# Patient Record
Sex: Female | Born: 1995
Health system: Southern US, Community
[De-identification: ages and names within clinical notes are randomized; demographics above are authoritative.]

## PROBLEM LIST (undated history)

## (undated) DIAGNOSIS — D509 Iron deficiency anemia, unspecified: Secondary | ICD-10-CM

## (undated) DIAGNOSIS — I10 Essential (primary) hypertension: Secondary | ICD-10-CM

## (undated) HISTORY — DX: Iron deficiency anemia, unspecified: D50.9

## (undated) HISTORY — DX: Essential (primary) hypertension: I10

## (undated) HISTORY — PX: NO PAST SURGERIES: SHX2092

---
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
## 2016-04-08 DIAGNOSIS — Z113 Encounter for screening for infections with a predominantly sexual mode of transmission: Secondary | ICD-10-CM | POA: Diagnosis not present

## 2016-04-08 DIAGNOSIS — Z118 Encounter for screening for other infectious and parasitic diseases: Secondary | ICD-10-CM | POA: Diagnosis not present

## 2016-04-08 DIAGNOSIS — Z01419 Encounter for gynecological examination (general) (routine) without abnormal findings: Secondary | ICD-10-CM | POA: Diagnosis not present

## 2016-11-16 DIAGNOSIS — Z6841 Body Mass Index (BMI) 40.0 and over, adult: Secondary | ICD-10-CM | POA: Diagnosis not present

## 2016-11-16 DIAGNOSIS — R635 Abnormal weight gain: Secondary | ICD-10-CM | POA: Diagnosis not present

## 2016-11-16 DIAGNOSIS — I10 Essential (primary) hypertension: Secondary | ICD-10-CM | POA: Diagnosis not present

## 2016-11-17 MED FILL — QSYMIA 3.75 MG-23 MG CAP: 3.75-23 | 14 days supply | Qty: 14 | Fill #0

## 2016-12-07 MED FILL — PHENTERMINE 15 MG CAPSULE: 15 | 30 days supply | Qty: 30 | Fill #0

## 2016-12-27 DIAGNOSIS — I10 Essential (primary) hypertension: Secondary | ICD-10-CM | POA: Diagnosis not present

## 2016-12-27 DIAGNOSIS — Z6841 Body Mass Index (BMI) 40.0 and over, adult: Secondary | ICD-10-CM | POA: Diagnosis not present

## 2016-12-30 MED FILL — PHENTERMINE 37.5 MG TABLET: 37.5 | 30 days supply | Qty: 30 | Fill #0

## 2017-01-03 DIAGNOSIS — Z124 Encounter for screening for malignant neoplasm of cervix: Secondary | ICD-10-CM | POA: Diagnosis not present

## 2017-01-03 DIAGNOSIS — Z01419 Encounter for gynecological examination (general) (routine) without abnormal findings: Secondary | ICD-10-CM | POA: Diagnosis not present

## 2017-01-03 DIAGNOSIS — Z6841 Body Mass Index (BMI) 40.0 and over, adult: Secondary | ICD-10-CM | POA: Diagnosis not present

## 2017-01-03 DIAGNOSIS — Z309 Encounter for contraceptive management, unspecified: Secondary | ICD-10-CM | POA: Diagnosis not present

## 2017-01-03 MED FILL — NORETHIN-ESTRAD-FERR 1-0.02: 1-20 | 84 days supply | Qty: 84 | Fill #0

## 2017-01-06 MED FILL — metroNIDAZOLE 500 MG TABS: 500 | 7 days supply | Qty: 14 | Fill #0

## 2017-01-13 DIAGNOSIS — H52223 Regular astigmatism, bilateral: Secondary | ICD-10-CM | POA: Diagnosis not present

## 2017-02-08 MED FILL — PHENTERMINE 37.5 MG TABLET: 37.5 | 30 days supply | Qty: 30 | Fill #1

## 2017-03-01 DIAGNOSIS — Z6841 Body Mass Index (BMI) 40.0 and over, adult: Secondary | ICD-10-CM | POA: Diagnosis not present

## 2017-03-01 DIAGNOSIS — I10 Essential (primary) hypertension: Secondary | ICD-10-CM | POA: Diagnosis not present

## 2017-04-04 MED FILL — QSYMIA 7.5 MG-46 MG CAPSULE: 7.5-46 | 30 days supply | Qty: 30 | Fill #0

## 2017-04-05 MED FILL — NORETHIN-ESTRAD-FERR 1-0.02: 1-20 | 84 days supply | Qty: 84 | Fill #0

## 2017-05-16 DIAGNOSIS — I10 Essential (primary) hypertension: Secondary | ICD-10-CM | POA: Diagnosis not present

## 2017-05-16 DIAGNOSIS — Z6841 Body Mass Index (BMI) 40.0 and over, adult: Secondary | ICD-10-CM | POA: Diagnosis not present

## 2017-05-16 DIAGNOSIS — R51 Headache: Secondary | ICD-10-CM | POA: Diagnosis not present

## 2017-05-16 MED FILL — HYDROCHLOROTHIAZIDE 12.5 MG: 12.5 | 30 days supply | Qty: 30 | Fill #0

## 2017-05-16 MED FILL — QSYMIA 7.5 MG-46 MG CAPSULE: 7.5-46 | 30 days supply | Qty: 30 | Fill #0

## 2017-06-19 DIAGNOSIS — R87612 Low grade squamous intraepithelial lesion on cytologic smear of cervix (LGSIL): Secondary | ICD-10-CM | POA: Diagnosis not present

## 2017-06-19 DIAGNOSIS — Z3040 Encounter for surveillance of contraceptives, unspecified: Secondary | ICD-10-CM | POA: Diagnosis not present

## 2017-06-19 DIAGNOSIS — R87613 High grade squamous intraepithelial lesion on cytologic smear of cervix (HGSIL): Secondary | ICD-10-CM | POA: Diagnosis not present

## 2017-06-19 DIAGNOSIS — N898 Other specified noninflammatory disorders of vagina: Secondary | ICD-10-CM | POA: Diagnosis not present

## 2017-06-19 DIAGNOSIS — Z8249 Family history of ischemic heart disease and other diseases of the circulatory system: Secondary | ICD-10-CM | POA: Diagnosis not present

## 2017-06-19 DIAGNOSIS — I10 Essential (primary) hypertension: Secondary | ICD-10-CM | POA: Diagnosis not present

## 2017-06-19 MED FILL — DASETTA 1-35-28 TABLET: 1-35 | 28 days supply | Qty: 28 | Fill #0

## 2017-06-19 MED FILL — NORETHIN-ESTRAD-FERR 1-0.02: 1-20 | 84 days supply | Qty: 112 | Fill #0

## 2017-06-22 MED FILL — metroNIDAZOLE 0.75 % GEL: 0.75 | 5 days supply | Qty: 70 | Fill #0

## 2017-07-06 DIAGNOSIS — D509 Iron deficiency anemia, unspecified: Secondary | ICD-10-CM | POA: Diagnosis not present

## 2017-07-06 DIAGNOSIS — Z6841 Body Mass Index (BMI) 40.0 and over, adult: Secondary | ICD-10-CM | POA: Diagnosis not present

## 2017-07-06 DIAGNOSIS — I10 Essential (primary) hypertension: Secondary | ICD-10-CM | POA: Diagnosis not present

## 2017-08-29 DIAGNOSIS — Z712 Person consulting for explanation of examination or test findings: Secondary | ICD-10-CM | POA: Diagnosis not present

## 2017-08-29 DIAGNOSIS — N920 Excessive and frequent menstruation with regular cycle: Secondary | ICD-10-CM | POA: Diagnosis not present

## 2017-08-29 DIAGNOSIS — Z3041 Encounter for surveillance of contraceptive pills: Secondary | ICD-10-CM | POA: Diagnosis not present

## 2017-08-29 DIAGNOSIS — I10 Essential (primary) hypertension: Secondary | ICD-10-CM | POA: Diagnosis not present

## 2017-08-29 MED FILL — HYDROCHLOROTHIAZIDE 12.5 MG: 12.5 | 90 days supply | Qty: 90 | Fill #0

## 2017-11-08 MED FILL — PHENTERMINE 15 MG CAPSULE: 15 | 30 days supply | Qty: 30 | Fill #0

## 2017-12-13 MED FILL — PHENTERMINE 37.5 MG TABLET: 37.5 | 30 days supply | Qty: 30 | Fill #0

## 2018-01-09 MED FILL — LARIN FE 1-20 TABLET: 1-20 | 84 days supply | Qty: 84 | Fill #0

## 2018-01-10 MED FILL — PHENTERMINE 37.5 MG TABLET: 37.5 | 30 days supply | Qty: 30 | Fill #0

## 2018-02-12 MED FILL — AMOXICILLIN 500 MG CAPSULE: 500 | 7 days supply | Qty: 21 | Fill #0

## 2018-02-12 MED FILL — FLUCONAZOLE 150 MG TABLET: 150 | 1 days supply | Qty: 1 | Fill #0

## 2018-03-01 MED FILL — PHENTERMINE 37.5 MG TABLET: 37.5 | 30 days supply | Qty: 30 | Fill #1

## 2018-03-25 ENCOUNTER — Emergency Department (HOSPITAL_COMMUNITY): Admission: EM | Admit: 2018-03-25 | Discharge: 2018-03-25 | Discharge status: Against Medical Advice | Payer: Self-pay

## 2018-03-25 NOTE — ED Notes (Signed)
Pt decided she does not want to wait to be triaged. Pt. Wants to leave. I informed pt to return if she feels like her mouth injury gets worse

## 2018-04-09 MED FILL — PHENTERMINE 37.5 MG TABLET: 37.5 | 30 days supply | Qty: 30 | Fill #1

## 2018-04-26 MED FILL — IBUPROFEN 600 MG TABLET: 600 | 5 days supply | Qty: 20 | Fill #0

## 2018-05-09 LAB — HEPATIC FUNCTION PANEL
ALK PHOS: 76 (ref 25–125)
ALT: 6 — AB (ref 7–35)
AST: 11 — AB (ref 13–35)
BILIRUBIN, TOTAL: 0.4

## 2018-05-09 LAB — CBC AND DIFFERENTIAL
HEMATOCRIT: 35 — AB (ref 36–46)
HEMOGLOBIN: 10.8 — AB (ref 12.0–16.0)
NEUTROS ABS: 5
PLATELETS: 531 — AB (ref 150–399)
WBC: 7.5

## 2018-05-09 LAB — BASIC METABOLIC PANEL
BUN: 7 (ref 4–21)
CREATININE: 0.8 (ref 0.5–1.1)
Glucose: 82
Potassium: 4.1 (ref 3.4–5.3)
Sodium: 139 (ref 137–147)

## 2018-05-09 LAB — IRON,TIBC AND FERRITIN PANEL
Ferritin: 13
Iron: 52
TIBC: 328

## 2018-05-15 MED FILL — QSYMIA 7.5 MG-46 MG CAPSULE: 7.5-46 | 30 days supply | Qty: 30 | Fill #0

## 2018-09-07 MED FILL — QSYMIA 7.5 MG-46 MG CAPSULE: 7.5-46 | 30 days supply | Qty: 30 | Fill #1

## 2018-09-08 ENCOUNTER — Encounter: Payer: Self-pay | Admitting: Internal Medicine

## 2018-09-08 DIAGNOSIS — Z0000 Encounter for general adult medical examination without abnormal findings: Secondary | ICD-10-CM

## 2018-09-10 ENCOUNTER — Ambulatory Visit: Payer: Self-pay | Admitting: Internal Medicine

## 2018-09-13 ENCOUNTER — Ambulatory Visit (INDEPENDENT_AMBULATORY_CARE_PROVIDER_SITE_OTHER): Payer: No Typology Code available for payment source | Admitting: Internal Medicine

## 2018-09-13 ENCOUNTER — Encounter: Payer: Self-pay | Admitting: Internal Medicine

## 2018-09-13 VITALS — BP 122/78 | HR 79 | Temp 98.2°F | Ht 60.0 in | Wt 230.4 lb

## 2018-09-13 DIAGNOSIS — D500 Iron deficiency anemia secondary to blood loss (chronic): Secondary | ICD-10-CM

## 2018-09-13 DIAGNOSIS — R7309 Other abnormal glucose: Secondary | ICD-10-CM | POA: Diagnosis not present

## 2018-09-13 DIAGNOSIS — I10 Essential (primary) hypertension: Secondary | ICD-10-CM | POA: Diagnosis not present

## 2018-09-13 DIAGNOSIS — Z6841 Body Mass Index (BMI) 40.0 and over, adult: Secondary | ICD-10-CM

## 2018-09-13 NOTE — Patient Instructions (Signed)
Exercising to Lose Weight Exercising can help you to lose weight. In order to lose weight through exercise, you need to do vigorous-intensity exercise. You can tell that you are exercising with vigorous intensity if you are breathing very hard and fast and cannot hold a conversation while exercising. Moderate-intensity exercise helps to maintain your current weight. You can tell that you are exercising at a moderate level if you have a higher heart rate and faster breathing, but you are still able to hold a conversation. How often should I exercise? Choose an activity that you enjoy and set realistic goals. Your health care provider can help you to make an activity plan that works for you. Exercise regularly as directed by your health care provider. This may include:  Doing resistance training twice each week, such as: ? Push-ups. ? Sit-ups. ? Lifting weights. ? Using resistance bands.  Doing a given intensity of exercise for a given amount of time. Choose from these options: ? 150 minutes of moderate-intensity exercise every week. ? 75 minutes of vigorous-intensity exercise every week. ? A mix of moderate-intensity and vigorous-intensity exercise every week.  Children, pregnant women, people who are out of shape, people who are overweight, and older adults may need to consult a health care provider for individual recommendations. If you have any sort of medical condition, be sure to consult your health care provider before starting a new exercise program. What are some activities that can help me to lose weight?  Walking at a rate of at least 4.5 miles an hour.  Jogging or running at a rate of 5 miles per hour.  Biking at a rate of at least 10 miles per hour.  Lap swimming.  Roller-skating or in-line skating.  Cross-country skiing.  Vigorous competitive sports, such as football, basketball, and soccer.  Jumping rope.  Aerobic dancing. How can I be more active in my day-to-day  activities?  Use the stairs instead of the elevator.  Take a walk during your lunch break.  If you drive, park your car farther away from work or school.  If you take public transportation, get off one stop early and walk the rest of the way.  Make all of your phone calls while standing up and walking around.  Get up, stretch, and walk around every 30 minutes throughout the day. What guidelines should I follow while exercising?  Do not exercise so much that you hurt yourself, feel dizzy, or get very short of breath.  Consult your health care provider prior to starting a new exercise program.  Wear comfortable clothes and shoes with good support.  Drink plenty of water while you exercise to prevent dehydration or heat stroke. Body water is lost during exercise and must be replaced.  Work out until you breathe faster and your heart beats faster. This information is not intended to replace advice given to you by your health care provider. Make sure you discuss any questions you have with your health care provider. Document Released: 11/26/2010 Document Revised: 03/31/2016 Document Reviewed: 03/27/2014 Elsevier Interactive Patient Education  2018 Elsevier Inc.  

## 2018-09-13 NOTE — Progress Notes (Signed)
  Subjective:     Patient ID: Angel Byrd , female    DOB: [birth year] , 22 y.o.   MRN: [id number]   Chief Complaint  Patient presents with  . Hypertension    HPI  Hypertension  This is a chronic problem. The current episode started more than 1 month ago. The problem is unchanged. The problem is controlled. Pertinent negatives include no blurred vision, chest pain, headaches, neck pain, orthopnea, palpitations or shortness of breath.     History reviewed. No pertinent past medical history.   Family History  Problem Relation Age of Onset  . Sickle cell anemia Mother   . Heart disease Father   . Hypertension Sister      Current Outpatient Medications:  .  Fe Fum-Fe Poly-Vit C-Lactobac (FUSION PO), Take by mouth., Disp: , Rfl:  .  nebivolol (BYSTOLIC) 5 MG tablet, Take 5 mg by mouth daily., Disp: , Rfl:  .  Phentermine-Topiramate (QSYMIA) 7.5-46 MG CP24, Take by mouth., Disp: , Rfl:    No Known Allergies   Review of Systems  Constitutional: Negative.   HENT: Negative.   Eyes: Negative for blurred vision.  Respiratory: Negative.  Negative for shortness of breath.   Cardiovascular: Negative.  Negative for chest pain, palpitations and orthopnea.  Gastrointestinal: Negative.   Musculoskeletal: Negative for neck pain.  Neurological: Negative.  Negative for headaches.  Psychiatric/Behavioral: Negative.      Today's Vitals   09/13/18 1118  BP: 122/78  Pulse: 79  Temp: 98.2 F (36.8 C)  TempSrc: Oral  Weight: 230 lb 6.4 oz (104.5 kg)  Height: 5' (1.524 m)   Body mass index is 45 kg/m.   Objective:  Physical Exam  Constitutional: She is oriented to person, place, and time. She appears well-developed and well-nourished.  HENT:  Head: Normocephalic and atraumatic.  Cardiovascular: Normal rate, regular rhythm and normal heart sounds.  Pulmonary/Chest: Effort normal and breath sounds normal.  Neurological: She is alert and oriented to person, place, and time.   Skin: Skin is warm and dry.  Psychiatric: She has a normal mood and affect.  Nursing note and vitals reviewed.       Assessment And Plan:     1. Essential hypertension, benign  Well controlled. She will continue with current meds. She will RTO in six months for a full physical examination.   - CMP14+EGFR - Hemoglobin A1c  2. Iron deficiency anemia due to chronic blood loss  I will check her iron studies today. Unfortunately, I do not think she has been compliant with oral iron supplementation.   - CBC no Diff - Iron and IBC ([id number])  3. Other abnormal glucose  HER A1C HAS BEEN ELEVATED IN THE PAST. I WILL CHECK AN A1C, BMET TODAY. SHE WAS ENCOURAGED TO AVOID SUGARY BEVERAGES AND PROCESSED FOODS INCLUDNG BREADS, RICE AND PASTA.   4. Class 3 severe obesity due to excess calories with serious comorbidity and body mass index (BMI) of 40.0 to 44.9 in adult [facility])  She is interested in bariatric surgery. She is encouraged to contact [facility] to sign up for their orientation class.   She is encouraged to strive for BMI less than 35 to decrease cardiac risk    [name], MD

## 2018-09-14 LAB — CMP14+EGFR
ALK PHOS: 80 IU/L (ref 39–117)
ALT: 7 IU/L (ref 0–32)
AST: 11 IU/L (ref 0–40)
Albumin/Globulin Ratio: 1.3 (ref 1.2–2.2)
Albumin: 4.4 g/dL (ref 3.5–5.5)
BILIRUBIN TOTAL: 0.2 mg/dL (ref 0.0–1.2)
BUN/Creatinine Ratio: 11 (ref 9–23)
BUN: 9 mg/dL (ref 6–20)
CHLORIDE: 107 mmol/L — AB (ref 96–106)
CO2: 17 mmol/L — ABNORMAL LOW (ref 20–29)
Calcium: 9.1 mg/dL (ref 8.7–10.2)
Creatinine, Ser: 0.84 mg/dL (ref 0.57–1.00)
GFR calc Af Amer: 113 mL/min/{1.73_m2} (ref >59)
GFR calc non Af Amer: 98 mL/min/{1.73_m2} (ref >59)
GLUCOSE: 81 mg/dL (ref 65–99)
Globulin, Total: 3.3 g/dL (ref 1.5–4.5)
Potassium: 4.1 mmol/L (ref 3.5–5.2)
Sodium: 141 mmol/L (ref 134–144)
Total Protein: 7.7 g/dL (ref 6.0–8.5)

## 2018-09-14 LAB — IRON AND TIBC
IRON SATURATION: 7 % — AB (ref 15–55)
IRON: 22 ug/dL — AB (ref 27–159)
Total Iron Binding Capacity: 320 ug/dL (ref 250–450)
UIBC: 298 ug/dL (ref 131–425)

## 2018-09-14 LAB — CBC
Hematocrit: 31.8 % — ABNORMAL LOW (ref 34.0–46.6)
Hemoglobin: 9.8 g/dL — ABNORMAL LOW (ref 11.1–15.9)
MCH: 22.7 pg — AB (ref 26.6–33.0)
MCHC: 30.8 g/dL — ABNORMAL LOW (ref 31.5–35.7)
MCV: 74 fL — ABNORMAL LOW (ref 79–97)
PLATELETS: 559 10*3/uL — AB (ref 150–450)
RBC: 4.32 x10E6/uL (ref 3.77–5.28)
RDW: 15.3 % (ref 12.3–15.4)
WBC: 10.8 10*3/uL (ref 3.4–10.8)

## 2018-09-14 LAB — HEMOGLOBIN A1C
Est. average glucose Bld gHb Est-mCnc: 114 mg/dL
HEMOGLOBIN A1C: 5.6 % (ref 4.8–5.6)

## 2018-09-15 ENCOUNTER — Telehealth: Payer: Self-pay

## 2018-09-15 NOTE — Telephone Encounter (Signed)
Called the pt with her lab results and she hung up and said she will call back.

## 2018-09-15 NOTE — Telephone Encounter (Signed)
-----   Message from Glendale Chard, MD sent at 09/14/2018  8:46 PM EST ----- Your chloride levels are elevated - pls increase water intake. Your hba1c is 5.6, you are not prediabetic. Your blood count is lower than last visit. Your platelets are extremely elevated. Your iron levels are quite low. I would like to refer you to a blood specialist, hematologist. If your platelet levels get too high, it can increase your risk of clotting. KW-if she agrees, let me know and I will place referral.thx

## 2018-09-16 ENCOUNTER — Encounter: Payer: Self-pay | Admitting: Internal Medicine

## 2018-09-19 ENCOUNTER — Other Ambulatory Visit: Payer: Self-pay | Admitting: Internal Medicine

## 2018-09-19 DIAGNOSIS — D473 Essential (hemorrhagic) thrombocythemia: Secondary | ICD-10-CM

## 2018-09-19 DIAGNOSIS — D500 Iron deficiency anemia secondary to blood loss (chronic): Secondary | ICD-10-CM

## 2018-09-19 DIAGNOSIS — D75839 Thrombocytosis, unspecified: Secondary | ICD-10-CM

## 2018-09-24 ENCOUNTER — Telehealth: Payer: Self-pay | Admitting: Family

## 2018-09-24 NOTE — Telephone Encounter (Signed)
lmom for pt to return call to office re New Patient appt. appt letter mailed 11/18

## 2018-10-16 ENCOUNTER — Other Ambulatory Visit: Payer: Self-pay

## 2018-10-16 ENCOUNTER — Ambulatory Visit: Payer: Self-pay

## 2018-10-16 ENCOUNTER — Ambulatory Visit: Payer: Self-pay | Admitting: Family

## 2018-10-19 ENCOUNTER — Other Ambulatory Visit: Payer: Self-pay | Admitting: Family

## 2018-10-19 ENCOUNTER — Telehealth: Payer: Self-pay | Admitting: Family

## 2018-10-19 DIAGNOSIS — D649 Anemia, unspecified: Secondary | ICD-10-CM

## 2018-10-19 NOTE — Telephone Encounter (Signed)
Returned call to patient requesting to cancel 12/16 NP appt.  I LMVM for her advising that appts were cancelled and she needed to CB to r/s

## 2018-10-22 ENCOUNTER — Inpatient Hospital Stay: Payer: Self-pay

## 2018-10-22 ENCOUNTER — Inpatient Hospital Stay: Payer: Self-pay | Admitting: Family

## 2018-10-22 ENCOUNTER — Ambulatory Visit: Payer: Self-pay

## 2018-11-13 ENCOUNTER — Ambulatory Visit (INDEPENDENT_AMBULATORY_CARE_PROVIDER_SITE_OTHER): Payer: No Typology Code available for payment source | Admitting: Internal Medicine

## 2018-11-13 ENCOUNTER — Encounter: Payer: Self-pay | Admitting: Internal Medicine

## 2018-11-13 VITALS — BP 122/84 | HR 70 | Temp 98.3°F | Ht 60.0 in | Wt 235.4 lb

## 2018-11-13 DIAGNOSIS — Z79899 Other long term (current) drug therapy: Secondary | ICD-10-CM

## 2018-11-13 DIAGNOSIS — I10 Essential (primary) hypertension: Secondary | ICD-10-CM

## 2018-11-13 DIAGNOSIS — Z6841 Body Mass Index (BMI) 40.0 and over, adult: Secondary | ICD-10-CM

## 2018-11-13 DIAGNOSIS — Z9114 Patient's other noncompliance with medication regimen: Secondary | ICD-10-CM

## 2018-11-13 DIAGNOSIS — D509 Iron deficiency anemia, unspecified: Secondary | ICD-10-CM | POA: Diagnosis not present

## 2018-11-13 LAB — POCT URINE PREGNANCY: Preg Test, Ur: NEGATIVE

## 2018-11-13 MED ORDER — PHENTERMINE-TOPIRAMATE 11.25-69 MG PO CP24
11.2500 mg | ORAL_CAPSULE | Freq: Every day | ORAL | 1 refills | Status: DC
Start: 2018-11-13 — End: 2018-11-30

## 2018-11-13 NOTE — Patient Instructions (Signed)
Iron Deficiency Anemia, Adult  Iron-deficiency anemia is when you have a low amount of red blood cells or hemoglobin. This happens because you have too little iron in your body. Hemoglobin carries oxygen to parts of the body. Anemia can cause your body to not get enough oxygen. It may or may not cause symptoms.  Follow these instructions at home:  Medicines  · Take over-the-counter and prescription medicines only as told by your doctor. This includes iron pills (supplements) and vitamins.  · If you cannot handle taking iron pills by mouth, ask your doctor about getting iron through:  ? A vein (intravenously).  ? A shot (injection) into a muscle.  · Take iron pills when your stomach is empty. If you cannot handle this, take them with food.  · Do not drink milk or take antacids at the same time as your iron pills.  · To prevent trouble pooping (constipation), eat fiber or take medicine (stool softener) as told by your doctor.  Eating and drinking    · Talk with your doctor before changing the foods you eat. He or she may tell you to eat foods that have a lot of iron, such as:  ? Liver.  ? Lowfat (lean) beef.  ? Breads and cereals that have iron added to them (fortified breads and cereals).  ? Eggs.  ? Dried fruit.  ? Dark green, leafy vegetables.  · Drink enough fluid to keep your pee (urine) clear or pale yellow.  · Eat fresh fruits and vegetables that are high in vitamin C. They help your body to use iron. Foods with a lot of vitamin C include:  ? Oranges.  ? Peppers.  ? Tomatoes.  ? Mangoes.  General instructions  · Return to your normal activities as told by your doctor. Ask your doctor what activities are safe for you.  · Keep yourself clean, and keep things clean around you (your surroundings). Anemia can make you get sick more easily.  · Keep all follow-up visits as told by your doctor. This is important.  Contact a doctor if:  · You feel sick to your stomach (nauseous).  · You throw up (vomit).  · You feel  weak.  · You are sweating for no clear reason.  · You have trouble pooping, such as:  ? Pooping (having a bowel movement) less than 3 times a week.  ? Straining to poop.  ? Having poop that is hard, dry, or larger than normal.  ? Feeling full or bloated.  ? Pain in the lower belly.  ? Not feeling better after pooping.  Get help right away if:  · You pass out (faint). If this happens, do not drive yourself to the hospital. Call your local emergency services (911 in the [place]  · You have chest pain.  · You have shortness of breath that:  ? Is very bad.  ? Gets worse with physical activity.  · You have a fast heartbeat.  · You get light-headed when getting up from sitting or lying down.  This information is not intended to replace advice given to you by your health care provider. Make sure you discuss any questions you have with your health care provider.  Document Released: 11/26/2010 Document Revised: 07/13/2016 Document Reviewed: 07/13/2016  Elsevier Interactive Patient Education © 2019 Elsevier Inc.

## 2018-11-14 LAB — CBC
HEMOGLOBIN: 9.7 g/dL — AB (ref 11.1–15.9)
Hematocrit: 30.6 % — ABNORMAL LOW (ref 34.0–46.6)
MCH: 22.2 pg — ABNORMAL LOW (ref 26.6–33.0)
MCHC: 31.7 g/dL (ref 31.5–35.7)
MCV: 70 fL — AB (ref 79–97)
PLATELETS: 590 10*3/uL — AB (ref 150–450)
RBC: 4.37 x10E6/uL (ref 3.77–5.28)
RDW: 16.8 % — ABNORMAL HIGH (ref 11.7–15.4)
WBC: 8.3 10*3/uL (ref 3.4–10.8)

## 2018-11-14 LAB — IRON AND TIBC
IRON: 34 ug/dL (ref 27–159)
Iron Saturation: 11 % — ABNORMAL LOW (ref 15–55)
TIBC: 306 ug/dL (ref 250–450)
UIBC: 272 ug/dL (ref 131–425)

## 2018-11-14 LAB — CMP14+EGFR
A/G RATIO: 1.3 (ref 1.2–2.2)
ALK PHOS: 80 IU/L (ref 39–117)
ALT: 5 IU/L (ref 0–32)
AST: 8 IU/L (ref 0–40)
Albumin: 4.2 g/dL (ref 3.5–5.5)
BUN/Creatinine Ratio: 7 — ABNORMAL LOW (ref 9–23)
BUN: 5 mg/dL — ABNORMAL LOW (ref 6–20)
Bilirubin Total: 0.4 mg/dL (ref 0.0–1.2)
CO2: 21 mmol/L (ref 20–29)
Calcium: 9 mg/dL (ref 8.7–10.2)
Chloride: 102 mmol/L (ref 96–106)
Creatinine, Ser: 0.75 mg/dL (ref 0.57–1.00)
GFR calc Af Amer: 129 mL/min/{1.73_m2} (ref >59)
GFR calc non Af Amer: 112 mL/min/{1.73_m2} (ref >59)
GLOBULIN, TOTAL: 3.3 g/dL (ref 1.5–4.5)
Glucose: 74 mg/dL (ref 65–99)
POTASSIUM: 4.1 mmol/L (ref 3.5–5.2)
SODIUM: 139 mmol/L (ref 134–144)
Total Protein: 7.5 g/dL (ref 6.0–8.5)

## 2018-11-14 LAB — FERRITIN: FERRITIN: 10 ng/mL — AB (ref 15–150)

## 2018-11-14 LAB — HIV ANTIBODY (ROUTINE TESTING W REFLEX): HIV Screen 4th Generation wRfx: NONREACTIVE

## 2018-11-14 LAB — HEMOGLOBIN A1C
Est. average glucose Bld gHb Est-mCnc: 111 mg/dL
HEMOGLOBIN A1C: 5.5 % (ref 4.8–5.6)

## 2018-11-25 ENCOUNTER — Encounter: Payer: Self-pay | Admitting: Internal Medicine

## 2018-11-25 NOTE — Progress Notes (Signed)
  Subjective:     Patient ID: Angel Byrd , female    DOB: Apr 20, 1996 , 23 y.o.   MRN: [id number]   Chief Complaint  Patient presents with  . Hypertension    HPI  Hypertension  This is a chronic problem. The current episode started more than 1 year ago. The problem has been gradually improving since onset. The problem is controlled. Pertinent negatives include no blurred vision or chest pain.   she reports compliance with meds.   Past Medical History:  Diagnosis Date  . HTN (hypertension)   . Iron deficiency anemia      Family History  Problem Relation Age of Onset  . Sickle cell anemia Mother   . Heart disease Father   . Hypertension Sister      Current Outpatient Medications:  .  Fe Fum-Fe Poly-Vit C-Lactobac (FUSION PO), Take by mouth., Disp: , Rfl:  .  nebivolol (BYSTOLIC) 5 MG tablet, Take 5 mg by mouth daily., Disp: , Rfl:  .  Phentermine-Topiramate (QSYMIA) 11.25-69 MG CP24, Take 11.25 mg by mouth daily., Disp: 30 capsule, Rfl: 1   No Known Allergies   Review of Systems  Constitutional: Negative.   Eyes: Negative for blurred vision.  Respiratory: Negative.   Cardiovascular: Negative.  Negative for chest pain.  Gastrointestinal: Negative.   Neurological: Negative.   Psychiatric/Behavioral: Negative.      Today's Vitals   11/13/18 1123  BP: 122/84  Pulse: 70  Temp: 98.3 F (36.8 C)  TempSrc: Oral  Weight: 235 lb 6.4 oz (106.8 kg)  Height: 5' (1.524 m)   Body mass index is 45.97 kg/m.   Objective:  Physical Exam Vitals signs and nursing note reviewed.  Constitutional:      Appearance: Normal appearance. She is obese.  HENT:     Head: Normocephalic and atraumatic.  Cardiovascular:     Rate and Rhythm: Normal rate and regular rhythm.     Heart sounds: Normal heart sounds.  Pulmonary:     Effort: Pulmonary effort is normal.     Breath sounds: Normal breath sounds.  Skin:    General: Skin is warm.  Neurological:     Mental Status: She is  alert.  Psychiatric:        Mood and Affect: Mood normal.         Assessment And Plan:     1. Essential hypertension, benign  Well controlled. She will continue with current meds. She is encouraged to avoid adding salt to her foods.   - CMP14+EGFR - Hemoglobin A1c - HIV antibody (with reflex)  2. Iron deficiency anemia, unspecified iron deficiency anemia type  I will check labs as listed below. Unfortunately, she has not been compliant with oral iron supplementation. She may benefit from iron infusion. I will refer her to hematology for further evaluation.   - CBC no Diff - Iron and IBC ([id number]) - Ferritin - Ambulatory referral to Hematology  3. Class 3 severe obesity due to excess calories with serious comorbidity and body mass index (BMI) of 45.0 to 49.9 in adult [facility])  She has not lost any weight since her last visit. She is encouraged to incorporate more exercise into her daily routine. She is advised to exercise 30 minutes five days weekly. I will also increase the dose of her Qsymia.   4. Long term use of drug  - POCT urine pregnancy        [name], MD

## 2018-11-27 ENCOUNTER — Telehealth: Payer: Self-pay | Admitting: Family

## 2018-11-27 NOTE — Telephone Encounter (Signed)
Spoke with patient to confirm new patient appt 12/10/2018 at 1030 am

## 2018-11-30 ENCOUNTER — Other Ambulatory Visit: Payer: Self-pay

## 2018-11-30 ENCOUNTER — Other Ambulatory Visit: Payer: Self-pay | Admitting: Internal Medicine

## 2018-11-30 MED ORDER — PHENTERMINE-TOPIRAMATE 11.25-69 MG PO CP24
11.2500 mg | ORAL_CAPSULE | Freq: Every day | ORAL | 1 refills | Status: DC
Start: 2018-11-30 — End: 2018-12-31

## 2018-11-30 NOTE — Telephone Encounter (Signed)
The pt needs her prescription for qsymia sent to the  [facility] because she can't use her [employer] card a [facility].

## 2018-12-10 ENCOUNTER — Inpatient Hospital Stay: Payer: Self-pay

## 2018-12-10 ENCOUNTER — Inpatient Hospital Stay: Payer: Self-pay | Admitting: Family

## 2018-12-11 ENCOUNTER — Telehealth: Payer: Self-pay

## 2018-12-11 NOTE — Telephone Encounter (Signed)
Approved for qsymia

## 2018-12-31 ENCOUNTER — Inpatient Hospital Stay (HOSPITAL_BASED_OUTPATIENT_CLINIC_OR_DEPARTMENT_OTHER): Payer: No Typology Code available for payment source | Admitting: Family

## 2018-12-31 ENCOUNTER — Encounter: Payer: Self-pay | Admitting: Family

## 2018-12-31 ENCOUNTER — Inpatient Hospital Stay: Payer: No Typology Code available for payment source | Attending: Hematology & Oncology

## 2018-12-31 ENCOUNTER — Other Ambulatory Visit: Payer: Self-pay

## 2018-12-31 VITALS — BP 125/78 | HR 94 | Temp 98.5°F | Resp 18 | Ht 60.0 in | Wt 227.0 lb

## 2018-12-31 DIAGNOSIS — Z8249 Family history of ischemic heart disease and other diseases of the circulatory system: Secondary | ICD-10-CM | POA: Diagnosis not present

## 2018-12-31 DIAGNOSIS — R1310 Dysphagia, unspecified: Secondary | ICD-10-CM | POA: Insufficient documentation

## 2018-12-31 DIAGNOSIS — D573 Sickle-cell trait: Secondary | ICD-10-CM | POA: Insufficient documentation

## 2018-12-31 DIAGNOSIS — Z79899 Other long term (current) drug therapy: Secondary | ICD-10-CM

## 2018-12-31 DIAGNOSIS — D509 Iron deficiency anemia, unspecified: Secondary | ICD-10-CM | POA: Insufficient documentation

## 2018-12-31 DIAGNOSIS — D500 Iron deficiency anemia secondary to blood loss (chronic): Secondary | ICD-10-CM

## 2018-12-31 DIAGNOSIS — D649 Anemia, unspecified: Secondary | ICD-10-CM

## 2018-12-31 LAB — CBC WITH DIFFERENTIAL (CANCER CENTER ONLY)
Abs Immature Granulocytes: 0.02 10*3/uL (ref 0.00–0.07)
BASOS ABS: 0 10*3/uL (ref 0.0–0.1)
Basophils Relative: 1 %
EOS ABS: 0.1 10*3/uL (ref 0.0–0.5)
Eosinophils Relative: 1 %
HCT: 33.2 % — ABNORMAL LOW (ref 36.0–46.0)
Hemoglobin: 10.2 g/dL — ABNORMAL LOW (ref 12.0–15.0)
IMMATURE GRANULOCYTES: 0 %
Lymphocytes Relative: 31 %
Lymphs Abs: 2.4 10*3/uL (ref 0.7–4.0)
MCH: 22.3 pg — ABNORMAL LOW (ref 26.0–34.0)
MCHC: 30.7 g/dL (ref 30.0–36.0)
MCV: 72.5 fL — ABNORMAL LOW (ref 80.0–100.0)
MONOS PCT: 5 %
Monocytes Absolute: 0.4 10*3/uL (ref 0.1–1.0)
NEUTROS ABS: 4.8 10*3/uL (ref 1.7–7.7)
Neutrophils Relative %: 62 %
Platelet Count: 561 10*3/uL — ABNORMAL HIGH (ref 150–400)
RBC: 4.58 MIL/uL (ref 3.87–5.11)
RDW: 16.9 % — AB (ref 11.5–15.5)
WBC: 7.7 10*3/uL (ref 4.0–10.5)
nRBC: 0 % (ref 0.0–0.2)

## 2018-12-31 LAB — CMP (CANCER CENTER ONLY)
ALBUMIN: 4.6 g/dL (ref 3.5–5.0)
ALK PHOS: 71 U/L (ref 38–126)
ALT: 6 U/L (ref 0–44)
AST: 9 U/L — AB (ref 15–41)
Anion gap: 9 (ref 5–15)
BILIRUBIN TOTAL: 0.3 mg/dL (ref 0.3–1.2)
BUN: 8 mg/dL (ref 6–20)
CALCIUM: 9.3 mg/dL (ref 8.9–10.3)
CO2: 21 mmol/L — AB (ref 22–32)
CREATININE: 0.98 mg/dL (ref 0.44–1.00)
Chloride: 108 mmol/L (ref 98–111)
GFR, Est AFR Am: >60 mL/min (ref >60)
GFR, Estimated: >60 mL/min (ref >60)
Glucose, Bld: 94 mg/dL (ref 70–99)
Potassium: 3.4 mmol/L — ABNORMAL LOW (ref 3.5–5.1)
SODIUM: 138 mmol/L (ref 135–145)
TOTAL PROTEIN: 7.5 g/dL (ref 6.5–8.1)

## 2018-12-31 LAB — RETICULOCYTES
Immature Retic Fract: 9.2 % (ref 2.3–15.9)
RBC.: 4.58 MIL/uL (ref 3.87–5.11)
RETIC CT PCT: 0.7 % (ref 0.4–3.1)
Retic Count, Absolute: 32.5 10*3/uL (ref 19.0–186.0)

## 2018-12-31 LAB — SAVE SMEAR (SSMR)

## 2018-12-31 LAB — LACTATE DEHYDROGENASE: LDH: 125 U/L (ref 98–192)

## 2018-12-31 MED ORDER — FOLIC ACID 1 MG PO TABS: 1.0000 mg | ORAL_TABLET | Freq: Every day | ORAL | 11 refills | Status: DC

## 2018-12-31 MED FILL — FOLIC ACID 1 MG TABS: 1 | 30 days supply | Qty: 30 | Fill #0

## 2018-12-31 NOTE — Progress Notes (Signed)
Hematology/Oncology Consultation   Name: Angel Byrd      MRN: [id number]    Location: Room/bed info not found  Date: 12/31/2018 Time:1:22 PM   REFERRING PHYSICIAN: Glendale Chard, MD  REASON FOR CONSULT: Iron deficiency anemia    DIAGNOSIS: Iron deficiency anemia   HISTORY OF PRESENT ILLNESS: Ms. Phung is a very pleasant 23 yo African American female with iron deficiency anemia as well as the sickle cell trait.  Her iron saturation in January was 11% and ferritin 10.  She is currently on Fusion Plus iron supplement daily but has trouble swallowing this. She is not on folic acid.  She is symptomatic with fatigue and chills.  Her cycles is regular but heavy 4 out of 5 days. No other bleeding, no bruising or petechiae.  She has never been pregnant, no miscarriages.  Her mother had sickle cell disease.  Her sister also has IDA and the sickle cell trait.  No personal history of cancer. Family history includes: maternal grandmother lung (smoker), maternal uncle - colon and maternal uncle - bone.  She states that she has never had surgery.  No fever, chills, n/v, cough, rash, dizziness, chest pain, palpitations, abdominal pain or changes in bowel or bladder habits.  No swelling, tenderness, numbness or tingling in her extremities.  No falls or syncopal episodes.  No lymphadenopathy.  She has maintained a good appetite and is staying well hydrated. Her weight is stable.  She does not smoke or drink alcoholic beverages.  She walks daily with her dog for exercise.  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   ROS: All other 10 point review of systems is negative.   PAST MEDICAL HISTORY:   Past Medical History:  Diagnosis Date  . HTN (hypertension)   . Iron deficiency anemia     ALLERGIES: Allergies  Allergen Reactions  . Other       MEDICATIONS:  Current Outpatient Medications on File Prior to Visit  Medication Sig Dispense Refill  . Fe Fum-Fe Poly-Vit C-Lactobac  (FUSION PO) Take by mouth.    . nebivolol (BYSTOLIC) 5 MG tablet Take 5 mg by mouth daily.     No current facility-administered medications on file prior to visit.      PAST SURGICAL HISTORY History reviewed. No pertinent surgical history.  FAMILY HISTORY: Family History  Problem Relation Age of Onset  . Sickle cell anemia Mother   . Heart disease Father   . Hypertension Sister     SOCIAL HISTORY:  reports that she has never smoked. She has never used smokeless tobacco. She reports that she does not drink alcohol or use drugs.  PERFORMANCE STATUS: The patient's performance status is 1 - Symptomatic but completely ambulatory  PHYSICAL EXAM: Most Recent Vital Signs: Blood pressure 125/78, pulse 94, temperature 98.5 F (36.9 C), temperature source Oral, resp. rate 18, height 5' (1.524 m), weight 227 lb (103 kg), SpO2 100 %. BP 125/78 (Patient Position: Sitting)   Pulse 94   Temp 98.5 F (36.9 C) (Oral)   Resp 18   Ht 5' (1.524 m)   Wt 227 lb (103 kg)   SpO2 100%   BMI 44.33 kg/m   General Appearance:    Alert, cooperative, no distress, appears stated age  Head:    Normocephalic, without obvious abnormality, atraumatic  Eyes:    PERRL, conjunctiva/corneas clear, EOM's intact, fundi    benign, both eyes        Throat:   Lips, mucosa,  and tongue normal; teeth and gums normal  Neck:   Supple, symmetrical, trachea midline, no adenopathy;    thyroid:  no enlargement/tenderness/nodules; no carotid   bruit or JVD  Back:     Symmetric, no curvature, ROM normal, no CVA tenderness  Lungs:     Clear to auscultation bilaterally, respirations unlabored  Chest Wall:    No tenderness or deformity   Heart:    Regular rate and rhythm, S1 and S2 normal, no murmur, rub   or gallop     Abdomen:     Soft, non-tender, bowel sounds active all four quadrants,    no masses, no organomegaly        Extremities:   Extremities normal, atraumatic, no cyanosis or edema  Pulses:   2+ and  symmetric all extremities  Skin:   Skin color, texture, turgor normal, no rashes or lesions  Lymph nodes:   Cervical, supraclavicular, and axillary nodes normal  Neurologic:   CNII-XII intact, normal strength, sensation and reflexes    throughout    LABORATORY DATA:  Results for orders placed or performed in visit on 12/31/18 (from the past 48 hour(s))  CBC with Differential (Cancer Center Only)     Status: Abnormal   Collection Time: 12/31/18 12:58 PM  Result Value Ref Range   WBC Count 7.7 4.0 - 10.5 K/uL   RBC 4.58 3.87 - 5.11 MIL/uL   Hemoglobin 10.2 (L) 12.0 - 15.0 g/dL    Comment: Reticulocyte Hemoglobin testing may be clinically indicated, consider ordering this additional test [id number]    HCT 33.2 (L) 36.0 - 46.0 %   MCV 72.5 (L) 80.0 - 100.0 fL   MCH 22.3 (L) 26.0 - 34.0 pg   MCHC 30.7 30.0 - 36.0 g/dL   RDW 16.9 (H) 11.5 - 15.5 %   Platelet Count 561 (H) 150 - 400 K/uL   nRBC 0.0 0.0 - 0.2 %   Neutrophils Relative % 62 %   Neutro Abs 4.8 1.7 - 7.7 K/uL   Lymphocytes Relative 31 %   Lymphs Abs 2.4 0.7 - 4.0 K/uL   Monocytes Relative 5 %   Monocytes Absolute 0.4 0.1 - 1.0 K/uL   Eosinophils Relative 1 %   Eosinophils Absolute 0.1 0.0 - 0.5 K/uL   Basophils Relative 1 %   Basophils Absolute 0.0 0.0 - 0.1 K/uL   Immature Granulocytes 0 %   Abs Immature Granulocytes 0.02 0.00 - 0.07 K/uL    Comment: Performed at [facility] at [facility], [address] [place], [place] [address]  Save Smear [facility])     Status: None   Collection Time: 12/31/18 12:58 PM  Result Value Ref Range   Smear Review SMEAR STAINED AND AVAILABLE FOR REVIEW     Comment: Performed at [facility] at [facility], [address] [place], [place] [address]  Reticulocytes     Status: None   Collection Time: 12/31/18 12:58 PM  Result Value Ref Range   Retic Ct Pct 0.7 0.4 - 3.1 %   RBC. 4.58 3.87 - 5.11 MIL/uL   Retic Count, Absolute 32.5  19.0 - 186.0 K/uL   Immature Retic Fract 9.2 2.3 - 15.9 %    Comment: Performed at [facility] at [facility], [address], [place], [place] [address]      RADIOGRAPHY: No results found.     PATHOLOGY: None  ASSESSMENT/PLAN:  Ms. Sponsel is a very pleasant 23 yo African American female with iron deficiency anemia as well as the sickle cell trait. We will see what her iron studies show and determine if she would benefit more from IV iron rather than the daily oral supplement.  We will get her started on folic acid 1 mg PO daily.  Once we have her results we will schedule her follow-up.   All questions were answered and she is in agreement with the plan. She will contact our office with any questions or concerns. We can certainly see her sooner if need be.   She was discussed with and also seen by Dr. Marin Olp and he is in agreement with the aforementioned.   [name]     Addendum: I saw and examined Ms. Tolle with Judson Roch.  I agree with the above assessment.  Her blood smear under the microscope did not look all that remarkable.  She did have microcytic red blood cells.  She had some anisocytosis and poikilocytosis.  I saw no nucleated red blood cells.  There may been a couple target cells.  White blood cells appeared adequate.  Platelets looked okay.  I am surprised that she has not had her gallbladder out.  Even with sickle cell trait, there often is probable gallstones.  We will await the iron studies.  It would not surprise me if her iron studies were on the low side.  We spent about 40 minutes with Ms. Powley.  We answered all of her questions.  We went over the lab work.  We reassured her that everything will be okay and that she does not have anything serious.  We do not need to do a bone marrow test on her.  We will plan to have her come back to see [place] in about 6 weeks.  Again we will have to see what her iron studies show.  Lattie Haw,  MD

## 2019-01-01 LAB — HEMOGLOBINOPATHY EVALUATION
HGB F QUANT: 0 % (ref 0.0–2.0)
Hgb A2 Quant: 3.1 % (ref 1.8–3.2)
Hgb A: 63.1 % — ABNORMAL LOW (ref 96.4–98.8)
Hgb C: 0 %
Hgb S Quant: 33.8 % — ABNORMAL HIGH
Hgb Variant: 0 %

## 2019-01-01 LAB — ERYTHROPOIETIN: Erythropoietin: 43.1 m[IU]/mL — ABNORMAL HIGH (ref 2.6–18.5)

## 2019-01-07 ENCOUNTER — Ambulatory Visit (INDEPENDENT_AMBULATORY_CARE_PROVIDER_SITE_OTHER): Payer: No Typology Code available for payment source | Admitting: Internal Medicine

## 2019-01-07 ENCOUNTER — Encounter: Payer: Self-pay | Admitting: Internal Medicine

## 2019-01-07 VITALS — BP 126/88 | HR 74 | Temp 98.1°F | Ht 60.0 in | Wt 226.0 lb

## 2019-01-07 DIAGNOSIS — Z6841 Body Mass Index (BMI) 40.0 and over, adult: Secondary | ICD-10-CM

## 2019-01-07 DIAGNOSIS — L309 Dermatitis, unspecified: Secondary | ICD-10-CM

## 2019-01-07 DIAGNOSIS — Z79899 Other long term (current) drug therapy: Secondary | ICD-10-CM | POA: Diagnosis not present

## 2019-01-07 DIAGNOSIS — I10 Essential (primary) hypertension: Secondary | ICD-10-CM

## 2019-01-07 LAB — POCT URINE PREGNANCY: Preg Test, Ur: NEGATIVE

## 2019-01-07 MED ORDER — PHENTERMINE-TOPIRAMATE ER 11.25-69 MG PO CP24: 11.2500 mg | ORAL_CAPSULE | Freq: Every day | ORAL | 1 refills | Status: DC

## 2019-01-07 MED ORDER — TRIAMCINOLONE ACETONIDE 0.1 % EX CREA: 1.0000 "application " | TOPICAL_CREAM | Freq: Two times a day (BID) | CUTANEOUS | 0 refills | Status: DC

## 2019-01-07 MED FILL — TRIAMCINOLONE 0.1% CREAM: 0.1 | 10 days supply | Qty: 60 | Fill #0

## 2019-01-07 NOTE — Patient Instructions (Signed)

## 2019-01-07 NOTE — Progress Notes (Signed)
Subjective:     Patient ID: Angel Byrd , female    DOB: 1996/05/31 , 23 y.o.   MRN: [id number]   Chief Complaint  Patient presents with  . Hypertension    HPI  Hypertension  This is a chronic problem. The current episode started more than 1 year ago. The problem has been gradually improving since onset. The problem is controlled. Pertinent negatives include no blurred vision, chest pain, palpitations or shortness of breath. Risk factors for coronary artery disease include obesity and sedentary lifestyle. Past treatments include beta blockers.     Past Medical History:  Diagnosis Date  . HTN (hypertension)   . Iron deficiency anemia      Family History  Problem Relation Age of Onset  . Sickle cell anemia Mother   . Heart disease Father   . Hypertension Sister      Current Outpatient Medications:  .  Fe Fum-Fe Poly-Vit C-Lactobac (FUSION PO), Take by mouth., Disp: , Rfl:  .  nebivolol (BYSTOLIC) 5 MG tablet, Take 5 mg by mouth daily., Disp: , Rfl:  .  folic acid (FOLVITE) 1 MG tablet, Take 1 tablet (1 mg total) by mouth daily. (Patient not taking: Reported on 01/07/2019), Disp: 30 tablet, Rfl: 11 .  Phentermine-Topiramate (QSYMIA) 11.25-69 MG CP24, Take 11.25 mg by mouth daily., Disp: 30 capsule, Rfl: 1 .  triamcinolone cream (KENALOG) 0.1 %, Apply 1 application topically 2 (two) times daily. Use as needed, Disp: 60 g, Rfl: 0   Allergies  Allergen Reactions  . Other      Review of Systems  Constitutional: Negative.   Eyes: Negative for blurred vision.  Respiratory: Negative.  Negative for shortness of breath.   Cardiovascular: Negative.  Negative for chest pain and palpitations.  Gastrointestinal: Negative.   Skin: Positive for rash (c/o rash on neck, itches on occasion. no blister like lesions).  Neurological: Negative.   Psychiatric/Behavioral: Negative.      Today's Vitals   01/07/19 1057  BP: 126/88  Pulse: 74  Temp: 98.1 F (36.7 C)  TempSrc: Oral   Weight: 226 lb (102.5 kg)  Height: 5' (1.524 m)   Body mass index is 44.14 kg/m.   Objective:  Physical Exam Vitals signs and nursing note reviewed.  Constitutional:      Appearance: Normal appearance.  HENT:     Head: Normocephalic and atraumatic.  Cardiovascular:     Rate and Rhythm: Normal rate and regular rhythm.     Heart sounds: Normal heart sounds.  Pulmonary:     Effort: Pulmonary effort is normal.     Breath sounds: Normal breath sounds.  Skin:    General: Skin is warm.     Coloration: Skin is not pale ().     Findings: Rash present.     Comments: Hyperpigmented, scaly rash on left lateral neck, no vesicular lesions noted  Neurological:     General: No focal deficit present.     Mental Status: She is alert.  Psychiatric:        Mood and Affect: Mood normal.        Behavior: Behavior normal.         Assessment And Plan:     1. Essential hypertension, benign  Fair control. She will continue with current meds. She is encouraged to avoid adding salt to her foods.   2. Dermatitis  She was given rx triamcinolone cream to affected area twice daily as needed.   3. Class 3 severe obesity  due to excess calories with serious comorbidity and body mass index (BMI) of 45.0 to 49.9 in adult [facility])  She was given refill of Qsymia. She will rto in 8 weeks for re-evaluation.  Importance of achieving optimal weight to decrease risk of cardiovascular disease and cancers was discussed with the patient in full detail. She is encouraged to start slowly - start with 10 minutes twice daily at least three to four days per week and to gradually build to 30 minutes five days weekly. She was given tips to incorporate more activity into her daily routine - take stairs when possible, park farther away from her job, grocery stores, etc. .     4. Encounter for long-term (current) use of high-risk medication  - POCT urine pregnancy        [name], MD

## 2019-01-09 LAB — ALPHA-THALASSEMIA GENOTYPR

## 2019-01-23 ENCOUNTER — Telehealth: Payer: Self-pay | Admitting: *Deleted

## 2019-01-23 ENCOUNTER — Telehealth: Payer: Self-pay | Admitting: Hematology & Oncology

## 2019-01-23 NOTE — Telephone Encounter (Signed)
Notified pt of lab results gave instructions to continue taking folic acid and iron supplement.  Discussed upcoming appt. Pt requested to r/s appt for a 930-10 am appt. Message to scheduling. No further concerns.

## 2019-01-23 NOTE — Telephone Encounter (Signed)
Appointments scheduled letter/calendar mailed per 3/17 sch msg

## 2019-01-23 NOTE — Telephone Encounter (Signed)
-----   Message from Eliezer Bottom, NP sent at 01/22/2019  3:08 PM EDT ----- Labs confirmed sickle cell trait continue to take folic acid and iron supplement. Scheduling message sent to [facility] for follow-up in 6 weeks. Thank you!  Sarah  ----- Message ----- From: Buel Ream, [facility] [place] Sent: 12/31/2018   1:13 PM EDT To: Eliezer Bottom, NP

## 2019-01-30 ENCOUNTER — Telehealth: Payer: Self-pay | Admitting: Internal Medicine

## 2019-01-30 NOTE — Telephone Encounter (Signed)
PA STARTED FOR QYSMIA 11.25/69MG  KEY#[id number]

## 2019-02-09 MED FILL — METRONIDAZOLE 500 MG TABS: 500 | 7 days supply | Qty: 14 | Fill #0

## 2019-03-05 ENCOUNTER — Inpatient Hospital Stay: Payer: No Typology Code available for payment source

## 2019-03-05 ENCOUNTER — Telehealth: Payer: Self-pay | Admitting: Family

## 2019-03-05 ENCOUNTER — Telehealth: Payer: Self-pay | Admitting: *Deleted

## 2019-03-05 ENCOUNTER — Inpatient Hospital Stay: Payer: No Typology Code available for payment source | Admitting: Family

## 2019-03-05 NOTE — Telephone Encounter (Signed)
Called pt to confirm today's appt. Pt advised she cant make it today. Message to scheduling to r/s pt appt.

## 2019-03-05 NOTE — Telephone Encounter (Signed)
Called and spoke with patient about r/s her appts for today.  We have r/s per her request/ per 4/28 staff message

## 2019-03-11 ENCOUNTER — Other Ambulatory Visit: Payer: Self-pay | Admitting: Internal Medicine

## 2019-03-11 ENCOUNTER — Encounter: Payer: Self-pay | Admitting: Internal Medicine

## 2019-03-11 ENCOUNTER — Ambulatory Visit (INDEPENDENT_AMBULATORY_CARE_PROVIDER_SITE_OTHER): Payer: No Typology Code available for payment source | Admitting: Internal Medicine

## 2019-03-11 ENCOUNTER — Other Ambulatory Visit: Payer: Self-pay

## 2019-03-11 VITALS — BP 128/96 | HR 80 | Temp 98.1°F | Ht 60.0 in | Wt 234.2 lb

## 2019-03-11 DIAGNOSIS — I10 Essential (primary) hypertension: Secondary | ICD-10-CM | POA: Diagnosis not present

## 2019-03-11 DIAGNOSIS — D509 Iron deficiency anemia, unspecified: Secondary | ICD-10-CM

## 2019-03-11 DIAGNOSIS — Z6841 Body Mass Index (BMI) 40.0 and over, adult: Secondary | ICD-10-CM | POA: Diagnosis not present

## 2019-03-11 MED ORDER — PHENTERMINE HCL 37.5 MG PO CAPS: 37.5000 mg | ORAL_CAPSULE | ORAL | 0 refills | Status: DC

## 2019-03-11 NOTE — Patient Instructions (Signed)

## 2019-03-11 NOTE — Progress Notes (Signed)
Subjective:     Patient ID: Angel Byrd , female    DOB: 26-Feb-1996 , 23 y.o.   MRN: [id number]   Chief Complaint  Patient presents with  . Hypertension    HPI  She is here today for a bp check. She admits that she is not exercising as much as she should.   Hypertension  This is a chronic problem. The current episode started more than 1 year ago. The problem is uncontrolled. Pertinent negatives include no blurred vision, chest pain, orthopnea or shortness of breath. Risk factors for coronary artery disease include obesity and sedentary lifestyle. Past treatments include beta blockers. The current treatment provides moderate improvement. Compliance problems include exercise.      Past Medical History:  Diagnosis Date  . HTN (hypertension)   . Iron deficiency anemia      Family History  Problem Relation Age of Onset  . Sickle cell anemia Mother   . Heart disease Father   . Hypertension Sister      Current Outpatient Medications:  .  Fe Fum-Fe Poly-Vit C-Lactobac (FUSION PO), Take by mouth., Disp: , Rfl:  .  nebivolol (BYSTOLIC) 5 MG tablet, Take 5 mg by mouth daily., Disp: , Rfl:  .  triamcinolone cream (KENALOG) 0.1 %, Apply 1 application topically 2 (two) times daily. Use as needed, Disp: 60 g, Rfl: 0 .  folic acid (FOLVITE) 1 MG tablet, Take 1 tablet (1 mg total) by mouth daily. (Patient not taking: Reported on 01/07/2019), Disp: 30 tablet, Rfl: 11 .  phentermine 37.5 MG capsule, Take 1 capsule (37.5 mg total) by mouth every morning., Disp: 30 capsule, Rfl: 0 .  Phentermine-Topiramate (QSYMIA) 11.25-69 MG CP24, Take 11.25 mg by mouth daily. (Patient not taking: Reported on 03/11/2019), Disp: 30 capsule, Rfl: 1   Allergies  Allergen Reactions  . Other      Review of Systems  Constitutional: Negative.   Eyes: Negative for blurred vision.  Respiratory: Negative.  Negative for shortness of breath.   Cardiovascular: Negative.  Negative for chest pain and orthopnea.   Gastrointestinal: Negative.   Neurological: Negative.   Psychiatric/Behavioral: Negative.      Today's Vitals   03/11/19 0953  BP: (!) 128/96  Pulse: 80  Temp: 98.1 F (36.7 C)  TempSrc: Oral  Weight: 234 lb 3.2 oz (106.2 kg)  Height: 5' (1.524 m)   Body mass index is 45.74 kg/m.   Objective:  Physical Exam Vitals signs and nursing note reviewed.  Constitutional:      Appearance: Normal appearance.  HENT:     Head: Normocephalic and atraumatic.  Cardiovascular:     Rate and Rhythm: Normal rate and regular rhythm.     Heart sounds: Normal heart sounds.  Pulmonary:     Effort: Pulmonary effort is normal.     Breath sounds: Normal breath sounds.  Skin:    General: Skin is warm.  Neurological:     General: No focal deficit present.     Mental Status: She is alert.  Psychiatric:        Mood and Affect: Mood normal.        Behavior: Behavior normal.         Assessment And Plan:     1. Essential hypertension, benign  Fair control. She will continue with current meds. Importance of medication, dietary and exercise compliance was discussed with the patient.   - BMP8+EGFR  2. Iron deficiency anemia, unspecified iron deficiency anemia type  She is  also now followed by Hematology. I will check her iron levels today. She is scheduled for appt w/ hematology later this week, but thinks she will have to reschefule due to work. Patient advised that she should notify them that she had iron studies performed today.   - CBC with Diff - Iron and IBC ([id number]) - Ferritin  3. Class 3 severe obesity due to excess calories without serious comorbidity with body mass index (BMI) of 45.0 to 49.9 in adult [facility])  Pt advised of 8 pound weight gain. I will send in rx phentermine 37.21m - 1/2 tab daily x 6 days, then one tab daily. She will rto in 8 weeks for re-evaluation. At that time, I plan to switch her back to Qsymia. She is in agreement with her treatment plan.   RMaximino Greenland MD    THE PATIENT IS ENCOURAGED TO PRACTICE SOCIAL DISTANCING DUE TO THE COVID-19 PANDEMIC.

## 2019-03-12 ENCOUNTER — Telehealth: Payer: Self-pay

## 2019-03-12 ENCOUNTER — Inpatient Hospital Stay: Payer: No Typology Code available for payment source | Attending: Hematology & Oncology

## 2019-03-12 ENCOUNTER — Other Ambulatory Visit: Payer: No Typology Code available for payment source

## 2019-03-12 ENCOUNTER — Inpatient Hospital Stay: Payer: No Typology Code available for payment source | Admitting: Family

## 2019-03-12 ENCOUNTER — Ambulatory Visit: Payer: No Typology Code available for payment source | Admitting: Family

## 2019-03-12 ENCOUNTER — Other Ambulatory Visit: Payer: Self-pay

## 2019-03-12 LAB — BMP8+EGFR
BUN/Creatinine Ratio: 15 (ref 9–23)
BUN: 11 mg/dL (ref 6–20)
CO2: 20 mmol/L (ref 20–29)
Calcium: 9.4 mg/dL (ref 8.7–10.2)
Chloride: 104 mmol/L (ref 96–106)
Creatinine, Ser: 0.75 mg/dL (ref 0.57–1.00)
GFR calc Af Amer: 128 mL/min/{1.73_m2} (ref >59)
GFR calc non Af Amer: 111 mL/min/{1.73_m2} (ref >59)
Glucose: 78 mg/dL (ref 65–99)
Potassium: 4.3 mmol/L (ref 3.5–5.2)
Sodium: 138 mmol/L (ref 134–144)

## 2019-03-12 LAB — CBC WITH DIFFERENTIAL/PLATELET
Basophils Absolute: 0 10*3/uL (ref 0.0–0.2)
Basos: 0 %
EOS (ABSOLUTE): 0.1 10*3/uL (ref 0.0–0.4)
Eos: 2 %
Hematocrit: 28.9 % — ABNORMAL LOW (ref 34.0–46.6)
Hemoglobin: 8.9 g/dL — ABNORMAL LOW (ref 11.1–15.9)
Immature Grans (Abs): 0 10*3/uL (ref 0.0–0.1)
Immature Granulocytes: 0 %
Lymphocytes Absolute: 2 10*3/uL (ref 0.7–3.1)
Lymphs: 28 %
MCH: 22 pg — ABNORMAL LOW (ref 26.6–33.0)
MCHC: 30.8 g/dL — ABNORMAL LOW (ref 31.5–35.7)
MCV: 72 fL — ABNORMAL LOW (ref 79–97)
Monocytes Absolute: 0.4 10*3/uL (ref 0.1–0.9)
Monocytes: 5 %
Neutrophils Absolute: 4.5 10*3/uL (ref 1.4–7.0)
Neutrophils: 65 %
Platelets: 545 10*3/uL — ABNORMAL HIGH (ref 150–450)
RBC: 4.04 x10E6/uL (ref 3.77–5.28)
RDW: 16.6 % — ABNORMAL HIGH (ref 11.7–15.4)
WBC: 7.1 10*3/uL (ref 3.4–10.8)

## 2019-03-12 LAB — IRON AND TIBC
Iron Saturation: 8 % — CL (ref 15–55)
Iron: 26 ug/dL — ABNORMAL LOW (ref 27–159)
Total Iron Binding Capacity: 312 ug/dL (ref 250–450)
UIBC: 286 ug/dL (ref 131–425)

## 2019-03-12 LAB — FERRITIN: Ferritin: 7 ng/mL — ABNORMAL LOW (ref 15–150)

## 2019-03-12 MED ORDER — PHENTERMINE HCL 37.5 MG PO CAPS: 37.5000 mg | ORAL_CAPSULE | ORAL | 0 refills | Status: DC

## 2019-03-14 ENCOUNTER — Other Ambulatory Visit: Payer: Self-pay | Admitting: Internal Medicine

## 2019-03-14 ENCOUNTER — Other Ambulatory Visit: Payer: Self-pay

## 2019-03-14 MED ORDER — PHENTERMINE HCL 37.5 MG PO CAPS: 37.5000 mg | ORAL_CAPSULE | ORAL | 0 refills | Status: DC

## 2019-03-14 MED FILL — PHENTERMINE 37.5 MG TABLET: 37.5 | 30 days supply | Qty: 30 | Fill #0

## 2019-03-15 ENCOUNTER — Telehealth: Payer: Self-pay

## 2019-03-15 NOTE — Telephone Encounter (Signed)
Left the patient a message to call back for lab results. 

## 2019-03-15 NOTE — Telephone Encounter (Signed)
-----   Message from Glendale Chard, MD sent at 03/14/2019  6:42 PM EDT ----- Is she going to sign up for the portal? Your kidney fxn is nl. Your blood count is low. Are you taking the iron pills? Are you fatigued? Did you have iron infusion with hematology? We cannot continue to avoid this? Phentermine has been sent to [facility]/

## 2019-05-07 ENCOUNTER — Ambulatory Visit (INDEPENDENT_AMBULATORY_CARE_PROVIDER_SITE_OTHER): Payer: No Typology Code available for payment source | Admitting: Internal Medicine

## 2019-05-07 ENCOUNTER — Other Ambulatory Visit: Payer: Self-pay

## 2019-05-07 ENCOUNTER — Encounter: Payer: Self-pay | Admitting: Internal Medicine

## 2019-05-07 VITALS — BP 146/98 | HR 80 | Temp 98.5°F | Ht 60.0 in | Wt 229.6 lb

## 2019-05-07 DIAGNOSIS — Z3201 Encounter for pregnancy test, result positive: Secondary | ICD-10-CM

## 2019-05-07 DIAGNOSIS — I10 Essential (primary) hypertension: Secondary | ICD-10-CM | POA: Diagnosis not present

## 2019-05-07 MED ORDER — TRIAMCINOLONE ACETONIDE 0.1 % EX CREA: 1.0000 "application " | TOPICAL_CREAM | Freq: Two times a day (BID) | CUTANEOUS | 0 refills | Status: DC

## 2019-05-07 MED ORDER — NIFEDIPINE ER OSMOTIC RELEASE 60 MG PO TB24: 60.0000 mg | ORAL_TABLET | Freq: Every day | ORAL | 1 refills | Status: DC

## 2019-05-07 MED FILL — TRIAMCINOLONE 0.1% CREAM: 0.1 | 30 days supply | Qty: 60 | Fill #0

## 2019-05-07 MED FILL — NIFEDIPINE ER OSMOTIC RELEA: 60 | 30 days supply | Qty: 30 | Fill #0

## 2019-05-09 LAB — OBSTETRIC PANEL, INCLUDING HIV
Antibody Screen: NEGATIVE
Basophils Absolute: 0 10*3/uL (ref 0.0–0.2)
Basos: 0 %
EOS (ABSOLUTE): 0.1 10*3/uL (ref 0.0–0.4)
Eos: 1 %
HIV Screen 4th Generation wRfx: NONREACTIVE
Hematocrit: 30.4 % — ABNORMAL LOW (ref 34.0–46.6)
Hemoglobin: 8.9 g/dL — ABNORMAL LOW (ref 11.1–15.9)
Hepatitis B Surface Ag: NEGATIVE
Immature Grans (Abs): 0 10*3/uL (ref 0.0–0.1)
Immature Granulocytes: 0 %
Lymphocytes Absolute: 2.2 10*3/uL (ref 0.7–3.1)
Lymphs: 24 %
MCH: 20.8 pg — ABNORMAL LOW (ref 26.6–33.0)
MCHC: 29.3 g/dL — ABNORMAL LOW (ref 31.5–35.7)
MCV: 71 fL — ABNORMAL LOW (ref 79–97)
Monocytes Absolute: 0.6 10*3/uL (ref 0.1–0.9)
Monocytes: 7 %
Neutrophils Absolute: 6.1 10*3/uL (ref 1.4–7.0)
Neutrophils: 68 %
Platelets: 476 10*3/uL — ABNORMAL HIGH (ref 150–450)
RBC: 4.28 x10E6/uL (ref 3.77–5.28)
RDW: 17 % — ABNORMAL HIGH (ref 11.7–15.4)
RPR Ser Ql: NONREACTIVE
Rh Factor: POSITIVE
Rubella Antibodies, IGG: 2.23 index (ref >0.99)
WBC: 9 10*3/uL (ref 3.4–10.8)

## 2019-05-09 LAB — BETA HCG QUANT (REF LAB): hCG Quant: 60 m[IU]/mL

## 2019-05-13 ENCOUNTER — Ambulatory Visit: Payer: No Typology Code available for payment source | Admitting: Internal Medicine

## 2019-05-14 ENCOUNTER — Other Ambulatory Visit: Payer: Self-pay

## 2019-05-14 ENCOUNTER — Inpatient Hospital Stay (HOSPITAL_COMMUNITY): Payer: No Typology Code available for payment source

## 2019-05-14 ENCOUNTER — Encounter (HOSPITAL_COMMUNITY): Payer: Self-pay | Admitting: *Deleted

## 2019-05-14 ENCOUNTER — Inpatient Hospital Stay (HOSPITAL_COMMUNITY)
Admission: AD | Admit: 2019-05-14 | Discharge: 2019-05-14 | Disposition: A | Discharge status: Home / Self Care | Payer: No Typology Code available for payment source | Attending: Obstetrics and Gynecology | Admitting: Obstetrics and Gynecology

## 2019-05-14 DIAGNOSIS — I10 Essential (primary) hypertension: Secondary | ICD-10-CM

## 2019-05-14 DIAGNOSIS — O99611 Diseases of the digestive system complicating pregnancy, first trimester: Secondary | ICD-10-CM | POA: Diagnosis not present

## 2019-05-14 DIAGNOSIS — B373 Candidiasis of vulva and vagina: Secondary | ICD-10-CM | POA: Diagnosis not present

## 2019-05-14 DIAGNOSIS — O98811 Other maternal infectious and parasitic diseases complicating pregnancy, first trimester: Secondary | ICD-10-CM | POA: Insufficient documentation

## 2019-05-14 DIAGNOSIS — O26891 Other specified pregnancy related conditions, first trimester: Secondary | ICD-10-CM | POA: Insufficient documentation

## 2019-05-14 DIAGNOSIS — R109 Unspecified abdominal pain: Secondary | ICD-10-CM | POA: Diagnosis not present

## 2019-05-14 DIAGNOSIS — Z3A01 Less than 8 weeks gestation of pregnancy: Secondary | ICD-10-CM

## 2019-05-14 DIAGNOSIS — O3680X0 Pregnancy with inconclusive fetal viability, not applicable or unspecified: Secondary | ICD-10-CM | POA: Diagnosis not present

## 2019-05-14 DIAGNOSIS — B3731 Acute candidiasis of vulva and vagina: Secondary | ICD-10-CM

## 2019-05-14 DIAGNOSIS — K219 Gastro-esophageal reflux disease without esophagitis: Secondary | ICD-10-CM | POA: Insufficient documentation

## 2019-05-14 LAB — HCG, QUANTITATIVE, PREGNANCY: hCG, Beta Chain, Quant, S: 1214 m[IU]/mL — ABNORMAL HIGH (ref <5)

## 2019-05-14 LAB — COMPREHENSIVE METABOLIC PANEL
ALT: 15 U/L (ref 0–44)
AST: 18 U/L (ref 15–41)
Albumin: 4 g/dL (ref 3.5–5.0)
Alkaline Phosphatase: 64 U/L (ref 38–126)
Anion gap: 9 (ref 5–15)
BUN: <5 mg/dL — ABNORMAL LOW (ref 6–20)
CO2: 22 mmol/L (ref 22–32)
Calcium: 9.2 mg/dL (ref 8.9–10.3)
Chloride: 106 mmol/L (ref 98–111)
Creatinine, Ser: 0.69 mg/dL (ref 0.44–1.00)
GFR calc Af Amer: >60 mL/min (ref >60)
GFR calc non Af Amer: >60 mL/min (ref >60)
Glucose, Bld: 86 mg/dL (ref 70–99)
Potassium: 3 mmol/L — ABNORMAL LOW (ref 3.5–5.1)
Sodium: 137 mmol/L (ref 135–145)
Total Bilirubin: 0.8 mg/dL (ref 0.3–1.2)
Total Protein: 7.9 g/dL (ref 6.5–8.1)

## 2019-05-14 LAB — WET PREP, GENITAL
Clue Cells Wet Prep HPF POC: NONE SEEN
Sperm: NONE SEEN
Trich, Wet Prep: NONE SEEN

## 2019-05-14 LAB — URINALYSIS, ROUTINE W REFLEX MICROSCOPIC
Bilirubin Urine: NEGATIVE
Glucose, UA: NEGATIVE mg/dL
Hgb urine dipstick: NEGATIVE
Ketones, ur: NEGATIVE mg/dL
Leukocytes,Ua: NEGATIVE
Nitrite: NEGATIVE
Protein, ur: 30 mg/dL — AB
Specific Gravity, Urine: 1.02 (ref 1.005–1.030)
pH: 6 (ref 5.0–8.0)

## 2019-05-14 LAB — CBC
HCT: 30.7 % — ABNORMAL LOW (ref 36.0–46.0)
Hemoglobin: 9.2 g/dL — ABNORMAL LOW (ref 12.0–15.0)
MCH: 20.9 pg — ABNORMAL LOW (ref 26.0–34.0)
MCHC: 30 g/dL (ref 30.0–36.0)
MCV: 69.8 fL — ABNORMAL LOW (ref 80.0–100.0)
Platelets: 626 10*3/uL — ABNORMAL HIGH (ref 150–400)
RBC: 4.4 MIL/uL (ref 3.87–5.11)
RDW: 17.8 % — ABNORMAL HIGH (ref 11.5–15.5)
WBC: 8.9 10*3/uL (ref 4.0–10.5)
nRBC: 0 % (ref 0.0–0.2)

## 2019-05-14 LAB — LIPASE, BLOOD: Lipase: 33 U/L (ref 11–51)

## 2019-05-14 LAB — AMYLASE: Amylase: 67 U/L (ref 28–100)

## 2019-05-14 MED ORDER — TERCONAZOLE 0.4 % VA CREA: 1.0000 | TOPICAL_CREAM | Freq: Every day | VAGINAL | 1 refills | Status: DC

## 2019-05-14 MED ORDER — PANTOPRAZOLE SODIUM 20 MG PO TBEC: 40.0000 mg | DELAYED_RELEASE_TABLET | Freq: Every day | ORAL | 2 refills | Status: DC

## 2019-05-14 NOTE — MAU Provider Note (Signed)
Chief Complaint: Abdominal Pain and Nausea   First Provider Initiated Contact with Patient 05/14/19 1601     SUBJECTIVE HPI: Angel Byrd is a 23 y.o. G1P0 at [redacted]w[redacted]d who presents to Maternity Admissions reporting: low and upper and abd pain since yesterday  Vaginal Bleeding: Denies Passage of tissue or clots: Denies Dizziness: Denies  A positive  Pain Location: Suprapubic and epigastric Quality: Cramping and sharp Severity: 5/10 on pain scale now.  8/10 at worst. Duration: 24 hours Course: Unchanged Context: Early pregnancy.  IUP not confirmed. Timing: Intermittent Modifying factors: No improvement with going to the bathroom. Associated signs and symptoms: Negative for fever, chills, urinary complaints, vaginal bleeding, vaginal discharge.  Positive for decreased appetite, occasional nausea and vomiting.  Past Medical History:  Diagnosis Date  . HTN (hypertension)   . Iron deficiency anemia    OB History  Gravida Para Term Preterm AB Living  1            SAB TAB Ectopic Multiple Live Births               # Outcome Date GA Lbr Len/2nd Weight Sex Delivery Anes PTL Lv  1 Current            Past Surgical History:  Procedure Laterality Date  . NO PAST SURGERIES     Social History   Socioeconomic History  . Marital status: Single    Spouse name: Not on file  . Number of children: Not on file  . Years of education: Not on file  . Highest education level: Not on file  Occupational History  . Not on file  Social Needs  . Financial resource strain: Not on file  . Food insecurity    Worry: Not on file    Inability: Not on file  . Transportation needs    Medical: Not on file    Non-medical: Not on file  Tobacco Use  . Smoking status: Never Smoker  . Smokeless tobacco: Never Used  Substance and Sexual Activity  . Alcohol use: No  . Drug use: No  . Sexual activity: Yes    Birth control/protection: None  Lifestyle  . Physical activity    Days per week: Not on  file    Minutes per session: Not on file  . Stress: Not on file  Relationships  . Social Herbalist on phone: Not on file    Gets together: Not on file    Attends religious service: Not on file    Active member of club or organization: Not on file    Attends meetings of clubs or organizations: Not on file    Relationship status: Not on file  . Intimate partner violence    Fear of current or ex partner: Not on file    Emotionally abused: Not on file    Physically abused: Not on file    Forced sexual activity: Not on file  Other Topics Concern  . Not on file  Social History Narrative  . Not on file   No current facility-administered medications on file prior to encounter.    Current Outpatient Medications on File Prior to Encounter  Medication Sig Dispense Refill  . Fe Fum-Fe Poly-Vit C-Lactobac (FUSION PO) Take by mouth.    . folic acid (FOLVITE) 1 MG tablet Take 1 tablet (1 mg total) by mouth daily. 30 tablet 11  . NIFEdipine (PROCARDIA XL) 60 MG 24 hr tablet Take 1 tablet (60 mg total)  by mouth daily. 30 tablet 1  . triamcinolone cream (KENALOG) 0.1 % Apply 1 application topically 2 (two) times daily. Use as needed 60 g 0  . nebivolol (BYSTOLIC) 5 MG tablet Take 5 mg by mouth daily.    . phentermine 37.5 MG capsule Take 1 capsule (37.5 mg total) by mouth every morning. (Patient not taking: Reported on 05/07/2019) 30 capsule 0  . Phentermine-Topiramate (QSYMIA) 11.25-69 MG CP24 Take 11.25 mg by mouth daily. (Patient not taking: Reported on 03/11/2019) 30 capsule 1   Allergies  Allergen Reactions  . Other     I have reviewed the past Medical Hx, Surgical Hx, Social Hx, Allergies and Medications.   Review of Systems  Constitutional: Positive for appetite change. Negative for chills and fever.  Gastrointestinal: Positive for abdominal pain, nausea and vomiting. Negative for abdominal distention, blood in stool, constipation and diarrhea.  Genitourinary: Negative for  difficulty urinating, dysuria, flank pain, frequency, hematuria, pelvic pain, vaginal bleeding and vaginal discharge.  Musculoskeletal: Negative for back pain.    OBJECTIVE Patient Vitals for the past 24 hrs:  BP Temp Temp src Pulse Resp SpO2 Height Weight  05/14/19 1540 133/78 - - (!) 108 - - - -  05/14/19 1455 (!) 158/94 99.2 F (37.3 C) Oral (!) 119 18 100 % 5\' 1"  (1.549 m) 104.6 kg   Constitutional: Well-developed, well-nourished female in no acute distress.  Cardiovascular: normal rate Respiratory: normal rate and effort.  GI: Abd soft, mild suprapubic tenderness.  No upper abdominal tenderness. Pos BS x 4 MS: Extremities nontender, no edema, normal ROM Neurologic: Alert and oriented x 4.  GU: Neg CVAT.  PELVIC EXAM: NEFG, small amount of curd-like, odorless discharge, no blood noted, cervix clean. cervix closed; uterus not obviously enlarged but exam limited by body habitus, no adnexal tenderness or masses.  No CMT.  LAB RESULTS Results for orders placed or performed during the hospital encounter of 05/14/19 (from the past 24 hour(s))  Urinalysis, Routine w reflex microscopic     Status: Abnormal   Collection Time: 05/14/19  4:15 PM  Result Value Ref Range   Color, Urine AMBER (A) YELLOW   APPearance HAZY (A) CLEAR   Specific Gravity, Urine 1.020 1.005 - 1.030   pH 6.0 5.0 - 8.0   Glucose, UA NEGATIVE NEGATIVE mg/dL   Hgb urine dipstick NEGATIVE NEGATIVE   Bilirubin Urine NEGATIVE NEGATIVE   Ketones, ur NEGATIVE NEGATIVE mg/dL   Protein, ur 30 (A) NEGATIVE mg/dL   Nitrite NEGATIVE NEGATIVE   Leukocytes,Ua NEGATIVE NEGATIVE   RBC / HPF 0-5 0 - 5 RBC/hpf   WBC, UA 0-5 0 - 5 WBC/hpf   Bacteria, UA RARE (A) NONE SEEN   Squamous Epithelial / LPF 0-5 0 - 5   Mucus PRESENT   Wet prep, genital     Status: Abnormal   Collection Time: 05/14/19  4:33 PM  Result Value Ref Range   Yeast Wet Prep HPF POC PRESENT (A) NONE SEEN   Trich, Wet Prep NONE SEEN NONE SEEN   Clue Cells  Wet Prep HPF POC NONE SEEN NONE SEEN   WBC, Wet Prep HPF POC MANY (A) NONE SEEN   Sperm NONE SEEN   hCG, quantitative, pregnancy     Status: Abnormal   Collection Time: 05/14/19  4:40 PM  Result Value Ref Range   hCG, Beta Chain, Quant, S 1,214 (H) <5 mIU/mL  CBC     Status: Abnormal   Collection Time: 05/14/19  4:40  PM  Result Value Ref Range   WBC 8.9 4.0 - 10.5 K/uL   RBC 4.40 3.87 - 5.11 MIL/uL   Hemoglobin 9.2 (L) 12.0 - 15.0 g/dL   HCT 30.7 (L) 36.0 - 46.0 %   MCV 69.8 (L) 80.0 - 100.0 fL   MCH 20.9 (L) 26.0 - 34.0 pg   MCHC 30.0 30.0 - 36.0 g/dL   RDW 17.8 (H) 11.5 - 15.5 %   Platelets 626 (H) 150 - 400 K/uL   nRBC 0.0 0.0 - 0.2 %  Amylase     Status: None   Collection Time: 05/14/19  4:40 PM  Result Value Ref Range   Amylase 67 28 - 100 U/L  Comprehensive metabolic panel     Status: Abnormal   Collection Time: 05/14/19  4:40 PM  Result Value Ref Range   Sodium 137 135 - 145 mmol/L   Potassium 3.0 (L) 3.5 - 5.1 mmol/L   Chloride 106 98 - 111 mmol/L   CO2 22 22 - 32 mmol/L   Glucose, Bld 86 70 - 99 mg/dL   BUN <5 (L) 6 - 20 mg/dL   Creatinine, Ser 0.69 0.44 - 1.00 mg/dL   Calcium 9.2 8.9 - 10.3 mg/dL   Total Protein 7.9 6.5 - 8.1 g/dL   Albumin 4.0 3.5 - 5.0 g/dL   AST 18 15 - 41 U/L   ALT 15 0 - 44 U/L   Alkaline Phosphatase 64 38 - 126 U/L   Total Bilirubin 0.8 0.3 - 1.2 mg/dL   GFR calc non Af Amer >60 >60 mL/min   GFR calc Af Amer >60 >60 mL/min   Anion gap 9 5 - 15  Lipase, blood     Status: None   Collection Time: 05/14/19  4:40 PM  Result Value Ref Range   Lipase 33 11 - 51 U/L    IMAGING US Ob Comp Less 14 Wks  Result Date: 05/14/2019 CLINICAL DATA:  Pregnant patient with abdominal cramping EXAM: OBSTETRIC <14 WK [place] AND TRANSVAGINAL OB US TECHNIQUE: Both transabdominal and transvaginal ultrasound examinations were performed for complete evaluation of the gestation as well as the maternal uterus, adnexal regions, and pelvic cul-de-sac. Transvaginal  technique was performed to assess early pregnancy. COMPARISON:  None. FINDINGS: Intrauterine gestational sac: Single Yolk sac:  Not Visualized. Embryo:  Not Visualized. Cardiac Activity: Not Visualized. MSD: 2.9 mm   4 w   6 d Subchorionic hemorrhage:  None visualized. Maternal uterus/adnexae: Normal right and left ovaries. Left ovarian corpus luteum. No free fluid in the pelvis. IMPRESSION: Probable early intrauterine gestational sac, but no yolk sac, fetal pole, or cardiac activity yet visualized. Recommend follow-up quantitative B-HCG levels and follow-up US in 14 days to assess viability. This recommendation follows SRU consensus guidelines: Diagnostic Criteria for Nonviable Pregnancy Early in the First Trimester. [place] Med 2013; 161:0960-45. Electronically Signed   By: Lovey Newcomer M.D.   On: 05/14/2019 17:21   US Ob Transvaginal  Result Date: 05/14/2019 CLINICAL DATA:  Pregnant patient with abdominal cramping EXAM: OBSTETRIC <14 WK [place] AND TRANSVAGINAL OB US TECHNIQUE: Both transabdominal and transvaginal ultrasound examinations were performed for complete evaluation of the gestation as well as the maternal uterus, adnexal regions, and pelvic cul-de-sac. Transvaginal technique was performed to assess early pregnancy. COMPARISON:  None. FINDINGS: Intrauterine gestational sac: Single Yolk sac:  Not Visualized. Embryo:  Not Visualized. Cardiac Activity: Not Visualized. MSD: 2.9 mm   4 w   6 d Subchorionic  hemorrhage:  None visualized. Maternal uterus/adnexae: Normal right and left ovaries. Left ovarian corpus luteum. No free fluid in the pelvis. IMPRESSION: Probable early intrauterine gestational sac, but no yolk sac, fetal pole, or cardiac activity yet visualized. Recommend follow-up quantitative B-HCG levels and follow-up US in 14 days to assess viability. This recommendation follows SRU consensus guidelines: Diagnostic Criteria for Nonviable Pregnancy Early in the First Trimester. [employer] Med 2013;  166:0630-16. Electronically Signed   By: Lovey Newcomer M.D.   On: 05/14/2019 17:21    MAU COURSE CBC, Quant, ABO/Rh, ultrasound, wet prep and GC/chlamydia culture, UA  MDM - Pain in early pregnancy with pregnancy of unknown anatomic location, but hemodynamically stable.  -CBC will treat with Terazol  -Epigastric pain possibly due to reflux.  Will treat with Protonix.  ASSESSMENT 1. Abdominal pain during pregnancy in first trimester   2. Vaginal yeast infection   3. Gastroesophageal reflux during pregnancy in first trimester, antepartum   4. Pregnancy of unknown anatomic location     PLAN Discharge home in stable condition. Ectopic precautions Follow-up Information    [facility] Follow up on 05/16/2019.   Specialty: Obstetrics and Gynecology Why: for repeat labwork. Call the office to verify lab appointment.  Contact information: [address]. [address] [place] [phone]       [facility] Follow up.   Specialty: Obstetrics and Gynecology Why: as needed in pregnancy emergencies Contact information: [address] [phone] [place] [phone]         Allergies as of 05/14/2019      Reactions   Other       Medication List    STOP taking these medications   nebivolol 5 MG tablet Commonly known as: BYSTOLIC   phentermine 76.1 MG capsule   Phentermine-Topiramate 11.25-69 MG Cp24 Commonly known as: Qsymia     TAKE these medications   folic acid 1 MG tablet Commonly known as: FOLVITE Take 1 tablet (1 mg total) by mouth daily.   FUSION PO Take by mouth.   NIFEdipine 60 MG 24 hr tablet Commonly known as: Procardia XL Take 1 tablet (60 mg total) by mouth daily.   pantoprazole 20 MG tablet Commonly known as: PROTONIX Take 2 tablets (40 mg total) by mouth daily.   terconazole 0.4 % vaginal cream Commonly known as: Terazol 7 Place  1 applicator vaginally at bedtime.   triamcinolone cream 0.1 % Commonly known as: KENALOG Apply 1 application topically 2 (two) times daily. Use as needed        Tamala Julian [place], [place] 05/14/2019  6:25 PM  4

## 2019-05-14 NOTE — MAU Note (Signed)
Is like [redacted] wks pregnant.  Has had some cramping, but it woke her this morning (cramping in upper abd), feels different.  Stomach is uneased.  Didn't feel like this until after she ate yesterday.  So she is just coming for assurance that everything is ok.  She also threw up this morning.

## 2019-05-14 NOTE — Discharge Instructions (Signed)

## 2019-05-15 LAB — GC/CHLAMYDIA PROBE AMP (~~LOC~~) NOT AT ARMC
Chlamydia: NEGATIVE
Neisseria Gonorrhea: NEGATIVE

## 2019-05-15 LAB — HIV ANTIBODY (ROUTINE TESTING W REFLEX): HIV Screen 4th Generation wRfx: NONREACTIVE

## 2019-05-15 MED FILL — PANTOPRAZOLE SOD DR 20 MG T: 20 | 30 days supply | Qty: 60 | Fill #0

## 2019-05-15 MED FILL — TERCONAZOLE 0.4% VAG CREAM: 0.4 | 7 days supply | Qty: 45 | Fill #0

## 2019-05-19 NOTE — Progress Notes (Signed)
Subjective:     Patient ID: Angel Byrd , female    DOB: [birth year] , 23 y.o.   MRN: [id number]   Chief Complaint  Patient presents with  . Hypertension    HPI  She is here today b/c she recently had positive pregnancy test.  She is not sure if she can stay on current blood pressure medication.   Hypertension This is a chronic problem. The current episode started more than 1 year ago. The problem has been gradually improving since onset. Pertinent negatives include no blurred vision, chest pain, palpitations or shortness of breath.     Past Medical History:  Diagnosis Date  . HTN (hypertension)   . Iron deficiency anemia      Family History  Problem Relation Age of Onset  . Sickle cell anemia Mother   . Heart disease Father   . Hypertension Sister      Current Outpatient Medications:  .  Fe Fum-Fe Poly-Vit C-Lactobac (FUSION PO), Take by mouth., Disp: , Rfl:  .  folic acid (FOLVITE) 1 MG tablet, Take 1 tablet (1 mg total) by mouth daily., Disp: 30 tablet, Rfl: 11 .  triamcinolone cream (KENALOG) 0.1 %, Apply 1 application topically 2 (two) times daily. Use as needed, Disp: 60 g, Rfl: 0 .  NIFEdipine (PROCARDIA XL) 60 MG 24 hr tablet, Take 1 tablet (60 mg total) by mouth daily., Disp: 30 tablet, Rfl: 1 .  pantoprazole (PROTONIX) 20 MG tablet, Take 2 tablets (40 mg total) by mouth daily., Disp: 30 tablet, Rfl: 2 .  terconazole (TERAZOL 7) 0.4 % vaginal cream, Place 1 applicator vaginally at bedtime., Disp: 45 g, Rfl: 1   Allergies  Allergen Reactions  . Other      Review of Systems  Constitutional: Negative.   Eyes: Negative for blurred vision.  Respiratory: Negative.  Negative for shortness of breath.   Cardiovascular: Negative.  Negative for chest pain and palpitations.  Gastrointestinal: Negative.   Neurological: Negative.   Psychiatric/Behavioral: Negative.      Today's Vitals   05/07/19 1154  BP: (!) 146/98  Pulse: 80  Temp: 98.5 F (36.9 C)   TempSrc: Oral  Weight: 229 lb 9.6 oz (104.1 kg)  Height: 5' (1.524 m)   Body mass index is 44.84 kg/m.   Objective:  Physical Exam Vitals signs and nursing note reviewed.  Constitutional:      Appearance: Normal appearance.  HENT:     Head: Normocephalic and atraumatic.  Cardiovascular:     Rate and Rhythm: Normal rate and regular rhythm.     Heart sounds: Normal heart sounds.  Pulmonary:     Effort: Pulmonary effort is normal.     Breath sounds: Normal breath sounds.  Skin:    General: Skin is warm.  Neurological:     General: No focal deficit present.     Mental Status: She is alert.  Psychiatric:        Mood and Affect: Mood normal.        Behavior: Behavior normal.         Assessment And Plan:     1. Essential hypertension, benign  Fair control. Pt advised to stop Bystolic. I will start her on Nifedipine XL 60mg  daily. She is encouraged to restrict her salt intake. She will rto in four weeks for re-evaluation.   2. Positive urine pregnancy test  I will check serum Bhcg today. I will also check OB panel. I will be sure to forward this  to her OB/GYN practice for their review. She was also given samples of prenatal vitamins to take once daily. All questions were answered to her satisfaction.   - Obstetric Panel, Including HIV - Beta HCG, Quant ([facility])        [name], MD    THE PATIENT IS ENCOURAGED TO PRACTICE SOCIAL DISTANCING DUE TO THE COVID-19 PANDEMIC.

## 2019-05-28 ENCOUNTER — Inpatient Hospital Stay (HOSPITAL_COMMUNITY): Payer: No Typology Code available for payment source

## 2019-05-28 ENCOUNTER — Other Ambulatory Visit: Payer: Self-pay

## 2019-05-28 ENCOUNTER — Ambulatory Visit: Payer: No Typology Code available for payment source | Admitting: Internal Medicine

## 2019-05-28 ENCOUNTER — Encounter (HOSPITAL_COMMUNITY): Payer: Self-pay | Admitting: *Deleted

## 2019-05-28 ENCOUNTER — Inpatient Hospital Stay (HOSPITAL_COMMUNITY)
Admission: AD | Admit: 2019-05-28 | Discharge: 2019-05-28 | Disposition: A | Discharge status: Home / Self Care | Payer: No Typology Code available for payment source | Attending: Obstetrics & Gynecology | Admitting: Obstetrics & Gynecology

## 2019-05-28 DIAGNOSIS — O209 Hemorrhage in early pregnancy, unspecified: Secondary | ICD-10-CM | POA: Diagnosis not present

## 2019-05-28 DIAGNOSIS — O9989 Other specified diseases and conditions complicating pregnancy, childbirth and the puerperium: Secondary | ICD-10-CM | POA: Insufficient documentation

## 2019-05-28 DIAGNOSIS — R1011 Right upper quadrant pain: Secondary | ICD-10-CM | POA: Insufficient documentation

## 2019-05-28 DIAGNOSIS — D509 Iron deficiency anemia, unspecified: Secondary | ICD-10-CM | POA: Insufficient documentation

## 2019-05-28 DIAGNOSIS — O99011 Anemia complicating pregnancy, first trimester: Secondary | ICD-10-CM | POA: Diagnosis not present

## 2019-05-28 DIAGNOSIS — Z3A01 Less than 8 weeks gestation of pregnancy: Secondary | ICD-10-CM | POA: Insufficient documentation

## 2019-05-28 DIAGNOSIS — Z3491 Encounter for supervision of normal pregnancy, unspecified, first trimester: Secondary | ICD-10-CM

## 2019-05-28 DIAGNOSIS — Z79899 Other long term (current) drug therapy: Secondary | ICD-10-CM | POA: Diagnosis not present

## 2019-05-28 DIAGNOSIS — O10911 Unspecified pre-existing hypertension complicating pregnancy, first trimester: Secondary | ICD-10-CM | POA: Diagnosis not present

## 2019-05-28 LAB — CBC
HCT: 30.3 % — ABNORMAL LOW (ref 36.0–46.0)
Hemoglobin: 9.3 g/dL — ABNORMAL LOW (ref 12.0–15.0)
MCH: 21.6 pg — ABNORMAL LOW (ref 26.0–34.0)
MCHC: 30.7 g/dL (ref 30.0–36.0)
MCV: 70.5 fL — ABNORMAL LOW (ref 80.0–100.0)
Platelets: 494 10*3/uL — ABNORMAL HIGH (ref 150–400)
RBC: 4.3 MIL/uL (ref 3.87–5.11)
RDW: 19.4 % — ABNORMAL HIGH (ref 11.5–15.5)
WBC: 7.8 10*3/uL (ref 4.0–10.5)
nRBC: 0 % (ref 0.0–0.2)

## 2019-05-28 LAB — HCG, QUANTITATIVE, PREGNANCY: hCG, Beta Chain, Quant, S: 29803 m[IU]/mL — ABNORMAL HIGH (ref <5)

## 2019-05-28 NOTE — MAU Note (Signed)
Started spotting yesterday, is red.  Passing some small clots this morning. Not soaking pad. No real pain.

## 2019-05-28 NOTE — MAU Provider Note (Signed)
History    First Provider Initiated Contact with Patient 05/28/19 1125      Chief Complaint:  Vaginal Bleeding   Angel Byrd is  23 y.o. G1P0 Patient's last menstrual period was 04/09/2019.Marland Kitchen Patient is here for follow up of quantitative HCG and ongoing surveillance of pregnancy status.   She is [redacted]w[redacted]d weeks gestation  by LMP.    Since her last visit, the patient is with new complaint of vaginal bleeding.  Has been going for several days. Followed up at [facility] after MAU visit 05/14/19. Nml rise in quant. Did not repeat US. IUP not verified.   ROS Abdominal Pain: Mild RUQ  Vaginal bleeding: lighter than period.   Passage of clots or tissue: Passing numerous small clots. Denies passage of tissue.  Dizziness: Denies.   A Pos  Her previous Quantitative HCG values are: 6/30: 60 7/7: 1214 7/9: 2174 (different lab)    Physical Exam   Patient Vitals for the past 24 hrs:  BP Temp Pulse Resp SpO2 Weight  05/28/19 1031 126/76 98.6 F (37 C) (!) 111 18 99 % -  05/28/19 1028 - - - - - 105.2 kg   Constitutional: Well-nourished female in no apparent distress. No pallor Neuro: Alert and oriented 4 Cardiovascular: Normal rate Respiratory: Normal effort and rate Abdomen: Soft, nontender Gynecological Exam: normal external genitalia, vulva, vagina, cervix, uterus and adnexa, VULVA: normal appearing vulva with no masses, tenderness or lesions, VAGINA: normal appearing vagina with normal color and discharge, no lesions, CERVIX: normal appearing cervix without discharge or lesions, small amount of blown blood noted, UTERUS: non-palpable, exam limited by body habitus, exam chaperoned by RN  Labs: Results for orders placed or performed during the hospital encounter of 05/28/19 (from the past 24 hour(s))  CBC   Collection Time: 05/28/19 11:16 AM  Result Value Ref Range   WBC 7.8 4.0 - 10.5 K/uL   RBC 4.30 3.87 - 5.11 MIL/uL   Hemoglobin 9.3 (L) 12.0 - 15.0 g/dL   HCT 30.3 (L) 36.0 - 46.0 %    MCV 70.5 (L) 80.0 - 100.0 fL   MCH 21.6 (L) 26.0 - 34.0 pg   MCHC 30.7 30.0 - 36.0 g/dL   RDW 19.4 (H) 11.5 - 15.5 %   Platelets 494 (H) 150 - 400 K/uL   nRBC 0.0 0.0 - 0.2 %  hCG, quantitative, pregnancy   Collection Time: 05/28/19 11:16 AM  Result Value Ref Range   hCG, Beta Chain, Quant, S 29,803 (H) <5 mIU/mL    Ultrasound Studies:   US Ob Transvaginal  Result Date: 05/28/2019 CLINICAL DATA:  Bleeding EXAM: TRANSVAGINAL OB ULTRASOUND TECHNIQUE: Transvaginal ultrasound was performed for complete evaluation of the gestation as well as the maternal uterus, adnexal regions, and pelvic cul-de-sac. COMPARISON:  None. FINDINGS: Intrauterine gestational sac: Single Yolk sac:  Visualized Embryo:  Visualized Cardiac Activity: Lysed Heart Rate: 122 bpm MSD:   mm    w     d CRL:   6.7 mm   6 w 3 d                  [place] EDC: 01/18/2020 Subchorionic hemorrhage:  None visualized. Maternal uterus/adnexae: Trace simple appearing free fluid in the cul-de-sac. Maternal ovaries appear normal and there is no mass or free fluid identified within either adnexal region. IMPRESSION: 1. Single live intrauterine pregnancy with estimated gestational age of [redacted] weeks and 3 days. No subchorionic hemorrhage seen. 2. No adnexal mass or free fluid. Electronically  Signed   By: Franki Cabot M.D.   On: 05/28/2019 12:15   MAU course/MDM: Quantitative hCG, [place], CBC ordered.   Vaginal bleeding in early pregnancy with normal IUP and hemodynamically stable. Uncertain etiology of bleeding. A pos.   Assessment: [redacted]w[redacted]d week IUP Vaginal bleeding first trimester  Plan: Discharge home in stable condition. Bleeding and first trimester precautions Follow-up [facility] Follow up on 06/17/2019.   Specialty: Obstetrics and Gynecology Why: as scheduled or sooner as needed if symptoms worsen Contact information: [address]. [address] [place]  [address] [phone]       [facility] Follow up.   Specialty: Obstetrics and Gynecology Why: as needed in pregnancy emergencies Contact information: [address] [phone] [place] [phone]         Allergies as of 05/28/2019      Reactions   Other       Medication List    STOP taking these medications   terconazole 0.4 % vaginal cream Commonly known as: Terazol 7   triamcinolone cream 0.1 % Commonly known as: KENALOG     TAKE these medications   folic acid 1 MG tablet Commonly known as: FOLVITE Take 1 tablet (1 mg total) by mouth daily.   FUSION PO Take by mouth.   NIFEdipine 60 MG 24 hr tablet Commonly known as: Procardia XL Take 1 tablet (60 mg total) by mouth daily.   pantoprazole 20 MG tablet Commonly known as: PROTONIX Take 2 tablets (40 mg total) by mouth daily.       Tamala Julian, [place], [place] 05/28/2019, 12:34 PM  2/3

## 2019-05-28 NOTE — Discharge Instructions (Signed)
How a Baby Grows During Pregnancy  Pregnancy begins when a female's sperm enters a female's egg (fertilization). Fertilization usually happens in one of the tubes (fallopian tubes) that connect the ovaries to the womb (uterus). The fertilized egg moves down the fallopian tube to the uterus. Once it reaches the uterus, it implants into the lining of the uterus and begins to grow. For the first 10 weeks, the fertilized egg is called an embryo. After 10 weeks, it is called a fetus. As the fetus continues to grow, it receives oxygen and nutrients through tissue (placenta) that grows to support the developing baby. The placenta is the life support system for the baby. It provides oxygen and nutrition and removes waste. Learning as much as you can about your pregnancy and how your baby is developing can help you enjoy the experience. It can also make you aware of when there might be a problem and when to ask questions. How long does a typical pregnancy last? A pregnancy usually lasts 280 days, or about 40 weeks. Pregnancy is divided into three periods of growth, also called trimesters:  First trimester: 0-12 weeks.  Second trimester: 13-27 weeks.  Third trimester: 28-40 weeks. The day when your baby is ready to be born (full term) is your estimated date of delivery. How does my baby develop month by month? First month  The fertilized egg attaches to the inside of the uterus.  Some cells will form the placenta. Others will form the fetus.  The arms, legs, brain, spinal cord, lungs, and heart begin to develop.  At the end of the first month, the heart begins to beat. Second month  The bones, inner ear, eyelids, hands, and feet form.  The genitals develop.  By the end of 8 weeks, all major organs are developing. Third month  All of the internal organs are forming.  Teeth develop below the gums.  Bones and muscles begin to grow. The spine can flex.  The skin is transparent.  Fingernails  and toenails begin to form.  Arms and legs continue to grow longer, and hands and feet develop.  The fetus is about 3 inches (7.6 cm) long. Fourth month  The placenta is completely formed.  The external sex organs, neck, outer ear, eyebrows, eyelids, and fingernails are formed.  The fetus can hear, swallow, and move its arms and legs.  The kidneys begin to produce urine.  The skin is covered with a white, waxy coating (vernix) and very fine hair (lanugo). Fifth month  The fetus moves around more and can be felt for the first time (quickening).  The fetus starts to sleep and wake up and may begin to suck its finger.  The nails grow to the end of the fingers.  The organ in the digestive system that makes bile (gallbladder) functions and helps to digest nutrients.  If your baby is a girl, eggs are present in her ovaries. If your baby is a boy, testicles start to move down into his scrotum. Sixth month  The lungs are formed.  The eyes open. The brain continues to develop.  Your baby has fingerprints and toe prints. Your baby's hair grows thicker.  At the end of the second trimester, the fetus is about 9 inches (22.9 cm) long. Seventh month  The fetus kicks and stretches.  The eyes are developed enough to sense changes in light.  The hands can make a grasping motion.  The fetus responds to sound. Eighth month  All  organs and body systems are fully developed and functioning.  Bones harden, and taste buds develop. The fetus may hiccup.  Certain areas of the brain are still developing. The skull remains soft. Ninth month  The fetus gains about  lb (0.23 kg) each week.  The lungs are fully developed.  Patterns of sleep develop.  The fetus's head typically moves into a head-down position (vertex) in the uterus to prepare for birth.  The fetus weighs 6-9 lb (2.72-4.08 kg) and is 19-20 inches (48.26-50.8 cm) long. What can I do to have a healthy pregnancy and help  my baby develop? General instructions  Take prenatal vitamins as directed by your health care provider. These include vitamins such as folic acid, iron, calcium, and vitamin D. They are important for healthy development.  Take medicines only as directed by your health care provider. Read labels and ask a pharmacist or your health care provider whether over-the-counter medicines, supplements, and prescription drugs are safe to take during pregnancy.  Keep all follow-up visits as directed by your health care provider. This is important. Follow-up visits include prenatal care and screening tests. How do I know if my baby is developing well? At each prenatal visit, your health care provider will do several different tests to check on your health and keep track of your baby's development. These include:  Fundal height and position. ? Your health care provider will measure your growing belly from your pubic bone to the top of the uterus using a tape measure. ? Your health care provider will also feel your belly to determine your baby's position.  Heartbeat. ? An ultrasound in the first trimester can confirm pregnancy and show a heartbeat, depending on how far along you are. ? Your health care provider will check your baby's heart rate at every prenatal visit.  Second trimester ultrasound. ? This ultrasound checks your baby's development. It also may show your baby's gender. What should I do if I have concerns about my baby's development? Always talk with your health care provider about any concerns that you may have about your pregnancy and your baby. Summary  A pregnancy usually lasts 280 days, or about 40 weeks. Pregnancy is divided into three periods of growth, also called trimesters.  Your health care provider will monitor your baby's growth and development throughout your pregnancy.  Follow your health care provider's recommendations about taking prenatal vitamins and medicines during  your pregnancy.  Talk with your health care provider if you have any concerns about your pregnancy or your developing baby. This information is not intended to replace advice given to you by your health care provider. Make sure you discuss any questions you have with your health care provider. Document Released: 04/11/2008 Document Revised: 02/14/2019 Document Reviewed: 09/06/2017 Elsevier Patient Education  2020 [employer].

## 2019-06-04 ENCOUNTER — Ambulatory Visit (INDEPENDENT_AMBULATORY_CARE_PROVIDER_SITE_OTHER): Payer: No Typology Code available for payment source | Admitting: Internal Medicine

## 2019-06-04 ENCOUNTER — Other Ambulatory Visit: Payer: Self-pay

## 2019-06-04 ENCOUNTER — Encounter: Payer: Self-pay | Admitting: Internal Medicine

## 2019-06-04 VITALS — BP 114/78 | HR 78 | Temp 98.4°F | Ht 61.0 in | Wt 234.0 lb

## 2019-06-04 DIAGNOSIS — Z3491 Encounter for supervision of normal pregnancy, unspecified, first trimester: Secondary | ICD-10-CM

## 2019-06-04 DIAGNOSIS — I10 Essential (primary) hypertension: Secondary | ICD-10-CM | POA: Diagnosis not present

## 2019-06-04 DIAGNOSIS — Z331 Pregnant state, incidental: Secondary | ICD-10-CM

## 2019-06-04 MED ORDER — NIFEDIPINE ER OSMOTIC RELEASE 60 MG PO TB24: 60.0000 mg | ORAL_TABLET | Freq: Every day | ORAL | 1 refills | Status: DC

## 2019-06-04 MED FILL — NIFEDIPINE ER OSMOTIC RELEA: 60 | 90 days supply | Qty: 90 | Fill #0

## 2019-06-04 NOTE — Patient Instructions (Signed)
Exercising to Stay Healthy To become healthy and stay healthy, it is recommended that you do moderate-intensity and vigorous-intensity exercise. You can tell that you are exercising at a moderate intensity if your heart starts beating faster and you start breathing faster but can still hold a conversation. You can tell that you are exercising at a vigorous intensity if you are breathing much harder and faster and cannot hold a conversation while exercising. Exercising regularly is important. It has many health benefits, such as:  Improving overall fitness, flexibility, and endurance.  Increasing bone density.  Helping with weight control.  Decreasing body fat.  Increasing muscle strength.  Reducing stress and tension.  Improving overall health. How often should I exercise? Choose an activity that you enjoy, and set realistic goals. Your health care provider can help you make an activity plan that works for you. Exercise regularly as told by your health care provider. This may include:  Doing strength training two times a week, such as: ? Lifting weights. ? Using resistance bands. ? Push-ups. ? Sit-ups. ? Yoga.  Doing a certain intensity of exercise for a given amount of time. Choose from these options: ? A total of 150 minutes of moderate-intensity exercise every week. ? A total of 75 minutes of vigorous-intensity exercise every week. ? A mix of moderate-intensity and vigorous-intensity exercise every week. Children, pregnant women, people who have not exercised regularly, people who are overweight, and older adults may need to talk with a health care provider about what activities are safe to do. If you have a medical condition, be sure to talk with your health care provider before you start a new exercise program. What are some exercise ideas? Moderate-intensity exercise ideas include:  Walking 1 mile (1.6 km) in about 15 minutes.  Biking.  Hiking.  Golfing.  Dancing.   Water aerobics. Vigorous-intensity exercise ideas include:  Walking 4.5 miles (7.2 km) or more in about 1 hour.  Jogging or running 5 miles (8 km) in about 1 hour.  Biking 10 miles (16.1 km) or more in about 1 hour.  Lap swimming.  Roller-skating or in-line skating.  Cross-country skiing.  Vigorous competitive sports, such as football, basketball, and soccer.  Jumping rope.  Aerobic dancing. What are some everyday activities that can help me to get exercise?  Yard work, such as: ? Pushing a lawn mower. ? Raking and bagging leaves.  Washing your car.  Pushing a stroller.  Shoveling snow.  Gardening.  Washing windows or floors. How can I be more active in my day-to-day activities?  Use stairs instead of an elevator.  Take a walk during your lunch break.  If you drive, park your car farther away from your work or school.  If you take public transportation, get off one stop early and walk the rest of the way.  Stand up or walk around during all of your indoor phone calls.  Get up, stretch, and walk around every 30 minutes throughout the day.  Enjoy exercise with a friend. Support to continue exercising will help you keep a regular routine of activity. What guidelines can I follow while exercising?  Before you start a new exercise program, talk with your health care provider.  Do not exercise so much that you hurt yourself, feel dizzy, or get very short of breath.  Wear comfortable clothes and wear shoes with good support.  Drink plenty of water while you exercise to prevent dehydration or heat stroke.  Work out until your breathing   and your heartbeat get faster. Where to find more information  [place] Department of Health and Human Services: www.hhs.gov  Centers for Disease Control and Prevention (CDC): www.cdc.gov Summary  Exercising regularly is important. It will improve your overall fitness, flexibility, and endurance.  Regular exercise also will  improve your overall health. It can help you control your weight, reduce stress, and improve your bone density.  Do not exercise so much that you hurt yourself, feel dizzy, or get very short of breath.  Before you start a new exercise program, talk with your health care provider. This information is not intended to replace advice given to you by your health care provider. Make sure you discuss any questions you have with your health care provider. Document Released: 11/26/2010 Document Revised: 10/06/2017 Document Reviewed: 09/14/2017 Elsevier Patient Education  2020 [employer] Inc.  

## 2019-06-04 NOTE — Progress Notes (Signed)
  Subjective:     Patient ID: Angel Byrd , female    DOB: September 03, 1996 , 23 y.o.   MRN: [id number]   Chief Complaint  Patient presents with  . Hypertension    HPI  She is here today for bp check. Meds changed at last visit due to pregnancy. She has tolerated nifedipine without any issues. She has yet to meet with OB. She has appt scheduled for August 10th.     Past Medical History:  Diagnosis Date  . HTN (hypertension)   . Iron deficiency anemia      Family History  Problem Relation Age of Onset  . Sickle cell anemia Mother   . Heart disease Father   . Hypertension Sister      Current Outpatient Medications:  .  folic acid (FOLVITE) 1 MG tablet, Take 1 tablet (1 mg total) by mouth daily., Disp: 30 tablet, Rfl: 11 .  Fe Fum-Fe Poly-Vit C-Lactobac (FUSION PO), Take by mouth., Disp: , Rfl:  .  NIFEdipine (PROCARDIA XL) 60 MG 24 hr tablet, Take 1 tablet (60 mg total) by mouth daily., Disp: 90 tablet, Rfl: 1 .  Prenatal Vit-Fe Fumarate-FA (PRENATAL MULTIVITAMIN) TABS tablet, Take 1 tablet by mouth daily at 12 noon., Disp: , Rfl:    Allergies  Allergen Reactions  . Other      Review of Systems  Constitutional: Negative.   Respiratory: Negative.   Cardiovascular: Negative.   Gastrointestinal: Negative.   Neurological: Negative.   Psychiatric/Behavioral: Negative.      Today's Vitals   06/04/19 1208  BP: 114/78  Pulse: 78  Temp: 98.4 F (36.9 C)  TempSrc: Oral  Weight: 234 lb (106.1 kg)  Height: 5\' 1"  (1.549 m)   Body mass index is 44.21 kg/m.   Objective:  Physical Exam Vitals signs and nursing note reviewed.  Constitutional:      Appearance: Normal appearance.  HENT:     Head: Normocephalic and atraumatic.  Cardiovascular:     Rate and Rhythm: Normal rate and regular rhythm.     Heart sounds: Normal heart sounds.  Pulmonary:     Effort: Pulmonary effort is normal.     Breath sounds: Normal breath sounds.  Skin:    General: Skin is warm.   Neurological:     General: No focal deficit present.     Mental Status: She is alert.  Psychiatric:        Mood and Affect: Mood normal.        Behavior: Behavior normal.         Assessment And Plan:     1. Essential hypertension, benign  Well controlled. She will continue with current meds.  A refill was sent to the pharmacy. She plans to f/u with her OB. Pt encouraged to rto in 3  2. Pregnant and not yet delivered in first trimester  She is scheduled to see her OB on August 10th. She reports compliance with prenatal vitamins.   [name], MD    THE PATIENT IS ENCOURAGED TO PRACTICE SOCIAL DISTANCING DUE TO THE COVID-19 PANDEMIC.

## 2019-06-10 ENCOUNTER — Encounter: Payer: Self-pay | Admitting: Internal Medicine

## 2019-06-11 ENCOUNTER — Telehealth: Payer: Self-pay

## 2019-06-11 NOTE — Telephone Encounter (Signed)
The patient was notified that her FLMA form has been completed and faxed to [employer] and a copy has been given to her sister Carolyne Fiscal.

## 2019-06-21 MED FILL — PENICILLIN VK 500 MG TABLET: 500 | 7 days supply | Qty: 14 | Fill #0

## 2019-07-24 ENCOUNTER — Telehealth: Payer: Self-pay | Admitting: Family

## 2019-07-24 NOTE — Telephone Encounter (Signed)
Called and LMVM for patient w/ date/time.

## 2019-07-29 ENCOUNTER — Other Ambulatory Visit: Payer: No Typology Code available for payment source

## 2019-07-29 ENCOUNTER — Ambulatory Visit: Payer: No Typology Code available for payment source | Admitting: Family

## 2019-08-29 ENCOUNTER — Inpatient Hospital Stay: Payer: No Typology Code available for payment source | Attending: Family

## 2019-08-29 ENCOUNTER — Encounter: Payer: Self-pay | Admitting: Family

## 2019-08-29 ENCOUNTER — Other Ambulatory Visit: Payer: Self-pay

## 2019-08-29 ENCOUNTER — Inpatient Hospital Stay (HOSPITAL_BASED_OUTPATIENT_CLINIC_OR_DEPARTMENT_OTHER): Payer: No Typology Code available for payment source | Admitting: Family

## 2019-08-29 VITALS — BP 123/82 | HR 95 | Temp 97.1°F | Resp 19 | Wt 235.8 lb

## 2019-08-29 DIAGNOSIS — R5383 Other fatigue: Secondary | ICD-10-CM | POA: Diagnosis not present

## 2019-08-29 DIAGNOSIS — E611 Iron deficiency: Secondary | ICD-10-CM | POA: Diagnosis not present

## 2019-08-29 DIAGNOSIS — O99012 Anemia complicating pregnancy, second trimester: Secondary | ICD-10-CM | POA: Insufficient documentation

## 2019-08-29 DIAGNOSIS — R110 Nausea: Secondary | ICD-10-CM | POA: Insufficient documentation

## 2019-08-29 DIAGNOSIS — D573 Sickle-cell trait: Secondary | ICD-10-CM

## 2019-08-29 DIAGNOSIS — Z79899 Other long term (current) drug therapy: Secondary | ICD-10-CM | POA: Diagnosis not present

## 2019-08-29 DIAGNOSIS — D500 Iron deficiency anemia secondary to blood loss (chronic): Secondary | ICD-10-CM

## 2019-08-29 DIAGNOSIS — Z3A00 Weeks of gestation of pregnancy not specified: Secondary | ICD-10-CM | POA: Diagnosis not present

## 2019-08-29 LAB — CMP (CANCER CENTER ONLY)
ALT: 10 U/L (ref 0–44)
AST: 11 U/L — ABNORMAL LOW (ref 15–41)
Albumin: 4 g/dL (ref 3.5–5.0)
Alkaline Phosphatase: 74 U/L (ref 38–126)
Anion gap: 9 (ref 5–15)
BUN: 4 mg/dL — ABNORMAL LOW (ref 6–20)
CO2: 23 mmol/L (ref 22–32)
Calcium: 9.5 mg/dL (ref 8.9–10.3)
Chloride: 105 mmol/L (ref 98–111)
Creatinine: 0.68 mg/dL (ref 0.44–1.00)
GFR, Est AFR Am: >60 mL/min (ref >60)
GFR, Estimated: >60 mL/min (ref >60)
Glucose, Bld: 95 mg/dL (ref 70–99)
Potassium: 3.5 mmol/L (ref 3.5–5.1)
Sodium: 137 mmol/L (ref 135–145)
Total Bilirubin: 0.3 mg/dL (ref 0.3–1.2)
Total Protein: 7.2 g/dL (ref 6.5–8.1)

## 2019-08-29 LAB — IRON AND TIBC
Iron: 96 ug/dL (ref 41–142)
Saturation Ratios: 31 % (ref 21–57)
TIBC: 313 ug/dL (ref 236–444)
UIBC: 216 ug/dL (ref 120–384)

## 2019-08-29 LAB — CBC WITH DIFFERENTIAL (CANCER CENTER ONLY)
Abs Immature Granulocytes: 0.08 10*3/uL — ABNORMAL HIGH (ref 0.00–0.07)
Basophils Absolute: 0 10*3/uL (ref 0.0–0.1)
Basophils Relative: 0 %
Eosinophils Absolute: 0.1 10*3/uL (ref 0.0–0.5)
Eosinophils Relative: 1 %
HCT: 33.3 % — ABNORMAL LOW (ref 36.0–46.0)
Hemoglobin: 10.8 g/dL — ABNORMAL LOW (ref 12.0–15.0)
Immature Granulocytes: 1 %
Lymphocytes Relative: 23 %
Lymphs Abs: 2 10*3/uL (ref 0.7–4.0)
MCH: 25.4 pg — ABNORMAL LOW (ref 26.0–34.0)
MCHC: 32.4 g/dL (ref 30.0–36.0)
MCV: 78.2 fL — ABNORMAL LOW (ref 80.0–100.0)
Monocytes Absolute: 0.4 10*3/uL (ref 0.1–1.0)
Monocytes Relative: 5 %
Neutro Abs: 6.2 10*3/uL (ref 1.7–7.7)
Neutrophils Relative %: 70 %
Platelet Count: 387 10*3/uL (ref 150–400)
RBC: 4.26 MIL/uL (ref 3.87–5.11)
RDW: 17.1 % — ABNORMAL HIGH (ref 11.5–15.5)
WBC Count: 8.9 10*3/uL (ref 4.0–10.5)
nRBC: 0 % (ref 0.0–0.2)

## 2019-08-29 LAB — FERRITIN: Ferritin: 17 ng/mL (ref 11–307)

## 2019-08-29 LAB — RETICULOCYTES
Immature Retic Fract: 15.2 % (ref 2.3–15.9)
RBC.: 4.31 MIL/uL (ref 3.87–5.11)
Retic Count, Absolute: 69 10*3/uL (ref 19.0–186.0)
Retic Ct Pct: 1.6 % (ref 0.4–3.1)

## 2019-08-29 LAB — SAVE SMEAR(SSMR), FOR PROVIDER SLIDE REVIEW

## 2019-08-29 NOTE — Progress Notes (Signed)
Hematology and Oncology Follow Up Visit  Angel Byrd [id number] Nov 02, 1996 23 y.o. 08/29/2019   Principle Diagnosis:  Iron deficiency anemia  Sickle cell trait  Current Therapy:   Fusion plus PO daily  Folic acid 1 mg PO daily   Interim History:  Angel Byrd is here today for follow-up. She is doing quite well and is now 5 months pregnant. She is due March 9th 2021.  So far she has had a good pregnancy with only mild fatigue and minimal nausea.  She states that she is taking her fusion plus supplement and folic acid daily as prescribed.  Hgb is now up to 10.8, MCV 78, platelets 387.  No episodes of bleeding. No bruising or petechiae.  No fever, chills, vomiting, cough, rash, dizziness, chest pain, palpitations, abdominal pain or changes in bowel or bladder habits.  No swelling, tenderness, numbness or tingling in her extremities.  No falls or syncope.  She is eating well and staying hydrated. Her weight is stable.   ECOG Performance Status: 1 - Symptomatic but completely ambulatory  Medications:  Allergies as of 08/29/2019      Reactions   Other       Medication List       Accurate as of August 29, 2019  8:55 AM. If you have any questions, ask your nurse or doctor.        folic acid 1 MG tablet Commonly known as: FOLVITE Take 1 tablet (1 mg total) by mouth daily.   FUSION PO Take by mouth.   NIFEdipine 60 MG 24 hr tablet Commonly known as: Procardia XL Take 1 tablet (60 mg total) by mouth daily.   prenatal multivitamin Tabs tablet Take 1 tablet by mouth daily at 12 noon.       Allergies:  Allergies  Allergen Reactions  . Other     Past Medical History, Surgical history, Social history, and Family History were reviewed and updated.  Review of Systems: All other 10 point review of systems is negative.   Physical Exam:  vitals were not taken for this visit.   Wt Readings from Last 3 Encounters:  06/04/19 234 lb (106.1 kg)  05/28/19 231 lb  14.4 oz (105.2 kg)  05/14/19 230 lb 8 oz (104.6 kg)    Ocular: Sclerae unicteric, pupils equal, round and reactive to light Ear-nose-throat: Oropharynx clear, dentition fair Lymphatic: No cervical or supraclavicular adenopathy Lungs no rales or rhonchi, good excursion bilaterally Heart regular rate and rhythm, no murmur appreciated Abd soft, nontender, positive bowel sounds, no liver or spleen tip palpated on exam, no fluid wave  MSK no focal spinal tenderness, no joint edema Neuro: non-focal, well-oriented, appropriate affect Breasts: Deferred   Lab Results  Component Value Date   WBC 8.9 08/29/2019   HGB 10.8 (L) 08/29/2019   HCT 33.3 (L) 08/29/2019   MCV 78.2 (L) 08/29/2019   PLT 387 08/29/2019   Lab Results  Component Value Date   FERRITIN 7 (L) 03/11/2019   IRON 26 (L) 03/11/2019   TIBC 312 03/11/2019   UIBC 286 03/11/2019   IRONPCTSAT 8 (LL) 03/11/2019   Lab Results  Component Value Date   RETICCTPCT 1.6 08/29/2019   RBC 4.31 08/29/2019   No results found for: KPAFRELGTCHN, LAMBDASER, KAPLAMBRATIO No results found for: IGGSERUM, IGA, IGMSERUM No results found for: TOTALPROTELP, ALBUMINELP, A1GS, A2GS, BETS, BETA2SER, GAMS, MSPIKE, SPEI   Chemistry      Component Value Date/Time   NA 137 05/14/2019 1640  NA 138 03/11/2019 1322   K 3.0 (L) 05/14/2019 1640   CL 106 05/14/2019 1640   CO2 22 05/14/2019 1640   BUN <5 (L) 05/14/2019 1640   BUN 11 03/11/2019 1322   CREATININE 0.69 05/14/2019 1640   CREATININE 0.98 12/31/2018 1258   GLU 82 05/09/2018      Component Value Date/Time   CALCIUM 9.2 05/14/2019 1640   ALKPHOS 64 05/14/2019 1640   AST 18 05/14/2019 1640   AST 9 (L) 12/31/2018 1258   ALT 15 05/14/2019 1640   ALT 6 12/31/2018 1258   BILITOT 0.8 05/14/2019 1640   BILITOT 0.3 12/31/2018 1258       Impression and Plan: Angel Byrd is a very pleasant 23 yo African American female with iron deficiency anemia as well as the sickle cell trait.  She is  doing quite well and excited to share that she is now five months pregnant due March 9th, 2021.  She will continue her fusion plus and folic acid supplements daily.  We will plan to see her back in another 3 months.  She will contact our office with any questions or concerns. We can certainly see her sooner if needed.   [name], NP 10/22/20208:55 AM

## 2019-09-06 MED FILL — NIFEDIPINE ER OSMOTIC RELEA: 60 | 90 days supply | Qty: 90 | Fill #1

## 2019-09-11 ENCOUNTER — Other Ambulatory Visit: Payer: Self-pay

## 2019-09-11 DIAGNOSIS — Z20822 Contact with and (suspected) exposure to covid-19: Secondary | ICD-10-CM

## 2019-09-12 LAB — NOVEL CORONAVIRUS, NAA: SARS-CoV-2, NAA: NOT DETECTED

## 2019-11-13 ENCOUNTER — Other Ambulatory Visit: Payer: No Typology Code available for payment source

## 2019-11-14 ENCOUNTER — Ambulatory Visit: Payer: No Typology Code available for payment source | Attending: Internal Medicine

## 2019-11-18 ENCOUNTER — Inpatient Hospital Stay (HOSPITAL_COMMUNITY)
Admission: AD | Admit: 2019-11-18 | Discharge: 2019-11-25 | DRG: 787 | Disposition: A | Discharge status: Home / Self Care | Payer: No Typology Code available for payment source | Attending: Obstetrics and Gynecology | Admitting: Obstetrics and Gynecology

## 2019-11-18 ENCOUNTER — Other Ambulatory Visit: Payer: Self-pay

## 2019-11-18 ENCOUNTER — Encounter (HOSPITAL_COMMUNITY): Payer: Self-pay | Admitting: Obstetrics and Gynecology

## 2019-11-18 ENCOUNTER — Inpatient Hospital Stay (HOSPITAL_COMMUNITY): Payer: No Typology Code available for payment source

## 2019-11-18 DIAGNOSIS — O10919 Unspecified pre-existing hypertension complicating pregnancy, unspecified trimester: Secondary | ICD-10-CM | POA: Diagnosis present

## 2019-11-18 DIAGNOSIS — O322XX0 Maternal care for transverse and oblique lie, not applicable or unspecified: Secondary | ICD-10-CM | POA: Diagnosis present

## 2019-11-18 DIAGNOSIS — Z8759 Personal history of other complications of pregnancy, childbirth and the puerperium: Secondary | ICD-10-CM

## 2019-11-18 DIAGNOSIS — O99013 Anemia complicating pregnancy, third trimester: Secondary | ICD-10-CM

## 2019-11-18 DIAGNOSIS — O99213 Obesity complicating pregnancy, third trimester: Secondary | ICD-10-CM

## 2019-11-18 DIAGNOSIS — O42919 Preterm premature rupture of membranes, unspecified as to length of time between rupture and onset of labor, unspecified trimester: Secondary | ICD-10-CM | POA: Diagnosis not present

## 2019-11-18 DIAGNOSIS — Z20822 Contact with and (suspected) exposure to covid-19: Secondary | ICD-10-CM | POA: Diagnosis present

## 2019-11-18 DIAGNOSIS — Z3A31 31 weeks gestation of pregnancy: Secondary | ICD-10-CM | POA: Diagnosis not present

## 2019-11-18 DIAGNOSIS — Z3A32 32 weeks gestation of pregnancy: Secondary | ICD-10-CM | POA: Diagnosis not present

## 2019-11-18 DIAGNOSIS — D573 Sickle-cell trait: Secondary | ICD-10-CM | POA: Diagnosis present

## 2019-11-18 DIAGNOSIS — O365930 Maternal care for other known or suspected poor fetal growth, third trimester, not applicable or unspecified: Secondary | ICD-10-CM | POA: Diagnosis present

## 2019-11-18 DIAGNOSIS — O4103X0 Oligohydramnios, third trimester, not applicable or unspecified: Secondary | ICD-10-CM | POA: Diagnosis not present

## 2019-11-18 DIAGNOSIS — O99824 Streptococcus B carrier state complicating childbirth: Secondary | ICD-10-CM | POA: Diagnosis present

## 2019-11-18 DIAGNOSIS — O42913 Preterm premature rupture of membranes, unspecified as to length of time between rupture and onset of labor, third trimester: Secondary | ICD-10-CM

## 2019-11-18 DIAGNOSIS — O99214 Obesity complicating childbirth: Secondary | ICD-10-CM | POA: Diagnosis present

## 2019-11-18 DIAGNOSIS — O365990 Maternal care for other known or suspected poor fetal growth, unspecified trimester, not applicable or unspecified: Secondary | ICD-10-CM | POA: Diagnosis present

## 2019-11-18 DIAGNOSIS — O10013 Pre-existing essential hypertension complicating pregnancy, third trimester: Secondary | ICD-10-CM | POA: Diagnosis not present

## 2019-11-18 DIAGNOSIS — D509 Iron deficiency anemia, unspecified: Secondary | ICD-10-CM | POA: Diagnosis present

## 2019-11-18 DIAGNOSIS — O1002 Pre-existing essential hypertension complicating childbirth: Secondary | ICD-10-CM | POA: Diagnosis present

## 2019-11-18 DIAGNOSIS — O328XX0 Maternal care for other malpresentation of fetus, not applicable or unspecified: Secondary | ICD-10-CM | POA: Diagnosis present

## 2019-11-18 DIAGNOSIS — Z6841 Body Mass Index (BMI) 40.0 and over, adult: Secondary | ICD-10-CM

## 2019-11-18 DIAGNOSIS — O4100X0 Oligohydramnios, unspecified trimester, not applicable or unspecified: Secondary | ICD-10-CM | POA: Diagnosis present

## 2019-11-18 DIAGNOSIS — O9902 Anemia complicating childbirth: Secondary | ICD-10-CM | POA: Diagnosis not present

## 2019-11-18 LAB — COMPREHENSIVE METABOLIC PANEL
ALT: 12 U/L (ref 0–44)
AST: 13 U/L — ABNORMAL LOW (ref 15–41)
Albumin: 2.8 g/dL — ABNORMAL LOW (ref 3.5–5.0)
Alkaline Phosphatase: 272 U/L — ABNORMAL HIGH (ref 38–126)
Anion gap: 9 (ref 5–15)
BUN: <5 mg/dL — ABNORMAL LOW (ref 6–20)
CO2: 21 mmol/L — ABNORMAL LOW (ref 22–32)
Calcium: 9 mg/dL (ref 8.9–10.3)
Chloride: 105 mmol/L (ref 98–111)
Creatinine, Ser: 0.52 mg/dL (ref 0.44–1.00)
GFR calc Af Amer: >60 mL/min (ref >60)
GFR calc non Af Amer: >60 mL/min (ref >60)
Glucose, Bld: 100 mg/dL — ABNORMAL HIGH (ref 70–99)
Potassium: 3.4 mmol/L — ABNORMAL LOW (ref 3.5–5.1)
Sodium: 135 mmol/L (ref 135–145)
Total Bilirubin: 0.5 mg/dL (ref 0.3–1.2)
Total Protein: 6.9 g/dL (ref 6.5–8.1)

## 2019-11-18 LAB — CBC
HCT: 36.4 % (ref 36.0–46.0)
Hemoglobin: 12 g/dL (ref 12.0–15.0)
MCH: 27.5 pg (ref 26.0–34.0)
MCHC: 33 g/dL (ref 30.0–36.0)
MCV: 83.3 fL (ref 80.0–100.0)
Platelets: 373 10*3/uL (ref 150–400)
RBC: 4.37 MIL/uL (ref 3.87–5.11)
RDW: 14.5 % (ref 11.5–15.5)
WBC: 11.7 10*3/uL — ABNORMAL HIGH (ref 4.0–10.5)
nRBC: 0 % (ref 0.0–0.2)

## 2019-11-18 LAB — PROTEIN / CREATININE RATIO, URINE
Creatinine, Urine: 54.93 mg/dL
Protein Creatinine Ratio: 0.16 mg/mg{Cre} — ABNORMAL HIGH (ref 0.00–0.15)
Total Protein, Urine: 9 mg/dL

## 2019-11-18 LAB — SARS CORONAVIRUS 2 (TAT 6-24 HRS): SARS Coronavirus 2: NEGATIVE

## 2019-11-18 MED ORDER — BETAMETHASONE SOD PHOS & ACET 6 (3-3) MG/ML IJ SUSP
12.0000 mg | INTRAMUSCULAR | Status: AC
  Administered 2019-11-18 – 2019-11-19 (×2): 12 mg via INTRAMUSCULAR
  Filled 2019-11-18: qty 5

## 2019-11-18 MED ORDER — LACTATED RINGERS IV SOLN
INTRAVENOUS | Status: DC
  Administered 2019-11-19 – 2019-11-20 (×2): 125 mL/h via INTRAVENOUS

## 2019-11-18 MED ORDER — SODIUM CHLORIDE 0.9 % IV SOLN
2.0000 g | Freq: Four times a day (QID) | INTRAVENOUS | Status: AC
  Administered 2019-11-18 – 2019-11-20 (×8): 2 g via INTRAVENOUS
  Filled 2019-11-18 (×8): qty 2000

## 2019-11-18 MED ORDER — AMOXICILLIN 500 MG PO CAPS
500.0000 mg | ORAL_CAPSULE | Freq: Three times a day (TID) | ORAL | Status: DC
  Administered 2019-11-20 – 2019-11-22 (×6): 500 mg via ORAL
  Filled 2019-11-18 (×8): qty 1

## 2019-11-18 MED ORDER — NIFEDIPINE ER OSMOTIC RELEASE 60 MG PO TB24
60.0000 mg | ORAL_TABLET | Freq: Every day | ORAL | Status: DC
  Administered 2019-11-19 – 2019-11-24 (×6): 60 mg via ORAL
  Filled 2019-11-18: qty 2
  Filled 2019-11-18: qty 1
  Filled 2019-11-18 (×2): qty 2
  Filled 2019-11-18 (×3): qty 1
  Filled 2019-11-18: qty 2

## 2019-11-18 MED ORDER — DOCUSATE SODIUM 100 MG PO CAPS
100.0000 mg | ORAL_CAPSULE | Freq: Every day | ORAL | Status: DC
  Administered 2019-11-18 – 2019-11-21 (×4): 100 mg via ORAL
  Filled 2019-11-18 (×4): qty 1

## 2019-11-18 MED ORDER — AZITHROMYCIN 250 MG PO TABS
1000.0000 mg | ORAL_TABLET | Freq: Once | ORAL | Status: AC
  Administered 2019-11-18: 14:00:00 1000 mg via ORAL
  Filled 2019-11-18: qty 4

## 2019-11-18 MED ORDER — ACETAMINOPHEN 325 MG PO TABS: 650.0000 mg | ORAL_TABLET | ORAL | Status: DC | PRN

## 2019-11-18 MED ORDER — DEXTROSE IN LACTATED RINGERS 5 % IV SOLN: INTRAVENOUS | Status: DC

## 2019-11-18 MED ORDER — ZOLPIDEM TARTRATE 5 MG PO TABS
5.0000 mg | ORAL_TABLET | Freq: Every evening | ORAL | Status: DC | PRN
  Administered 2019-11-18 – 2019-11-21 (×4): 5 mg via ORAL
  Filled 2019-11-18 (×4): qty 1

## 2019-11-18 MED ORDER — PRENATAL MULTIVITAMIN CH
1.0000 | ORAL_TABLET | Freq: Every day | ORAL | Status: DC
  Administered 2019-11-18 – 2019-11-21 (×4): 1 via ORAL
  Filled 2019-11-18 (×5): qty 1

## 2019-11-18 MED ORDER — CALCIUM CARBONATE ANTACID 500 MG PO CHEW: 2.0000 | CHEWABLE_TABLET | ORAL | Status: DC | PRN

## 2019-11-18 NOTE — Plan of Care (Signed)
  Problem: Activity: Goal: Risk for activity intolerance will decrease Outcome: Completed/Met   Problem: Nutrition: Goal: Adequate nutrition will be maintained Outcome: Completed/Met   Problem: Coping: Goal: Level of anxiety will decrease Outcome: Completed/Met

## 2019-11-18 NOTE — H&P (Signed)
[name] is 11-25-1995  Patient's last menstrual period was 04/09/2019. [redacted]w[redacted]d  Pt presents with PPROM.  She ruptured this morning.  She was evaluated in the office and found to be ruputred  Pregnancy complicated by [facility] on procardia PMHX  Past Medical History:  Diagnosis Date  . HTN (hypertension)   . Iron deficiency anemia     PSURG HX  Past Surgical History:  Procedure Laterality Date  . NO PAST SURGERIES      Fam HX  Family History  Problem Relation Age of Onset  . Sickle cell anemia Mother   . Heart disease Father   . Hypertension Sister     Soc HX  Social History   Socioeconomic History  . Marital status: Single    Spouse name: Not on file  . Number of children: Not on file  . Years of education: Not on file  . Highest education level: Not on file  Occupational History  . Not on file  Tobacco Use  . Smoking status: Never Smoker  . Smokeless tobacco: Never Used  Substance and Sexual Activity  . Alcohol use: No  . Drug use: No  . Sexual activity: Yes    Birth control/protection: None  Other Topics Concern  . Not on file  Social History Narrative  . Not on file   Social Determinants of Health   Financial Resource Strain:   . Difficulty of Paying Living Expenses: Not on file  Food Insecurity:   . Worried About Charity fundraiser in the Last Year: Not on file  . Ran Out of Food in the Last Year: Not on file  Transportation Needs:   . Lack of Transportation (Medical): Not on file  . Lack of Transportation (Non-Medical): Not on file  Physical Activity:   . Days of Exercise per Week: Not on file  . Minutes of Exercise per Session: Not on file  Stress:   . Feeling of Stress : Not on file  Social Connections:   . Frequency of Communication with Friends and Family: Not on file  . Frequency of Social Gatherings with Friends and Family: Not on file  . Attends Religious Services: Not on file  . Active Member of Clubs or Organizations: Not on  file  . Attends Archivist Meetings: Not on file  . Marital Status: Not on file  Intimate Partner Violence:   . Fear of Current or Ex-Partner: Not on file  . Emotionally Abused: Not on file  . Physically Abused: Not on file  . Sexually Abused: Not on file    ROS  Review of Systems - Negative except CHTN    Gen WDWN  IN NAD CV RRR LUNGS CTAB ABD Gravid soft NT GU 1/50/-3 PER vl EXT NO CALF TENDERNESS B A/P: IUP [redacted]w[redacted]d SPPPROM Admit to antepartum IVF  ANTIBIOITICS [place] FOR EFW AND POSITION GBS DONE PT WITH CHT[facility].  PROCRDIA 60 XL ORDERED CHECK [facility] LABS CAT 1  BMZ

## 2019-11-19 ENCOUNTER — Inpatient Hospital Stay (HOSPITAL_COMMUNITY): Payer: No Typology Code available for payment source

## 2019-11-19 DIAGNOSIS — O42919 Preterm premature rupture of membranes, unspecified as to length of time between rupture and onset of labor, unspecified trimester: Secondary | ICD-10-CM

## 2019-11-19 DIAGNOSIS — O365930 Maternal care for other known or suspected poor fetal growth, third trimester, not applicable or unspecified: Secondary | ICD-10-CM

## 2019-11-19 DIAGNOSIS — O4100X0 Oligohydramnios, unspecified trimester, not applicable or unspecified: Secondary | ICD-10-CM | POA: Diagnosis present

## 2019-11-19 DIAGNOSIS — O4103X0 Oligohydramnios, third trimester, not applicable or unspecified: Secondary | ICD-10-CM

## 2019-11-19 DIAGNOSIS — O365990 Maternal care for other known or suspected poor fetal growth, unspecified trimester, not applicable or unspecified: Secondary | ICD-10-CM | POA: Diagnosis present

## 2019-11-19 DIAGNOSIS — Z3A32 32 weeks gestation of pregnancy: Secondary | ICD-10-CM

## 2019-11-19 LAB — URINE CULTURE: Culture: NO GROWTH

## 2019-11-19 LAB — CULTURE, BETA STREP (GROUP B ONLY)

## 2019-11-19 MED ORDER — ASPIRIN 81 MG PO CHEW
81.0000 mg | CHEWABLE_TABLET | Freq: Every day | ORAL | Status: DC
  Administered 2019-11-19 – 2019-11-21 (×3): 81 mg via ORAL
  Filled 2019-11-19 (×4): qty 1

## 2019-11-19 NOTE — Consult Note (Signed)
MFM Consult  This patient was seen in consultation due to PPROM.  She is currently at 32 weeks 0 days.  This is her first pregnancy.  The patient reports that she ruptured membranes yesterday.  She reports that she is still leaking a small amount of fluid and reports feeling fetal movements throughout the day.  She reports that she experienced vaginal bleeding earlier in her pregnancy.  Her cervix was 1 cm dilated at the time of admission yesterday.  She has a history of chronic hypertension that is currently treated with Procardia XL 60 mg daily.  She denies any other significant past medical history.  The patient had an ultrasound performed yesterday that showed an overall EFW of 3 pounds 7 ounces (8th percentile for her gestational age) indicating fetal growth restriction.  Low amniotic fluid with a total AFI of 5.34 cm was also noted.    Doppler studies of the umbilical arteries performed due to fetal growth restriction showed a normal S/D ratio of 2.53.  There were no signs of absent or reversed end-diastolic flow.  The fetus was in the vertex presentation.  Due to PPROM at her current gestational age, the patient is receiving latency antibiotics and a complete course of antenatal corticosteroids.  The usual management and implications of PPROM were discussed with the patient.  She was advised that due to rupture of membranes, she will require inpatient management until delivery with daily fetal testing.  She should receive a complete course of antenatal corticosteroids and complete a course of latency antibiotics.    She was advised that due to PPROM, delivery will be recommended at around 34 weeks.  Delivery prior to this time will be indicated should she go into spontaneous labor, should she show any signs of an intrauterine infection, or at any time for nonreassuring fetal status.  Due to fetal growth restriction, she should continue to be followed with weekly biophysical profiles and weekly  umbilical artery Doppler studies.  The patient was reassured that most babies delivered at around 34 weeks following PPROM generally do well.  However, her baby will require a NICU admission following delivery.  At the end of the consultation, the patient stated that all her questions had been answered to her complete satisfaction.    Thank you for referring this patient for Maternal-Fetal Medicine consultation.

## 2019-11-19 NOTE — Progress Notes (Addendum)
Hospital day # 1 pregnancy at [redacted]w[redacted]d--Admitted for PPROM on 1/11 @0900 , has CHTN controlled on procardia 60XLmg daily. H/O sickle cell trait, obesity, and anemia.   S:  Pt stable rested well last night, +FM, continues to feel small leakage of fluid from the vagina when she uses the bathroom. Denies HA, RUQ pain or vision changes. Reviewed POC and progress, questions answer, pt stable and denies further questions.       Perception of contractions: none      Vaginal bleeding: none now       Vaginal discharge:  watery and no significant change  O: BP 110/69 (BP Location: Right Arm)   Pulse 90   Temp 97.8 F (36.6 C) (Oral)   Resp 18   Ht 5' (1.524 m)   Wt 109.4 kg   LMP 04/09/2019   SpO2 99%   BMI 47.10 kg/m   Fetal tracings: Baseline 120s, +acels, - decels, reactive, Cat1 Contractions: None Uterus gravid, consistent with 31 weeks and non-tender Extremities: extremities normal, atraumatic, no cyanosis or edema, no edema, redness or tenderness in the calves or thighs and no significant edema and no signs of DVT  Neuro: 2+ Patellar DTR, no clonus           Labs:  AST 13, ALT 12, creatinine 0.52, PCR 0.16, Hgb 12, platelets 373, covid Neg, GBS+       Meds: Procardia 60xl mg daily, ampicillin 2gm Q6H.  A/P: Hospital day # 1 pregnancy at 105w0d--Admitted for PPROM on 1/11 @0900 , has CHTN      controlled on procardia 60XLmg daily.H/O sickle cell trait, obesity, and anemia.  PPROM: @ 0900 on 1/11, Oligohydramnios with AFI 5.34, received 1000mg  azithromycin on 1/11, one time dose, has been on ampicillin 2gm Q6H, which will be discontinued on 1/13 @ 1500, then started on PO amoxicillin 500mg  TID until 1/18 @1600 . GC/C/urine culture still pending results. MFM consult today.    GBS Positive: Plan for penicillin if pt labors.   CHTN: Stable, asymptomatic, BP 122/77, on procardia 60xl mg daily , plan to continue in-pt, monitor BP, labetalol protocol if indicated. Labs upon admission 1/11 for preE  unremarkable with PCR of 0.16. ASA 81mg  PO order daily.   Fetal wellbeing: NST reactive with Cat 1. EFW 3.7lbs, dx IUGR 8%, vertex on 1/11, BPP 6/8 minus for intermittent breathing, BMZ given 1355 on 1/11, due to second dose today @ 1400. NICU consult to be completed today. MFM recommend weekly BPP. Qshift NST recommended by Dr Charlesetta Garibaldi if Reactive strip over nigh, which it was. Dr Mancel Bale recommends today to place back on continuous if pt starts having cxts. .   Dr Mancel Bale up to day on pt status and also assume care of pt today @ 0700.   [place], MSN 11/19/2019 8:53 AM

## 2019-11-20 LAB — GC/CHLAMYDIA PROBE AMP (~~LOC~~) NOT AT ARMC
Chlamydia: NEGATIVE
Comment: NEGATIVE
Comment: NORMAL
Neisseria Gonorrhea: NEGATIVE

## 2019-11-20 MED ORDER — SODIUM CHLORIDE 0.9% FLUSH
3.0000 mL | Freq: Two times a day (BID) | INTRAVENOUS | Status: DC
  Administered 2019-11-20 – 2019-11-21 (×4): 3 mL via INTRAVENOUS

## 2019-11-20 NOTE — Progress Notes (Addendum)
S:  Doing well  and reports active FM. Denies leaking fluid since this AM, denies vaginal bleeding, denies malodorous discharge. Sister at bedside, pt consuming reg diet and states she has been resting well. Nursing request for saline lock today as pt is tolerating reg diet and and fluids w/o N/V.  O: Blood pressure 118/68, pulse (!) 106, temperature 99.7 F (37.6 C), temperature source Oral, resp. rate 18, height 5' (1.524 m), weight 111.7 kg, last menstrual period 04/09/2019, SpO2 97 %.  Abd: soft, gravid, non-tender GU: clear discharge w/ scant mucus FHR 155 baseline/ moderate variability/ accels present/ decels absent TOCO: irritability  Labs: Results for Angel, Byrd (MRN [id number]) as of 11/20/2019 17:23  Ref. Range 11/18/2019 13:00 11/18/2019 13:05 11/18/2019 14:44 11/18/2019 16:33 11/19/2019 09:17  Chlamydia Unknown     Negative  Neisseria Gonorrhea Unknown     Negative    A/P 24 y.o. G1P0 [redacted]w[redacted]d admitted to PPROM  PPROM: @ 0900 on 1/11, Oligohydramnios with AFI 5.34, received 1000mg  azithromycin on 1/11, one time dose, has been on ampicillin 2gm Q6H, which will be discontinued on 1/13 @ 1500, then started on PO amoxicillin 500mg  TID until 1/18 @1600 . MFM consult completed 11/19/19, rec IOL @ 34 wks   GBS Positive: Plan for penicillin if pt labors.   CHTN: Stable, asymptomatic, continue procardia 60xl mg daily , plan to continue in-pt, monitor BP, labetalol protocol if indicated. Labs upon admission 1/11 for preE unremarkable with PCR of 0.16.ASA 81mg  PO order daily.  Fetal wellbeing: NST reactive with Cat 1. EFW 3.7lbs, dx IUGR 8%, vertex on 1/11, BPP 6/8 minus for intermittent breathing, BMZ complete, NICU consult completed 11/19/19. MFM recommend weekly BPP. Qshift NSTrecommended by Dr Charlesetta Garibaldi. Dr Mancel Bale recommends continuous EFM if S/S of PTL noted.  Burman Foster, MSN, CNM 11/20/2019 5:26 PM   Reviewed and agreed Patient remains afebrile Tracings reviewed remotely:  reactive and reassuring

## 2019-11-20 NOTE — Progress Notes (Addendum)
Patient called out to report a "gush" vaginal drainage. Fluid was clear. Patient provided with pads and reassured to call if anything else changes.

## 2019-11-20 NOTE — Progress Notes (Addendum)
Hospital day #2 pregnancy at [redacted]w[redacted]d--Admitted for PPROM on 1/11 @0900 , has CHTN controlled on procardia 60XLmg daily. H/O sickle cell trait, obesity, and anemia  Subjective:    Doing well, reports a restful night, minimal loss of fluid, and active FM.  Objective:    VS: BP 122/74 (BP Location: Right Arm)   Pulse 96   Temp 98.1 F (36.7 C) (Oral)   Resp 18   Ht 5' (1.524 m)   Wt 111.7 kg Comment: standing scale  LMP 04/09/2019   SpO2 100%   BMI 48.09 kg/m  FHR : Reactive NST baseline 145 / variability moderate / accelerations present / absent decelerations Toco: soft abd, no contractions Membranes: clear fluid, no odor    Assessment/Plan:   24 y.o. G1P0 [redacted]w[redacted]d admitted for PPROM   PPROM: @ 0900 on 1/11, Oligohydramnios with AFI 5.34, received 1000mg  azithromycin on 1/11, one time dose, has been on ampicillin 2gm Q6H, which will be discontinued on 1/13 @ 1500, then started on PO amoxicillin 500mg  TID until 1/18 @1600 . GC/Chlam/urine culture results pending as of 11/20/19. MFM consult completed 11/19/19, rec IOL @ 34 wks    GBS Positive: Plan for penicillin if pt labors.   CHTN: Stable, asymptomatic, continue procardia 60xl mg daily , plan to continue in-pt, monitor BP, labetalol protocol if indicated. Labs upon admission 1/11 for preE unremarkable with PCR of 0.16. ASA 81mg  PO order daily.   Fetal wellbeing: NST reactive with Cat 1. EFW 3.7lbs, dx IUGR 8%, vertex on 1/11, BPP 6/8 minus for intermittent breathing, BMZ complete, NICU consult completed 11/19/19. MFM recommend weekly BPP. Qshift NST recommended by Dr Charlesetta Garibaldi. Dr Mancel Bale recommends continuous EFM if S/S of PTL noted.     Arrie Eastern CNM, MSN 11/20/2019 6:18 AM

## 2019-11-20 NOTE — Progress Notes (Signed)
Discussed with patient the need to drink 64 oz. Of water each day per Burman Foster, CNM.

## 2019-11-21 NOTE — Progress Notes (Signed)
Patient denies any more leakage of fluid since last night.

## 2019-11-21 NOTE — Progress Notes (Signed)
Hospital day #3pregnancy at [place] 2d--Admitted for PPROM on 1/11 @0900 , CHTN well-controlled on procardia 60XLmg daily.H/O sickle cell trait, obesity, and anemia  S: Reports intermittent, painful ctx this AM that resolved after 20-30 min. Comfortable now w/ active FM.   O: Blood pressure 127/73, pulse 86, temperature 98.1 F (36.7 C), temperature source Oral, resp. rate 18, height 5' (1.524 m), weight 111.7 kg, last menstrual period 04/09/2019, SpO2 97 %.  FHR: reactive NST, baseline 145/moderate variability/ accels present, decels absent TOCO: 2-5 @ the beginning of NST and then irregular at the end. Abd palpates soft now Abd: soft, gravid, non-tender GU: loss of scant clear fluid, no odor Ext: no swelling, tenderness, pain, or cords  Assessment/Plan:   24 y.o. G1P0 [redacted]w[redacted]d  PPROM 11/18/19 @ 0800   PPROM:  Oligohydramnios AFI 5.34, received 1000mg  azithromycin on 1/11 x1 dose, Ampicillin 2gm Q6 dc'd on 1/13 @ 1500,  PO amoxicillin 500mg  TID until 1/18 @1600 . MFM consult completed 11/19/19, rec IOL @ 34 wks   GBS Positive: Plan for penicillin if pt labors.   CHTN: Stable, asymptomatic, continue procardia 60xl mg daily, labetalol protocol if indicated. PreE admission labs unremarkable with PCR of 0.16.ASA 81mg  PO order daily.  Fetal wellbeing: Cat 1. EFW 3.7lbs, dx IUGR 8%, vertex on 1/11, BPP 6/8 minus for intermittent breathing, BMZ complete, NICU consult completed 11/19/19. MFM recommend weekly BPP, Qshift NST.  Burman Foster, MSN, CNM 11/21/2019 9:29 AM

## 2019-11-22 ENCOUNTER — Encounter (HOSPITAL_COMMUNITY): Payer: Self-pay | Admitting: Obstetrics and Gynecology

## 2019-11-22 ENCOUNTER — Inpatient Hospital Stay (HOSPITAL_COMMUNITY): Payer: No Typology Code available for payment source | Admitting: Anesthesiology

## 2019-11-22 ENCOUNTER — Encounter (HOSPITAL_COMMUNITY)
Admission: AD | Disposition: A | Discharge status: Home / Self Care | Payer: Self-pay | Source: Home / Self Care | Attending: Obstetrics and Gynecology

## 2019-11-22 LAB — CBC
HCT: 36.3 % (ref 36.0–46.0)
Hemoglobin: 12.1 g/dL (ref 12.0–15.0)
MCH: 27.7 pg (ref 26.0–34.0)
MCHC: 33.3 g/dL (ref 30.0–36.0)
MCV: 83.1 fL (ref 80.0–100.0)
Platelets: 389 10*3/uL (ref 150–400)
RBC: 4.37 MIL/uL (ref 3.87–5.11)
RDW: 14.6 % (ref 11.5–15.5)
WBC: 13 10*3/uL — ABNORMAL HIGH (ref 4.0–10.5)
nRBC: 0 % (ref 0.0–0.2)

## 2019-11-22 LAB — ABO/RH: ABO/RH(D): A POS

## 2019-11-22 LAB — TYPE AND SCREEN
ABO/RH(D): A POS
Antibody Screen: NEGATIVE

## 2019-11-22 LAB — CREATININE, SERUM
Creatinine, Ser: 0.49 mg/dL (ref 0.44–1.00)
GFR calc Af Amer: >60 mL/min (ref >60)
GFR calc non Af Amer: >60 mL/min (ref >60)

## 2019-11-22 SURGERY — Surgical Case
Anesthesia: Spinal

## 2019-11-22 MED ORDER — FERROUS SULFATE 325 (65 FE) MG PO TABS
325.0000 mg | ORAL_TABLET | Freq: Two times a day (BID) | ORAL | Status: DC
  Administered 2019-11-22 – 2019-11-24 (×5): 325 mg via ORAL
  Filled 2019-11-22 (×6): qty 1

## 2019-11-22 MED ORDER — ONDANSETRON HCL 4 MG/2ML IJ SOLN: 4.0000 mg | Freq: Three times a day (TID) | INTRAMUSCULAR | Status: DC | PRN

## 2019-11-22 MED ORDER — ACETAMINOPHEN 500 MG PO TABS
1000.0000 mg | ORAL_TABLET | Freq: Four times a day (QID) | ORAL | Status: AC
  Administered 2019-11-22 – 2019-11-23 (×4): 1000 mg via ORAL
  Filled 2019-11-22 (×4): qty 2

## 2019-11-22 MED ORDER — MEASLES, MUMPS & RUBELLA VAC IJ SOLR: 0.5000 mL | Freq: Once | INTRAMUSCULAR | Status: DC

## 2019-11-22 MED ORDER — OXYTOCIN 40 UNITS IN NORMAL SALINE INFUSION - SIMPLE MED
INTRAVENOUS | Status: DC | PRN
  Administered 2019-11-22: 500 mL via INTRAVENOUS

## 2019-11-22 MED ORDER — ACETAMINOPHEN 500 MG PO TABS
1000.0000 mg | ORAL_TABLET | Freq: Once | ORAL | Status: AC
  Administered 2019-11-22: 12:00:00 1000 mg via ORAL

## 2019-11-22 MED ORDER — WITCH HAZEL-GLYCERIN EX PADS: 1.0000 "application " | MEDICATED_PAD | CUTANEOUS | Status: DC | PRN

## 2019-11-22 MED ORDER — DIPHENHYDRAMINE HCL 25 MG PO CAPS: 25.0000 mg | ORAL_CAPSULE | ORAL | Status: DC | PRN

## 2019-11-22 MED ORDER — FENTANYL CITRATE (PF) 100 MCG/2ML IJ SOLN: 25.0000 ug | INTRAMUSCULAR | Status: DC | PRN

## 2019-11-22 MED ORDER — METHYLERGONOVINE MALEATE 0.2 MG/ML IJ SOLN: 0.2000 mg | INTRAMUSCULAR | Status: DC | PRN

## 2019-11-22 MED ORDER — DIPHENHYDRAMINE HCL 25 MG PO CAPS: 25.0000 mg | ORAL_CAPSULE | Freq: Four times a day (QID) | ORAL | Status: DC | PRN

## 2019-11-22 MED ORDER — NALOXONE HCL 0.4 MG/ML IJ SOLN: 0.4000 mg | INTRAMUSCULAR | Status: DC | PRN

## 2019-11-22 MED ORDER — BUPIVACAINE HCL (PF) 0.25 % IJ SOLN
INTRAMUSCULAR | Status: DC | PRN
  Administered 2019-11-22: 20 mL

## 2019-11-22 MED ORDER — ACETAMINOPHEN 160 MG/5ML PO SOLN: 1000.0000 mg | Freq: Once | ORAL | Status: AC

## 2019-11-22 MED ORDER — KETOROLAC TROMETHAMINE 30 MG/ML IJ SOLN: 30.0000 mg | Freq: Four times a day (QID) | INTRAMUSCULAR | Status: AC | PRN

## 2019-11-22 MED ORDER — COCONUT OIL OIL: 1.0000 "application " | TOPICAL_OIL | Status: DC | PRN

## 2019-11-22 MED ORDER — METHYLERGONOVINE MALEATE 0.2 MG PO TABS
0.2000 mg | ORAL_TABLET | ORAL | Status: DC | PRN
  Filled 2019-11-22: qty 1

## 2019-11-22 MED ORDER — KETOROLAC TROMETHAMINE 30 MG/ML IJ SOLN
INTRAMUSCULAR | Status: AC
  Filled 2019-11-22: qty 1

## 2019-11-22 MED ORDER — SIMETHICONE 80 MG PO CHEW
80.0000 mg | CHEWABLE_TABLET | ORAL | Status: DC
  Administered 2019-11-23 – 2019-11-24 (×3): 80 mg via ORAL
  Filled 2019-11-22 (×3): qty 1

## 2019-11-22 MED ORDER — FENTANYL CITRATE (PF) 100 MCG/2ML IJ SOLN
INTRAMUSCULAR | Status: AC
  Filled 2019-11-22: qty 2

## 2019-11-22 MED ORDER — DIPHENHYDRAMINE HCL 50 MG/ML IJ SOLN: 12.5000 mg | INTRAMUSCULAR | Status: DC | PRN

## 2019-11-22 MED ORDER — ACETAMINOPHEN 500 MG PO TABS
ORAL_TABLET | ORAL | Status: AC
  Filled 2019-11-22: qty 2

## 2019-11-22 MED ORDER — PRENATAL MULTIVITAMIN CH
1.0000 | ORAL_TABLET | Freq: Every day | ORAL | Status: DC
  Administered 2019-11-23 – 2019-11-24 (×2): 1 via ORAL
  Filled 2019-11-22 (×2): qty 1

## 2019-11-22 MED ORDER — SOD CITRATE-CITRIC ACID 500-334 MG/5ML PO SOLN
ORAL | Status: AC
  Administered 2019-11-22: 10:00:00 30 mL
  Filled 2019-11-22: qty 30

## 2019-11-22 MED ORDER — PHENYLEPHRINE HCL-NACL 20-0.9 MG/250ML-% IV SOLN
INTRAVENOUS | Status: DC | PRN
  Administered 2019-11-22: 60 ug/min via INTRAVENOUS

## 2019-11-22 MED ORDER — OXYTOCIN 40 UNITS IN NORMAL SALINE INFUSION - SIMPLE MED: 2.5000 [IU]/h | INTRAVENOUS | Status: AC

## 2019-11-22 MED ORDER — NIFEDIPINE ER OSMOTIC RELEASE 60 MG PO TB24: 60.0000 mg | ORAL_TABLET | Freq: Every day | ORAL | Status: DC

## 2019-11-22 MED ORDER — NALBUPHINE HCL 10 MG/ML IJ SOLN: 5.0000 mg | Freq: Once | INTRAMUSCULAR | Status: DC | PRN

## 2019-11-22 MED ORDER — PHENYLEPHRINE HCL-NACL 20-0.9 MG/250ML-% IV SOLN
INTRAVENOUS | Status: AC
  Filled 2019-11-22: qty 250

## 2019-11-22 MED ORDER — MORPHINE SULFATE (PF) 0.5 MG/ML IJ SOLN
INTRAMUSCULAR | Status: DC | PRN
  Administered 2019-11-22: .15 mg via INTRATHECAL

## 2019-11-22 MED ORDER — KETOROLAC TROMETHAMINE 30 MG/ML IJ SOLN
30.0000 mg | Freq: Once | INTRAMUSCULAR | Status: AC
  Administered 2019-11-22: 12:00:00 30 mg via INTRAVENOUS

## 2019-11-22 MED ORDER — NALBUPHINE HCL 10 MG/ML IJ SOLN: 5.0000 mg | INTRAMUSCULAR | Status: DC | PRN

## 2019-11-22 MED ORDER — IBUPROFEN 600 MG PO TABS
600.0000 mg | ORAL_TABLET | Freq: Four times a day (QID) | ORAL | Status: DC
  Administered 2019-11-22 – 2019-11-25 (×11): 600 mg via ORAL
  Filled 2019-11-22 (×11): qty 1

## 2019-11-22 MED ORDER — FENTANYL CITRATE (PF) 100 MCG/2ML IJ SOLN
INTRAMUSCULAR | Status: DC | PRN
  Administered 2019-11-22: 15 ug via INTRATHECAL

## 2019-11-22 MED ORDER — DEXTROSE 5 % IV SOLN
3.0000 g | Freq: Once | INTRAVENOUS | Status: AC
  Administered 2019-11-22: 3 g via INTRAVENOUS
  Filled 2019-11-22: qty 3000

## 2019-11-22 MED ORDER — ZOLPIDEM TARTRATE 5 MG PO TABS: 5.0000 mg | ORAL_TABLET | Freq: Every evening | ORAL | Status: DC | PRN

## 2019-11-22 MED ORDER — OXYCODONE HCL 5 MG PO TABS
5.0000 mg | ORAL_TABLET | ORAL | Status: DC | PRN
  Administered 2019-11-23 – 2019-11-24 (×3): 5 mg via ORAL
  Filled 2019-11-22 (×3): qty 1

## 2019-11-22 MED ORDER — ONDANSETRON HCL 4 MG/2ML IJ SOLN
INTRAMUSCULAR | Status: AC
  Filled 2019-11-22: qty 2

## 2019-11-22 MED ORDER — ACETAMINOPHEN 500 MG PO TABS: 1000.0000 mg | ORAL_TABLET | Freq: Four times a day (QID) | ORAL | Status: DC

## 2019-11-22 MED ORDER — TETANUS-DIPHTH-ACELL PERTUSSIS 5-2.5-18.5 LF-MCG/0.5 IM SUSP: 0.5000 mL | Freq: Once | INTRAMUSCULAR | Status: DC

## 2019-11-22 MED ORDER — SODIUM CHLORIDE 0.9% FLUSH: 3.0000 mL | INTRAVENOUS | Status: DC | PRN

## 2019-11-22 MED ORDER — BUPIVACAINE IN DEXTROSE 0.75-8.25 % IT SOLN
INTRATHECAL | Status: DC | PRN
  Administered 2019-11-22: 1.5 mL via INTRATHECAL

## 2019-11-22 MED ORDER — ENOXAPARIN SODIUM 60 MG/0.6ML ~~LOC~~ SOLN
0.5000 mg/kg | SUBCUTANEOUS | Status: DC
  Administered 2019-11-23 – 2019-11-25 (×3): 55 mg via SUBCUTANEOUS
  Filled 2019-11-22 (×3): qty 0.6

## 2019-11-22 MED ORDER — SENNOSIDES-DOCUSATE SODIUM 8.6-50 MG PO TABS
2.0000 | ORAL_TABLET | ORAL | Status: DC
  Administered 2019-11-23 (×2): 2 via ORAL
  Filled 2019-11-22 (×2): qty 2

## 2019-11-22 MED ORDER — ONDANSETRON HCL 4 MG/2ML IJ SOLN
INTRAMUSCULAR | Status: DC | PRN
  Administered 2019-11-22: 4 mg via INTRAVENOUS

## 2019-11-22 MED ORDER — NALOXONE HCL 4 MG/10ML IJ SOLN
1.0000 ug/kg/h | INTRAVENOUS | Status: DC | PRN
  Filled 2019-11-22: qty 5

## 2019-11-22 MED ORDER — MORPHINE SULFATE (PF) 0.5 MG/ML IJ SOLN
INTRAMUSCULAR | Status: AC
  Filled 2019-11-22: qty 10

## 2019-11-22 MED ORDER — SIMETHICONE 80 MG PO CHEW: 80.0000 mg | CHEWABLE_TABLET | ORAL | Status: DC | PRN

## 2019-11-22 MED ORDER — MENTHOL 3 MG MT LOZG: 1.0000 | LOZENGE | OROMUCOSAL | Status: DC | PRN

## 2019-11-22 MED ORDER — SIMETHICONE 80 MG PO CHEW
80.0000 mg | CHEWABLE_TABLET | Freq: Three times a day (TID) | ORAL | Status: DC
  Administered 2019-11-22 – 2019-11-23 (×2): 80 mg via ORAL
  Filled 2019-11-22 (×3): qty 1

## 2019-11-22 MED ORDER — PROMETHAZINE HCL 25 MG/ML IJ SOLN: 6.2500 mg | INTRAMUSCULAR | Status: DC | PRN

## 2019-11-22 MED ORDER — FENTANYL CITRATE (PF) 100 MCG/2ML IJ SOLN
50.0000 ug | Freq: Once | INTRAMUSCULAR | Status: AC
  Administered 2019-11-22: 09:00:00 50 ug via INTRAVENOUS
  Filled 2019-11-22: qty 2

## 2019-11-22 MED ORDER — LACTATED RINGERS IV BOLUS
500.0000 mL | Freq: Once | INTRAVENOUS | Status: AC
  Administered 2019-11-22: 03:00:00 500 mL via INTRAVENOUS

## 2019-11-22 MED ORDER — DIBUCAINE (PERIANAL) 1 % EX OINT: 1.0000 "application " | TOPICAL_OINTMENT | CUTANEOUS | Status: DC | PRN

## 2019-11-22 MED ORDER — OXYTOCIN 40 UNITS IN NORMAL SALINE INFUSION - SIMPLE MED
INTRAVENOUS | Status: AC
  Filled 2019-11-22: qty 1000

## 2019-11-22 MED ORDER — LACTATED RINGERS IV BOLUS
500.0000 mL | Freq: Once | INTRAVENOUS | Status: AC
  Administered 2019-11-22: 09:00:00 500 mL via INTRAVENOUS

## 2019-11-22 MED ORDER — BUPIVACAINE HCL (PF) 0.25 % IJ SOLN
INTRAMUSCULAR | Status: AC
  Filled 2019-11-22: qty 20

## 2019-11-22 MED ORDER — FENTANYL CITRATE (PF) 100 MCG/2ML IJ SOLN
100.0000 ug | Freq: Once | INTRAMUSCULAR | Status: AC
  Administered 2019-11-22: 05:00:00 100 ug via INTRAVENOUS
  Filled 2019-11-22: qty 2

## 2019-11-22 SURGICAL SUPPLY — 45 items
BENZOIN TINCTURE PRP APPL 2/3 (GAUZE/BANDAGES/DRESSINGS) ×3 IMPLANT
BOOTIES KNEE HIGH SLOAN (MISCELLANEOUS) ×6 IMPLANT
CHLORAPREP W/TINT 26ML (MISCELLANEOUS) ×3 IMPLANT
CLAMP CORD UMBIL (MISCELLANEOUS) IMPLANT
CLOSURE STERI-STRIP 1/2X4 (GAUZE/BANDAGES/DRESSINGS) ×1
CLOSURE WOUND 1/2 X4 (GAUZE/BANDAGES/DRESSINGS) ×1
CLOTH BEACON ORANGE TIMEOUT ST (SAFETY) ×3 IMPLANT
CLSR STERI-STRIP ANTIMIC 1/2X4 (GAUZE/BANDAGES/DRESSINGS) ×1 IMPLANT
DRAIN JACKSON PRT FLT 10 (DRAIN) IMPLANT
DRSG OPSITE POSTOP 4X10 (GAUZE/BANDAGES/DRESSINGS) ×3 IMPLANT
ELECT REM PT RETURN 9FT ADLT (ELECTROSURGICAL) ×3
ELECTRODE REM PT RTRN 9FT ADLT (ELECTROSURGICAL) ×1 IMPLANT
EVACUATOR SILICONE 100CC (DRAIN) IMPLANT
EXTRACTOR VACUUM M CUP 4 TUBE (SUCTIONS) IMPLANT
EXTRACTOR VACUUM M CUP 4' TUBE (SUCTIONS)
GAUZE SPONGE 4X4 12PLY STRL LF (GAUZE/BANDAGES/DRESSINGS) ×2 IMPLANT
GLOVE BIOGEL PI IND STRL 7.0 (GLOVE) ×2 IMPLANT
GLOVE BIOGEL PI INDICATOR 7.0 (GLOVE) ×4
GLOVE ECLIPSE 6.5 STRL STRAW (GLOVE) ×3 IMPLANT
GOWN STRL REUS W/TWL LRG LVL3 (GOWN DISPOSABLE) ×6 IMPLANT
KIT ABG SYR 3ML LUER SLIP (SYRINGE) ×2 IMPLANT
NDL HYPO 25X5/8 SAFETYGLIDE (NEEDLE) IMPLANT
NEEDLE HYPO 22GX1.5 SAFETY (NEEDLE) ×3 IMPLANT
NEEDLE HYPO 25X5/8 SAFETYGLIDE (NEEDLE) ×3 IMPLANT
NS IRRIG 1000ML POUR BTL (IV SOLUTION) ×6 IMPLANT
PACK C SECTION WH (CUSTOM PROCEDURE TRAY) ×3 IMPLANT
PAD ABD 7.5X8 STRL (GAUZE/BANDAGES/DRESSINGS) ×2 IMPLANT
PAD OB MATERNITY 4.3X12.25 (PERSONAL CARE ITEMS) ×3 IMPLANT
PENCIL SMOKE EVAC W/HOLSTER (ELECTROSURGICAL) ×3 IMPLANT
RETAINER VISCERAL (MISCELLANEOUS) ×2 IMPLANT
RTRCTR C-SECT PINK 25CM LRG (MISCELLANEOUS) ×3 IMPLANT
SPONGE LAP 18X18 RF (DISPOSABLE) ×2 IMPLANT
STRIP CLOSURE SKIN 1/2X4 (GAUZE/BANDAGES/DRESSINGS) ×2 IMPLANT
SUT MNCRL AB 3-0 PS2 27 (SUTURE) ×3 IMPLANT
SUT PLAIN 2 0 XLH (SUTURE) ×2 IMPLANT
SUT SILK 2 0 FSL 18 (SUTURE) IMPLANT
SUT VIC AB 0 CTX 36 (SUTURE) ×8
SUT VIC AB 0 CTX36XBRD ANBCTRL (SUTURE) ×2 IMPLANT
SUT VIC AB 1 CT1 36 (SUTURE) ×6 IMPLANT
SUT VIC AB 2-0 CT1 27 (SUTURE)
SUT VIC AB 2-0 CT1 TAPERPNT 27 (SUTURE) IMPLANT
TAPE CLOTH SURG 4X10 WHT LF (GAUZE/BANDAGES/DRESSINGS) ×2 IMPLANT
TOWEL OR 17X24 6PK STRL BLUE (TOWEL DISPOSABLE) ×3 IMPLANT
TRAY FOLEY W/BAG SLVR 14FR LF (SET/KITS/TRAYS/PACK) ×3 IMPLANT
WATER STERILE IRR 1000ML POUR (IV SOLUTION) ×3 IMPLANT

## 2019-11-22 NOTE — Progress Notes (Signed)
Asleep after IV sedation, no ctx per TOCO, Cat 1 fetal surveillance, EFM off. Will update Dr. Charlesetta Garibaldi and Dr. Cletis Media @ 0700.   Burman Foster, MSN, CNM 11/22/2019 6:17 AM

## 2019-11-22 NOTE — Progress Notes (Addendum)
Subjective:    C/O lower abd cramping and back pain Q 5-6 min w/ pink discharge.   Objective:    VS: BP 128/80 (BP Location: Right Arm)   Pulse 93   Temp 98.4 F (36.9 C) (Oral)   Resp 18   Ht 5' (1.524 m)   Wt 111.7 kg Comment: standing scale  LMP 04/09/2019   SpO2 100%   BMI 48.09 kg/m  FHR : baseline 155 / variability moderate / accelerations present / absent decelerations Toco:none per toco, abd palpates soft Membranes: blood tinged discharge in vaginal canal  Dilation: 2 Effacement (%): 70 Station: 0 Exam by:: Burman Foster CNM  Assessment/Plan:   24 y.o. G1P0 [redacted]w[redacted]d PPROM x4 days w/ oligo and IUGR 8%ile, hx of CHTN S/P Latency antibiotics, currently on orals, BMZ complete, did not receive MgSO4 for neuro protection  Labor: suspect preterm labor, IV sedation now, re-assess if ctx continue Preeclampsia:  no signs or symptoms of toxicity hx of CHTN, stable on Procardia XL 60 daily Fetal Wellbeing:  Category I Pain Control:  IV pain meds I/D:  GBS positive, PCN in active labor    Plan discussed w/ Dr. Jethro Bastos CNM, MSN 11/22/2019 4:36 AM

## 2019-11-22 NOTE — Progress Notes (Addendum)
Subjective: Yani is feeling contractions and is uncomfortable.  FM+.  Up to BR and having bloody show.    Objective: BP 125/88 (BP Location: Right Arm)   Pulse 98   Temp 98.3 F (36.8 C) (Oral)   Resp 19   Ht 5' (1.524 m)   Wt 111.7 kg Comment: standing scale  LMP 04/09/2019   SpO2 98%   BMI 48.09 kg/m  I/O last 3 completed shifts: In: 1800 [P.O.:1800] Out: 1350 [Urine:1350] Total I/O In: -  Out: 400 [Urine:400]  FHT: Category 1  FHT 150 accels noted no decels UC:   regular, every 2 minutes lasting 50 sec SVE:   Dilation: 2.5 Effacement (%): 100 Station: 0 Exam by:: Izora Gala, CNM Transverse presentation  Assessment:  24 y.o. G1P0 [redacted]w[redacted]d PPROM x4 days w/ oligo and IUGR 8%ile, hx of CHTN non vertex presentation S/P Latency antibiotics, currently on orals, BMZ complete,  Cat 1 strip Plan: Discussed labor contractions and position with Dr. Cletis Media.  She will be in to evaluate the patient Fentanyl for comfort  Starla Link CNM, MSN 11/21/2024, 8:28 AM   Bedside ultrasound: oblique complicated presentation with head to maternal left shoulder and hand in the cervix NST reactive Patient afebrile Presentation too low for version: brief failed attempt   Cesarean section reviewed with pt with R&B including but not limited to:  bleeding, infection, injury to other organs. Low transverse approach planned which will allow vaginal delivery with future pregnancies. Should a vertical incision or inverted T be needed, patient is aware that repeat cesarean sections would be recommended in the future. Expected hospital stay and recovery also discussed. Patient is agreeable to proceed  Patient will room in with baby in NICU

## 2019-11-22 NOTE — Op Note (Signed)
Preoperative diagnosis: Intrauterine pregnancy at 32 weeks and 3 days, PPROM, preterm labor, oblique presentation, CHTN, BMI 48  Post operative diagnosis: same with footling breech  Anesthesia: Spinal  Anesthesiologist: Dr. Daiva Huge  Procedure: Primary low transverse cesarean section  Surgeon: Dr. Katharine Look Anthonette Lesage  Assistant: Irene Shipper CNM  Quantitated blood loss: 446 cc  Procedure:  After being informed of the planned procedure and possible complications including bleeding, infection, injury to other organs, informed consent is obtained. The patient is taken to OR #B and given spinal anesthesia without complication. She is placed in the dorsal decubitus position with the pelvis tilted to the left. She is then prepped and draped in a sterile fashion. A Foley catheter is inserted in her bladder.  After assessing adequate level of anesthesia, we infiltrate the suprapubic area with 20 cc of Marcaine 0.25 and perform a Pfannenstiel incision which is brought down sharply to the fascia. The fascia is entered in a low transverse fashion. Linea alba is dissected. Peritoneum is entered in a midline fashion. An Alexis retractor is easily positioned.   The myometrium is then entered in a low transverse fashion, 2 cm above the vesico-uterine junction ; first with knife and then extended bluntly. Amniotic fluid is inexistent. We assist the birth of a female  infant in footling breech presentation. THE TRANSVERSE INCISION WAS EXTENDED VERTICALLY (INVERTED T) FOR 1.5 CM. Mouth and nose are suctioned. The baby is delivered. The cord is clamped and sectioned. The baby is given to the neonatologist present in the room.  10 cc of blood is drawn from the umbilical vein.2 CC OF BLOOD IS DRAWN FROM THE UMBILICAL ARTERY FOR CORD GAS.The placenta is allowed to deliver spontaneously. It is complete and the cord has 3 vessels. Uterine revision is negative.  We proceed with closure of the myometrium in 2 layers: First  with a running locked suture of 0 Vicryl, then with a Lembert suture of 0 Vicryl imbricating the first one including the 1.5 cm extension. Hemostasis is completed with cauterization on peritoneal edges and figure-of-eight sutures of 0-Vicryl.  Both paracolic gutters are cleaned. Both tubes and ovaries are assessed and normal. The pelvis is profusely irrigated with warm saline to confirm a satisfactory hemostasis.  Retractors and sponges are removed. Under fascia hemostasis is completed with cauterization. The fascia is then closed with 2 running sutures of 0 Vicryl meeting midline. The wound is irrigated with warm saline and hemostasis is completed with cauterization.The subcuticular tissue is closed with interrupted sutures of 2-0 Plain.The skin is closed with a subcuticular suture of 3-0 Monocryl and Steri-Strips.  Instrument and sponge count is complete x2. Quantitated blood loss is 446 cc.  The procedure is well tolerated by the patient who is taken to recovery room in a well and stable condition.  female baby named Hetty Blend was born at 10:58 and received an Apgar of 6  at 1 minute and 10 at 5 minutes. Cord gas is pending.  Specimen: Placenta sent to pathology   Dede Query Riverlyn Kizziah MD 1/15/202110:03 AM

## 2019-11-22 NOTE — Anesthesia Postprocedure Evaluation (Signed)
Anesthesia Post Note  Patient: Warehouse manager  Procedure(s) Performed: CESAREAN SECTION (N/A )     Patient location during evaluation: PACU Anesthesia Type: Spinal Level of consciousness: awake and alert and oriented Pain management: pain level controlled Vital Signs Assessment: post-procedure vital signs reviewed and stable Respiratory status: spontaneous breathing, nonlabored ventilation and respiratory function stable Cardiovascular status: blood pressure returned to baseline Postop Assessment: no apparent nausea or vomiting, spinal receding, no headache and no backache Anesthetic complications: no             Brennan Bailey

## 2019-11-22 NOTE — Anesthesia Procedure Notes (Signed)
Spinal  Patient location during procedure: OR Start time: 11/22/2019 10:30 AM End time: 11/22/2019 10:32 AM Staffing Performed: anesthesiologist  Anesthesiologist: Brennan Bailey, MD Preanesthetic Checklist Completed: patient identified, IV checked, risks and benefits discussed, monitors and equipment checked, pre-op evaluation and timeout performed Spinal Block Patient position: sitting Prep: DuraPrep and site prepped and draped Patient monitoring: heart rate, continuous pulse ox and blood pressure Approach: midline Location: L3-4 Injection technique: single-shot Needle Needle type: Pencan  Needle gauge: 24 G Needle length: 10 cm Assessment Sensory level: T4 Additional Notes Risks, benefits, and alternative discussed. Patient gave consent to procedure. Prepped and draped in sitting position. Clear CSF obtained after one needle pass. Positive terminal aspiration. No pain or paraesthesias with injection. Patient tolerated procedure well. Vital signs stable. Tawny Asal, MD

## 2019-11-22 NOTE — Lactation Note (Signed)
This note was copied from a baby's chart. Lactation Consultation Note  Patient Name: Angel Byrd [id number] Date: 11/22/2019 Reason for consult: Initial assessment;Preterm <34wks;Infant < 6lbs;Primapara;1st time breastfeeding;NICU baby  LC in to visit with P1 Mom of [redacted]w[redacted]d baby in the NICU.  Baby 5 hrs old and stable in isolette on room air.    RN set up DEBP and Mom pumped for first time, expressing 30 ml.  Mom has breastmilk labels and placed EBM in refrigerator.  Mom has been leaking for weeks, and milk appears more like transitional milk than colostrum.  Reviewed importance of disassembling pump parts and washing, rinsing, and air drying pump parts after pumping.   Mom encouraged to pump both breasts 15 mins on initiation setting every 2-3 hrs during the day, and 4 hrs at night.   Encouraged holding baby STS when she is able to.    Mom had been instructed on breast massage and hand expression, colostrum containers given for storage.   Mom doesn't have a DEBP at home.  She is under [employer] and has [place].  Faxed request to [facility] for pump at discharge.  Mom would qualify for an employee Medela pump also.    Lactation brochure left in room, and NICU booklet given also.  Mom knows to ask for help prn.   Interventions Interventions: Skin to skin;Breast massage;Hand express;DEBP;Breast feeding basics reviewed  Lactation Tools Discussed/Used Tools: Pump;Flanges Flange Size: 27 Breast pump type: Double-Electric Breast Pump WIC Program: Yes Pump Review: Setup, frequency, and cleaning;Milk Storage Initiated by:: RN Date initiated:: 11/22/19   Consult Status Consult Status: Follow-up Date: 11/23/19 Follow-up type: In-patient    [name] 11/22/2019, 4:10 PM

## 2019-11-22 NOTE — Transfer of Care (Signed)
Immediate Anesthesia Transfer of Care Note  Patient: Angel Byrd  Procedure(s) Performed: CESAREAN SECTION (N/A )  Patient Location: PACU  Anesthesia Type:Spinal  Level of Consciousness: awake, alert  and oriented  Airway & Oxygen Therapy: Patient Spontanous Breathing  Post-op Assessment: Report given to RN and Post -op Vital signs reviewed and stable  Post vital signs: Reviewed and stable  Last Vitals:  Vitals Value Taken Time  BP    Temp    Pulse 81 11/22/19 1213  Resp 21 11/22/19 1213  SpO2 100 % 11/22/19 1213  Vitals shown include unvalidated device data.  Last Pain:  Vitals:   11/22/19 1005  TempSrc: Oral  PainSc:       Patients Stated Pain Goal: 2 ([id number])  Complications: No apparent anesthesia complications

## 2019-11-22 NOTE — Anesthesia Preprocedure Evaluation (Signed)
Anesthesia Evaluation  Patient identified by MRN, date of birth, ID band Patient awake    Reviewed: Allergy & Precautions, NPO status , Patient's Chart, lab work & pertinent test results  History of Anesthesia Complications Negative for: history of anesthetic complications  Airway Mallampati: II  TM Distance: >3 FB Neck ROM: Full    Dental no notable dental hx.    Pulmonary neg pulmonary ROS,    Pulmonary exam normal        Cardiovascular hypertension, Normal cardiovascular exam     Neuro/Psych negative neurological ROS  negative psych ROS   GI/Hepatic negative GI ROS, Neg liver ROS,   Endo/Other  Morbid obesity  Renal/GU negative Renal ROS  negative genitourinary   Musculoskeletal negative musculoskeletal ROS (+)   Abdominal   Peds  Hematology negative hematology ROS (+)   Anesthesia Other Findings Day of surgery medications reviewed with patient.  Reproductive/Obstetrics (+) Pregnancy (PPROM, actively contracting)                             Anesthesia Physical Anesthesia Plan  ASA: III and emergent  Anesthesia Plan: Spinal   Post-op Pain Management:    Induction:   PONV Risk Score and Plan: 4 or greater and Treatment may vary due to age or medical condition, Ondansetron and Dexamethasone  Airway Management Planned: Natural Airway  Additional Equipment: None  Intra-op Plan:   Post-operative Plan:   Informed Consent: I have reviewed the patients History and Physical, chart, labs and discussed the procedure including the risks, benefits and alternatives for the proposed anesthesia with the patient or authorized representative who has indicated his/her understanding and acceptance.       Plan Discussed with: CRNA  Anesthesia Plan Comments:         Anesthesia Quick Evaluation

## 2019-11-22 NOTE — Progress Notes (Signed)
Patient has a tinge of pinkish discharge when she wiped in the bathroom. Patient notified RN. RN advised patient to call if she sees increased bleeding. Pad applied.

## 2019-11-23 LAB — CBC
HCT: 29.3 % — ABNORMAL LOW (ref 36.0–46.0)
Hemoglobin: 9.9 g/dL — ABNORMAL LOW (ref 12.0–15.0)
MCH: 28.3 pg (ref 26.0–34.0)
MCHC: 33.8 g/dL (ref 30.0–36.0)
MCV: 83.7 fL (ref 80.0–100.0)
Platelets: 352 10*3/uL (ref 150–400)
RBC: 3.5 MIL/uL — ABNORMAL LOW (ref 3.87–5.11)
RDW: 14.6 % (ref 11.5–15.5)
WBC: 12.7 10*3/uL — ABNORMAL HIGH (ref 4.0–10.5)
nRBC: 0 % (ref 0.0–0.2)

## 2019-11-23 NOTE — Progress Notes (Addendum)
   Subjective: Angel Byrd is a 24 year old G1P0101  POD #1 She presented on 11/18/19 with PPROM at 31.6 weeks BMZ course and ABX were completed in the antepartum period GBS pos, treated Primary CS without BTL for Oblique lie CHTN, controlled with Procardia 60xl daily BMI 48 Sickle cell trait PP course unremarkable Doing well and ready for a shower Patient reports + flatus, + BM and no problems voiding.    Objective: Vital signs in last 24 hours: Temp:  [98.1 F (36.7 C)-99 F (37.2 C)] 98.3 F (36.8 C) (01/16 0800) Pulse Rate:  [83-97] 92 (01/16 0800) Resp:  [11-22] 18 (01/16 0800) BP: (116-141)/(63-94) 123/67 (01/16 0800) SpO2:  [97 %-100 %] 98 % (01/16 0540)  Physical Exam:  General: alert, cooperative and no distress Lochia: appropriate Uterine Fundus: firm Incision: Dressing, clean, dry, intact DVT Evaluation: No evidence of DVT seen on physical exam.  Recent Labs    11/22/19 0925 11/23/19 0535  HGB 12.1 9.9*  HCT 36.3 29.3*    Assessment/Plan: Status post Cesarean section. Doing well postoperatively.  Continue current couplet care. RN to DC dressing. PT would like to shower Continue to advance diet as tolerated Ambulate within the room   [place] 11/23/2019, 11:45 AM  MD Addendum: I reviewed chart and agree with above findings, assessment and plan as outlined above by [facility].  Dr. Alesia Richards. 11/23/2019.

## 2019-11-23 NOTE — Lactation Note (Signed)
This note was copied from a baby's chart. Lactation Consultation Note  Patient Name: Angel Byrd [id number] Date: 11/23/2019 Reason for consult: Follow-up assessment;Preterm <34wks;Infant < 6lbs;Primapara;1st time breastfeeding;NICU baby  Mom called for [facility] assistance she had questions about her supply. She told LC she's getting discouraged because she's not getting as much as she used to. Explained to mom that is very unusual to get an ounce of EBM on her first pumping session and the amounts that she's getting right now seem closer to average for a first time mom with a premature baby in NICU; praised her for her efforts.  She told LC that she's trying to drink more water and asked for other things she could take. Reviewed galactagogues, pumping schedule and supply/demand. She understands the importance of consistent pumping, but also hand expression and breast massage. When LC revised hand expression with mom, her colostrum was just pouring off her breast, she got very excited to see it "back again". Explained to her that her breasts are not going to react the same way to her warm hands than the plastic on the pump; she voiced understanding.  Feeding plan:  1. Encouraged mom to start pumping every 2-3 hours, ideally 8 pumping sessions/24 hours 2. She'll try breast massage and hand expression prior pumping 3. She'll continue saving her EBM, in the containers provided following milk storage guidelines. Baby is also on donor milk  Mom reported all questions and concerns were answered, she's aware of [place] OP services and will call PRN.   Maternal Data    Feeding    LATCH Score                   Interventions Interventions: Breast feeding basics reviewed;Breast massage;Hand express;Breast compression;DEBP  Lactation Tools Discussed/Used Tools: Pump Breast pump type: Double-Electric Breast Pump   Consult Status Consult Status: PRN Follow-up type:  In-patient    Brelan Hannen Francene Boyers 11/23/2019, 9:51 PM

## 2019-11-23 NOTE — Lactation Note (Signed)
This note was copied from a baby's chart. Lactation Consultation Note  Patient Name: Boy Kash Pezzano [id number] Date: 11/23/2019   Visited with mom of a 72 hours old pre-term NICU female; she started pumping yesterday, but she's not consistently pumping. Explained to her the importance of consistent pumping for the onset of lactogenesis II. She was feeling a little bit discouraged because she's not getting as much as she was getting yesterday, she's only getting about 15-17 ml per pumping session now.  Praised mom for her efforts of providing breast milk for her baby and encouraged her to keep pumping more consistently. Mom's sister is a Adult nurse and she's under her insurance, she works at [employer] and is eligible for an employee pump.  She chose the Medela PIS Max Flow, LC issued the pump today; mom was very grateful. Reviewed benefits of premature milk, pumping schedule and pumping log.   Feeding plan:  1. Encouraged mom to start pumping every 2-3 hours, ideally 8 pumping sessions/24 hours 2. She'll continue saving her EBM, in the containers provided following milk storage guidelines. Baby is also on donor milk  Mom reported all questions and concerns were answered, she's aware of [place] OP services and will call PRN.   Maternal Data    Feeding Feeding Type: Breast Milk  LATCH Score                   Interventions    Lactation Tools Discussed/Used     Consult Status      Tyreque Finken S Antero Derosia 11/23/2019, 1:47 PM

## 2019-11-24 DIAGNOSIS — Z6841 Body Mass Index (BMI) 40.0 and over, adult: Secondary | ICD-10-CM

## 2019-11-24 DIAGNOSIS — O10919 Unspecified pre-existing hypertension complicating pregnancy, unspecified trimester: Secondary | ICD-10-CM | POA: Diagnosis present

## 2019-11-24 DIAGNOSIS — O9902 Anemia complicating childbirth: Secondary | ICD-10-CM | POA: Diagnosis not present

## 2019-11-24 MED ORDER — ACETAMINOPHEN 500 MG PO TABS
1000.0000 mg | ORAL_TABLET | Freq: Four times a day (QID) | ORAL | Status: DC | PRN
  Administered 2019-11-24 (×3): 1000 mg via ORAL
  Filled 2019-11-24 (×3): qty 2

## 2019-11-24 NOTE — Progress Notes (Signed)
Patient ID: Daileen Rines, female   DOB: [birth year], 24 y.o.   MRN: [id number] Subjective: POD# 2 Live born female  Birth Weight: 3 lb 5.6 oz (1520 g) APGAR: 5, 8  She presented on 11/18/19 with PPROM at 31.6 weeks BMZ course and ABX were completed in the antepartum period GBS pos, treated Primary CS without BTL for Oblique lie CHTN, controlled with Procardia 60xl daily BMI 48 Sickle cell trait  Newborn Delivery   Birth date/time: 11/22/2019 10:58:00 Delivery type: C-Section, Low Transverse Trial of labor: No C-section categorization: Primary    Delivering provider: Delsa Bern   Feeding: pumping, no colostrum yet  Pain control at delivery: Spinal   Reports feeling better since shower and APAP, desires to avoid narcotics, does not like the dizzy feeling with it.  Patient reports tolerating PO.   Pain controlled with PO meds Denies HA/SOB/C/P/N/V/dizziness. Flatus present, + BM today. She reports vaginal bleeding as normal, without clots.  She is ambulating, urinating without difficulty.     Objective:   VS:    Vitals:   11/23/19 0800 11/23/19 1551 11/23/19 2212 11/24/19 0612  BP: 123/67 116/75 109/70 128/85  Pulse: 92 90 86 (!) 102  Resp: 18 20 18 18   Temp: 98.3 F (36.8 C) 99.3 F (37.4 C) 99 F (37.2 C) 99.3 F (37.4 C)  TempSrc: Oral  Oral Oral  SpO2:   99% 99%  Weight:      Height:        No intake or output data in the 24 hours ending 11/24/19 0944      Recent Labs    11/22/19 0925 11/23/19 0535  WBC 13.0* 12.7*  HGB 12.1 9.9*  HCT 36.3 29.3*  PLT 389 352     Blood type: --/--/A POS, A POS Performed at [facility], [place] [address] [place], [place] [address]  [phone])  Rubella: 2.23 (06/30 1227)  Vaccines: TDaP UTD         Flu    declined   Physical Exam:  General: alert, cooperative, no distress and morbidly obese CV: Regular rate and rhythm Resp: clear Abdomen: soft, nontender, normal bowel sounds Incision: clean, dry and  intact Uterine Fundus: firm, below umbilicus, nontender Lochia: minimal Ext: no edema, redness or tenderness in the calves or thighs      Assessment/Plan: 24 y.o.   POD# 2. [id number]                  Principal Problem:   Postpartum care following cesarean delivery 1/15 Active Problems:   Preterm premature rupture of membranes (PPROM) with unknown onset of labor   IUGR (intrauterine growth restriction) affecting care of mother   Oligohydramnios   Cesarean delivery - malposition, PTL/PPROM   Maternal anemia, with delivery  - asymptomatic, on oral Fe   HTN in pregnancy, chronic  - BP stable on Procardia 60 XL daily   BMI 45.0-49.9, adult (HCC)  - Lovenox inj for DVT prophylaxis   Doing well, stable.     Breastfeeding support Encourage to ambulate Routine post-op care Anticipate DC in AM Plans Nexplanon at 6 wks PP  Juliene Pina, CNM, MSN 11/24/2019, 9:44 AM

## 2019-11-24 NOTE — Lactation Note (Signed)
This note was copied from a baby's chart. Lactation Consultation Note:  Infant is 32.5 weeks and is 39 hours old. Mother reports that she has pumped once today. She reports that she has been very sleepy and tired.   LC offered assistance and mother agreeable . She sat on the sofa and pumped one breast at a time. She prefers single pumping as she has large breast. Informed mother of hands free bra.  Mother assist with hand expression and milk began spraying into flange.  Mother was given support and encouragement. Discussed importance of frequent , consistent pumping.  Mother informed of available [place] services and community support.   Patient Name: Boy Kyara Bartman [id number] Date: 11/24/2019 Reason for consult: Follow-up assessment   Maternal Data    Feeding Feeding Type: Donor Breast Milk  LATCH Score                   Interventions Interventions: Hand express;DEBP  Lactation Tools Discussed/Used     Consult Status Consult Status: Follow-up Date: 11/25/19 Follow-up type: In-patient    Jess Barters [facility] 11/24/2019, 3:43 PM

## 2019-11-25 LAB — SURGICAL PATHOLOGY

## 2019-11-25 MED ORDER — ACETAMINOPHEN 500 MG PO TABS: 1000.0000 mg | ORAL_TABLET | Freq: Four times a day (QID) | ORAL | 0 refills | Status: DC | PRN

## 2019-11-25 MED ORDER — OXYCODONE HCL 5 MG PO TABS: 5.0000 mg | ORAL_TABLET | Freq: Four times a day (QID) | ORAL | 0 refills | Status: DC | PRN

## 2019-11-25 MED ORDER — IBUPROFEN 600 MG PO TABS: 600.0000 mg | ORAL_TABLET | Freq: Four times a day (QID) | ORAL | 0 refills | Status: DC

## 2019-11-25 MED ORDER — NIFEDIPINE ER 60 MG PO TB24: 60.0000 mg | ORAL_TABLET | Freq: Every day | ORAL | 0 refills | Status: DC

## 2019-11-25 MED FILL — NIFEDIPINE ER OSMOTIC RELEA: 60 | 30 days supply | Qty: 30 | Fill #0

## 2019-11-25 MED FILL — IBUPROFEN 600 MG TABLET: 600 | 7 days supply | Qty: 30 | Fill #0

## 2019-11-25 NOTE — Progress Notes (Signed)
Patient screened out for psychosocial assessment since none of the following apply:  Psychosocial stressors documented in mother or baby's chart  Gestation less than 32 weeks  Code at delivery   Infant with anomalies Please contact the Clinical Social Worker if specific needs arise, by MOB's request, or if MOB scores greater than 9/yes to question 10 on Edinburgh Postpartum Depression Screen.  Asjia Berrios, LCSW Clinical Social Worker [facility] Cell#: [phone]     

## 2019-11-25 NOTE — Discharge Summary (Signed)
OB Discharge Summary     Patient Name: Angel Byrd DOB: 1996/02/03 MRN: [id number]  Date of admission: 11/18/2019 Delivering MD: Delsa Bern   Date of discharge: 11/25/2019  Admitting diagnosis: Preterm premature rupture of membranes (PPROM) with unknown onset of labor [O42.919] Intrauterine pregnancy: 102w3d     Secondary diagnosis:  Principal Problem:   Postpartum care following cesarean delivery 1/15 Active Problems:   Preterm premature rupture of membranes (PPROM) with unknown onset of labor   IUGR (intrauterine growth restriction) affecting care of mother   Oligohydramnios   Cesarean delivery - malposition, PTL/PPROM   Maternal anemia, with delivery   HTN in pregnancy, chronic   BMI 45.0-49.9, adult [facility])  Additional problems:  Patient Active Problem List   Diagnosis Date Noted  . Cesarean delivery - malposition, PTL/PPROM 11/24/2019  . Maternal anemia, with delivery 11/24/2019  . Postpartum care following cesarean delivery 1/15 11/24/2019  . HTN in pregnancy, chronic 11/24/2019  . BMI 45.0-49.9, adult ([place]) 11/24/2019  . IUGR (intrauterine growth restriction) affecting care of mother 11/19/2019  . Oligohydramnios 11/19/2019  . Preterm premature rupture of membranes (PPROM) with unknown onset of labor 11/18/2019  . Essential hypertension, benign 11/13/2018  . Iron deficiency anemia 11/13/2018        Discharge diagnosis: Preterm Pregnancy Delivered, CHTN and Anemia                                                                                                Post partum procedures:none  Augmentation: none  Complications: None  Hospital course:  PPROM @ 31+6 wks, unshceduled C/S for malpresentation   24 y.o. yo G1P0101 at [redacted]w[redacted]d was admitted to the hospital 11/18/2019 for PPROM with the following indication:Malpresentation.  Membrane Rupture Time/Date: 9:00 AM ,11/18/2019   Patient delivered a Viable infant.11/22/2019  Details of operation can be found in  separate operative note.  Pateint had an uncomplicated postpartum course.  She is ambulating, tolerating a regular diet, passing flatus, and urinating well. Patient is discharged home in stable condition on  11/25/19         Physical exam  Vitals:   11/24/19 0612 11/24/19 1500 11/24/19 2152 11/25/19 0500  BP: 128/85 126/70 113/67 (!) 121/92  Pulse: (!) 102 (!) 106 89 86  Resp: 18 20 18 18   Temp: 99.3 F (37.4 C) 98.5 F (36.9 C) 98.1 F (36.7 C) 98.2 F (36.8 C)  TempSrc: Oral  Oral Oral  SpO2: 99%  99% 100%  Weight:      Height:       General: alert, cooperative and no distress Lochia: appropriate Uterine Fundus: firm Incision: Dressing is clean, dry, and intact, Honeycomb dressing replaced 11/23/19 DVT Evaluation: No evidence of DVT seen on physical exam. No cords or calf tenderness. No significant calf/ankle edema. Labs: Lab Results  Component Value Date   WBC 12.7 (H) 11/23/2019   HGB 9.9 (L) 11/23/2019   HCT 29.3 (L) 11/23/2019   MCV 83.7 11/23/2019   PLT 352 11/23/2019   CMP Latest Ref Rng & Units 11/22/2019  Glucose 70 - 99 mg/dL -  BUN  6 - 20 mg/dL -  Creatinine 0.44 - 1.00 mg/dL 0.49  Sodium 135 - 145 mmol/L -  Potassium 3.5 - 5.1 mmol/L -  Chloride 98 - 111 mmol/L -  CO2 22 - 32 mmol/L -  Calcium 8.9 - 10.3 mg/dL -  Total Protein 6.5 - 8.1 g/dL -  Total Bilirubin 0.3 - 1.2 mg/dL -  Alkaline Phos 38 - 126 U/L -  AST 15 - 41 U/L -  ALT 0 - 44 U/L -    Discharge instruction: per After Visit Summary and "Baby and Me Booklet".  After visit meds:  Allergies as of 11/25/2019   No Known Allergies     Medication List    STOP taking these medications   FUSION PO   prenatal multivitamin Tabs tablet     TAKE these medications   acetaminophen 500 MG tablet Commonly known as: TYLENOL Take 2 tablets (1,000 mg total) by mouth every 6 (six) hours as needed for moderate pain.   ibuprofen 600 MG tablet Commonly known as: ADVIL Take 1 tablet (600 mg total)  by mouth every 6 (six) hours.   NIFEdipine 60 MG 24 hr tablet Commonly known as: ADALAT CC Take 1 tablet (60 mg total) by mouth daily.   oxyCODONE 5 MG immediate release tablet Commonly known as: Oxy IR/ROXICODONE Take 1 tablet (5 mg total) by mouth every 6 (six) hours as needed for severe pain.       Diet: low salt diet  Activity: Advance as tolerated. Pelvic rest for 6 weeks.   Outpatient follow [id number] to [facility] in 1 week for BP check and then in 6 weeks for regular postpartum visit. Follow up Appt: Future Appointments  Date Time Provider [place]  11/29/2019  8:15 AM [facility] None  11/29/2019  8:45 AM [place], Holli Humbles, NP [facility] None  04/08/2020  9:00 AM [place] Chard, MD [facility] None   Follow up Visit:No follow-ups on file.  Postpartum contraception: Abstinence  Newborn Data: Live born female  Birth Weight: 3 lb 5.6 oz (1520 g) APGAR: 5, 8  Newborn Delivery   Birth date/time: 11/22/2019 10:58:00 Delivery type: C-Section, Low Transverse Trial of labor: No C-section categorization: Primary      Baby Feeding: Breast and pumping Disposition:NICU, infant may be transferred to [facility] NICU census and pt lives closer to [facility].    11/25/2019 Arrie Eastern, CNM

## 2019-11-25 NOTE — Lactation Note (Signed)
This note was copied from a baby's chart. Lactation Consultation Note  Patient Name: Angel Byrd [id number] Date: 11/25/2019 Reason for consult: Follow-up assessment;Preterm <34wks;Infant < 6lbs;Primapara;1st time breastfeeding;NICU baby  P1 mother whose infant is now 36 hours old.  This is a preterm baby at 32+3 weeks with a CGA of 32+6 weeks, weighing <6 lbs and in the NICU.  Mother was pumping when I arrived.  She was pumping one breast at a time and stated she prefers to pump this way until she feels more comfortable pumping.  Mother had no questions/concerns related to pumping at this time.  Mother is obtaining EBM and is happy to see this since her volume diminished greatly yesterday afternoon.  Encouraged to include hand expression before/after pumping to help increase milk supply.  Mother verbalized understanding.  She has bottles and labels for use.  Mother has obtained a [employer] employee pump from the previous [place].  Suggested she call for any further questions/concerns.   Maternal Data    Feeding Feeding Type: Donor Breast Milk  LATCH Score                   Interventions    Lactation Tools Discussed/Used     Consult Status Consult Status: Follow-up Date: 11/26/19 Follow-up type: In-patient    [name] R [name] 11/25/2019, 9:53 AM

## 2019-11-27 ENCOUNTER — Ambulatory Visit: Payer: Self-pay

## 2019-11-27 NOTE — Lactation Note (Incomplete)
This note was copied from a baby's chart. Lactation Consultation Note: Mother    Patient Name: Angel Byrd [id number] Date: 11/27/2019     Maternal Data    Feeding Feeding Type: Donor Breast Milk  LATCH Score                   Interventions    Lactation Tools Discussed/Used     Consult Status      [name] 11/27/2019, 1:59 PM

## 2019-11-29 ENCOUNTER — Ambulatory Visit: Payer: No Typology Code available for payment source | Admitting: Family

## 2019-11-29 ENCOUNTER — Inpatient Hospital Stay: Payer: No Typology Code available for payment source | Attending: Family

## 2019-12-08 ENCOUNTER — Ambulatory Visit: Payer: Self-pay

## 2019-12-08 NOTE — Lactation Note (Signed)
This note was copied from a baby's chart. Lactation Consultation Note  Patient Name: Angel Byrd [id number] Date: 12/08/2019 Reason for consult: Follow-up assessment;Mother's request P1, 2 week  32 3/7 week preterm infant. Per RN, infant has recent started going to breast licking, tasting and briefly latching three times yesterday for 5 minutes  mom was fitted with 24 mm NS. RN unsure if NS was correct fit mom has large breast,  LC refitted mom with size 20 mm NS, mom latched infant on left breast using the football hold position, and infant latched with few suckles for 2 minutes and stopped. LC informed mom this is normal behavior for preterm infant. Mom will continue working with RN on latching infant at breast and will call [place] services if needed. Per mom, she has been using her DEBP 5 times per day and is pumping 4 ounces of EBM per session. LC advised mom to pump 8 times per day and to hand express after pumping to maintain milk supply, once infant is latching well then she can limit pumping to maybe 5 times per day. Mom was agreeable with suggestion.   Maternal Data    Feeding Feeding Type: Breast Fed Nipple Type: Other(Nfant no flow)  LATCH Score Latch: Repeated attempts needed to sustain latch, nipple held in mouth throughout feeding, stimulation needed to elicit sucking reflex.  Audible Swallowing: None  Type of Nipple: Everted at rest and after stimulation  Comfort (Breast/Nipple): Soft / non-tender  Hold (Positioning): Assistance needed to correctly position infant at breast and maintain latch.  LATCH Score: 6  Interventions Interventions: Assisted with latch;Adjust position;Support pillows;Position options  Lactation Tools Discussed/Used Tools: Nipple Shields Nipple shield size: 20   Consult Status Consult Status: PRN Follow-up type: In-patient    Vicente Serene 12/08/2019, 8:15 PM

## 2019-12-16 ENCOUNTER — Ambulatory Visit: Payer: Self-pay

## 2019-12-16 NOTE — Lactation Note (Signed)
This note was copied from a baby's chart. Lactation Consultation Note  Patient Name: Angel Byrd [id number] Date: 12/16/2019  Randel Books is 34 weeks old in the NICU/35.6 PMA.  Mom reports that she is pumping 6 times/24 hours but her supply is decreasing.  She pumps 3-5 ounces.  Mom is using a Medela pump at home.  She feels she is drinking enough water but plans on increasing intake.  Recommended increasing pumping to 8 times in 24 hours when possible.  Praise given for efforts.  Instructed to call for concerns prn.   Maternal Data    Feeding Feeding Type: Breast Milk Nipple Type: Dr. My[facility] Gian[facility]  LATCH Score                   Interventions    Lactation Tools Discussed/Used     Consult Status      Ave Filter 12/16/2019, 11:26 AM

## 2020-01-02 ENCOUNTER — Ambulatory Visit: Payer: No Typology Code available for payment source | Admitting: Internal Medicine

## 2020-01-29 ENCOUNTER — Encounter: Payer: Self-pay | Admitting: Internal Medicine

## 2020-01-29 ENCOUNTER — Ambulatory Visit (INDEPENDENT_AMBULATORY_CARE_PROVIDER_SITE_OTHER): Payer: BC Managed Care – PPO | Admitting: Internal Medicine

## 2020-01-29 ENCOUNTER — Other Ambulatory Visit: Payer: Self-pay

## 2020-01-29 ENCOUNTER — Telehealth: Payer: Self-pay

## 2020-01-29 VITALS — BP 162/108 | HR 103 | Temp 98.4°F | Ht 60.0 in | Wt 232.6 lb

## 2020-01-29 DIAGNOSIS — I10 Essential (primary) hypertension: Secondary | ICD-10-CM | POA: Diagnosis not present

## 2020-01-29 DIAGNOSIS — Z6841 Body Mass Index (BMI) 40.0 and over, adult: Secondary | ICD-10-CM | POA: Diagnosis not present

## 2020-01-29 DIAGNOSIS — D509 Iron deficiency anemia, unspecified: Secondary | ICD-10-CM | POA: Diagnosis not present

## 2020-01-29 MED ORDER — QSYMIA 7.5-46 MG PO CP24: 7.5000 mg | ORAL_CAPSULE | Freq: Every morning | ORAL | 1 refills | Status: DC

## 2020-01-29 MED ORDER — NEBIVOLOL HCL 10 MG PO TABS: 10.0000 mg | ORAL_TABLET | Freq: Every day | ORAL | 1 refills | Status: DC

## 2020-01-29 MED FILL — QSYMIA 7.5 MG-46 MG CAPSULE: 7.5-46 | 30 days supply | Qty: 30 | Fill #0

## 2020-01-29 NOTE — Telephone Encounter (Signed)
Prior auth completed for the pt's Qsymia 75-46 mg medication, waiting on a response from the pt's insurance

## 2020-01-29 NOTE — Patient Instructions (Signed)

## 2020-01-30 ENCOUNTER — Ambulatory Visit: Payer: No Typology Code available for payment source | Admitting: Internal Medicine

## 2020-02-01 NOTE — Progress Notes (Signed)
This visit occurred during the SARS-CoV-2 public health emergency.  Safety protocols were in place, including screening questions prior to the visit, additional usage of staff PPE, and extensive cleaning of exam room while observing appropriate contact time as indicated for disinfecting solutions.  Subjective:     Patient ID: Angel Byrd , female    DOB: 21-Jul-1996 , 24 y.o.   MRN: [id number]   Chief Complaint  Patient presents with  . Hypertension    HPI  She is here today for f/u HTN. She reports that she does not feel well on nifedipine. This was prescribed during her pregnancy. She delivered a healthy baby boy in Jan 2021.  She would like to go back to what she was taking previously, Bystolic. She is not breastfeeding at this time. She is also interested in resuming Qsymia to help her with weight loss.   Hypertension This is a chronic problem. The current episode started more than 1 year ago. The problem has been gradually improving since onset. The problem is uncontrolled. Pertinent negatives include no blurred vision, chest pain, palpitations or shortness of breath. Risk factors for coronary artery disease include obesity and sedentary lifestyle. Past treatments include calcium channel blockers and beta blockers. The current treatment provides moderate improvement. Compliance problems include exercise.      Past Medical History:  Diagnosis Date  . HTN (hypertension)   . Iron deficiency anemia      Family History  Problem Relation Age of Onset  . Sickle cell anemia Mother   . Heart disease Father   . Hypertension Sister      Current Outpatient Medications:  .  nebivolol (BYSTOLIC) 10 MG tablet, Take 1 tablet (10 mg total) by mouth daily., Disp: 30 tablet, Rfl: 1 .  NIFEdipine (ADALAT CC) 60 MG 24 hr tablet, Take 1 tablet (60 mg total) by mouth daily. (Patient not taking: Reported on 01/29/2020), Disp: 30 tablet, Rfl: 0 .  Phentermine-Topiramate (QSYMIA) 7.5-46 MG CP24,  Take 7.5 mg by mouth every morning., Disp: 30 capsule, Rfl: 1   No Known Allergies   Review of Systems  Constitutional: Negative.   Eyes: Negative for blurred vision.  Respiratory: Negative.  Negative for shortness of breath.   Cardiovascular: Negative.  Negative for chest pain and palpitations.  Gastrointestinal: Negative.   Neurological: Negative.   Psychiatric/Behavioral: Negative.      Today's Vitals   01/29/20 1435  BP: (!) 162/108  Pulse: (!) 103  Temp: 98.4 F (36.9 C)  TempSrc: Oral  Weight: 232 lb 9.6 oz (105.5 kg)  Height: 5' (1.524 m)   Body mass index is 45.43 kg/m.   Objective:  Physical Exam Vitals and nursing note reviewed.  Constitutional:      Appearance: Normal appearance. She is obese.  HENT:     Head: Normocephalic and atraumatic.  Cardiovascular:     Rate and Rhythm: Normal rate and regular rhythm.     Heart sounds: Normal heart sounds.  Pulmonary:     Effort: Pulmonary effort is normal.     Breath sounds: Normal breath sounds.  Skin:    General: Skin is warm.  Neurological:     General: No focal deficit present.     Mental Status: She is alert.  Psychiatric:        Mood and Affect: Mood normal.        Behavior: Behavior normal.         Assessment And Plan:     1. Essential hypertension,  benign  Chronic, uncontrolled. I will resume Bystolic, 10mg  once daily. She will rto in 2 weeks for a nurse visit.   2. Iron deficiency anemia, unspecified iron deficiency anemia type  Chronic. Previous CBC reviewed. I will recheck this at her next visit.   3. Class 3 severe obesity due to excess calories with serious comorbidity and body mass index (BMI) of 45.0 to 49.9 in adult (HCC)  BMI 45. Pt advised that I am unable to start Qsymia at this time b/c of elevated blood pressure. I will consider resumption of medication after her next visit. She is encouraged to start walking, at least 30 minutes five days per week.   4. Postpartum  state   Angel [place], MD    THE PATIENT IS ENCOURAGED TO PRACTICE SOCIAL DISTANCING DUE TO THE COVID-19 PANDEMIC.

## 2020-02-05 ENCOUNTER — Telehealth: Payer: Self-pay

## 2020-02-05 NOTE — Telephone Encounter (Signed)
Called patient advised her of approval of Qsymia 7.5mg -46mg  from 01/30/2020-04/30/2020

## 2020-02-18 ENCOUNTER — Ambulatory Visit: Payer: BC Managed Care – PPO

## 2020-02-18 ENCOUNTER — Other Ambulatory Visit: Payer: Self-pay

## 2020-02-18 VITALS — BP 136/80 | HR 78 | Ht 60.0 in | Wt 231.0 lb

## 2020-02-18 DIAGNOSIS — I10 Essential (primary) hypertension: Secondary | ICD-10-CM

## 2020-03-07 ENCOUNTER — Ambulatory Visit: Payer: No Typology Code available for payment source

## 2020-03-12 ENCOUNTER — Ambulatory Visit (INDEPENDENT_AMBULATORY_CARE_PROVIDER_SITE_OTHER): Payer: BC Managed Care – PPO | Admitting: Internal Medicine

## 2020-03-12 ENCOUNTER — Other Ambulatory Visit: Payer: Self-pay

## 2020-03-12 ENCOUNTER — Encounter: Payer: Self-pay | Admitting: Internal Medicine

## 2020-03-12 VITALS — BP 126/94 | HR 79 | Temp 99.0°F | Ht 61.25 in | Wt 229.6 lb

## 2020-03-12 DIAGNOSIS — Z6841 Body Mass Index (BMI) 40.0 and over, adult: Secondary | ICD-10-CM

## 2020-03-12 DIAGNOSIS — D509 Iron deficiency anemia, unspecified: Secondary | ICD-10-CM

## 2020-03-12 DIAGNOSIS — I10 Essential (primary) hypertension: Secondary | ICD-10-CM | POA: Diagnosis not present

## 2020-03-12 DIAGNOSIS — D573 Sickle-cell trait: Secondary | ICD-10-CM | POA: Diagnosis not present

## 2020-03-14 MED ORDER — SAXENDA 18 MG/3ML ~~LOC~~ SOPN: 3.0000 mg | PEN_INJECTOR | Freq: Every morning | SUBCUTANEOUS | 1 refills | Status: DC

## 2020-03-14 NOTE — Progress Notes (Signed)
This visit occurred during the SARS-CoV-2 public health emergency.  Safety protocols were in place, including screening questions prior to the visit, additional usage of staff PPE, and extensive cleaning of exam room while observing appropriate contact time as indicated for disinfecting solutions.  Subjective:     Patient ID: Angel Byrd , female    DOB: Dec 23, 1995 , 24 y.o.   MRN: [id number]   Chief Complaint  Patient presents with  . Hypertension    HPI  She presents today for BP check. She feels well. Reports compliance with Bystolic. Adjusting to motherhood. Will be going back to work soon.   Hypertension This is a chronic problem. The current episode started more than 1 year ago. The problem has been gradually improving since onset. The problem is controlled. Pertinent negatives include no blurred vision, chest pain, palpitations or shortness of breath. Risk factors for coronary artery disease include obesity and sedentary lifestyle. Past treatments include beta blockers.     Past Medical History:  Diagnosis Date  . HTN (hypertension)   . Iron deficiency anemia      Family History  Problem Relation Age of Onset  . Sickle cell anemia Mother   . Heart disease Father   . Hypertension Sister      Current Outpatient Medications:  .  nebivolol (BYSTOLIC) 10 MG tablet, Take 1 tablet (10 mg total) by mouth daily., Disp: 30 tablet, Rfl: 1 .  NIFEdipine (ADALAT CC) 60 MG 24 hr tablet, Take 1 tablet (60 mg total) by mouth daily. (Patient not taking: Reported on 01/29/2020), Disp: 30 tablet, Rfl: 0 .  Phentermine-Topiramate (QSYMIA) 7.5-46 MG CP24, Take 7.5 mg by mouth every morning. (Patient not taking: Reported on 03/12/2020), Disp: 30 capsule, Rfl: 1   No Known Allergies   Review of Systems  Constitutional: Negative.   Eyes: Negative for blurred vision.  Respiratory: Negative.  Negative for shortness of breath.   Cardiovascular: Negative.  Negative for chest pain and  palpitations.  Gastrointestinal: Negative.   Neurological: Negative.   Psychiatric/Behavioral: Negative.      Today's Vitals   03/12/20 1406  BP: (!) 126/94  Pulse: 79  Temp: 99 F (37.2 C)  TempSrc: Oral  Weight: 229 lb 9.6 oz (104.1 kg)  Height: 5' 1.25" (1.556 m)   Body mass index is 43.03 kg/m.   Objective:  Physical Exam Vitals and nursing note reviewed.  Constitutional:      Appearance: Normal appearance. She is obese.  HENT:     Head: Normocephalic and atraumatic.  Cardiovascular:     Rate and Rhythm: Normal rate and regular rhythm.     Heart sounds: Normal heart sounds.  Pulmonary:     Effort: Pulmonary effort is normal.     Breath sounds: Normal breath sounds.  Skin:    General: Skin is warm.  Neurological:     General: No focal deficit present.     Mental Status: She is alert.  Psychiatric:        Mood and Affect: Mood normal.        Behavior: Behavior normal.         Assessment And Plan:     1. Essential hypertension, benign  Chronic, improved control. Not yet at goal. She will continue with current meds for now. I will check renal function today.   - CBC no Diff - CMP14+EGFR - TSH  2. Sickle cell trait (HCC)  Chronic, yet stable. Encouraged to stay well hydrated.   3. Iron  deficiency anemia, unspecified iron deficiency anemia type  Chronic. I will check CBC today, add on iron levels as needed.   4. Class 3 severe obesity due to excess calories without serious comorbidity with body mass index (BMI) of 40.0 to 44.9 in adult (HCC)  BMI 43.  Unfortunately, her insurance does not cover Qsymia.  She is willing to try another medication. She is willing to try injectable meds.  We discussed the use of Saxenda for weight loss. She is not breastfeeding. SHE DENIES FAMILY HISTORY OF THYROID CANCER. SHE WAS INSTRUCTED ON HOW TO SELF ADMINISTER THE MEDICATION. SHE WILL START WITH 0.6MG DAILY AND INCREASE HER DOSE BY 5 CLICKS ONCE WEEKLY. SHE WAS REMINDED  OF POSSIBLE SIDE EFFECTS INCLUDING NAUSEA, HEADACHES AND DIZZINESS.  SHE WAS ADVISED TO STOP EATING WHEN SHE BEGINS TO FEEL FULL.  SHE WILL RTO IN 4-6 WEEKS FOR RE-EVALUATION.   [name], MD    THE PATIENT IS ENCOURAGED TO PRACTICE SOCIAL DISTANCING DUE TO THE COVID-19 PANDEMIC.

## 2020-04-08 ENCOUNTER — Encounter: Payer: Self-pay | Admitting: Internal Medicine

## 2020-04-10 ENCOUNTER — Ambulatory Visit: Payer: Self-pay

## 2020-04-16 DIAGNOSIS — Z309 Encounter for contraceptive management, unspecified: Secondary | ICD-10-CM | POA: Diagnosis not present

## 2020-04-20 MED FILL — LO LOESTRIN FE 1-10 TABLET: 1 MG-10 MCG | 28 days supply | Qty: 28 | Fill #0

## 2020-04-30 ENCOUNTER — Ambulatory Visit (INDEPENDENT_AMBULATORY_CARE_PROVIDER_SITE_OTHER): Payer: BC Managed Care – PPO | Admitting: Internal Medicine

## 2020-04-30 ENCOUNTER — Other Ambulatory Visit: Payer: Self-pay

## 2020-04-30 ENCOUNTER — Encounter: Payer: Self-pay | Admitting: Internal Medicine

## 2020-04-30 VITALS — BP 140/86 | HR 84 | Temp 98.4°F | Ht 61.2 in | Wt 233.8 lb

## 2020-04-30 DIAGNOSIS — I10 Essential (primary) hypertension: Secondary | ICD-10-CM

## 2020-04-30 DIAGNOSIS — F4329 Adjustment disorder with other symptoms: Secondary | ICD-10-CM

## 2020-04-30 NOTE — Patient Instructions (Addendum)
Stress, Adult Stress is a normal reaction to life events. Stress is what you feel when life demands more than you are used to, or more than you think you can handle. Some stress can be useful, such as studying for a test or meeting a deadline at work. Stress that occurs too often or for too long can cause problems. It can affect your emotional health and interfere with relationships and normal daily activities. Too much stress can weaken your body's defense system (immune system) and increase your risk for physical illness. If you already have a medical problem, stress can make it worse. What are the causes? All sorts of life events can cause stress. An event that causes stress for one person may not be stressful for another person. Major life events, whether positive or negative, commonly cause stress. Examples include:  Losing a job or starting a new job.  Losing a loved one.  Moving to a new town or home.  Getting married or divorced.  Having a baby.  Getting injured or sick. Less obvious life events can also cause stress, especially if they occur day after day or in combination with each other. Examples include:  Working long hours.  Driving in traffic.  Caring for children.  Being in debt.  Being in a difficult relationship. What are the signs or symptoms? Stress can cause emotional symptoms, including:  Anxiety. This is feeling worried, afraid, on edge, overwhelmed, or out of control.  Anger, including irritation or impatience.  Depression. This is feeling sad, down, helpless, or guilty.  Trouble focusing, remembering, or making decisions. Stress can cause physical symptoms, including:  Aches and pains. These may affect your head, neck, back, stomach, or other areas of your body.  Tight muscles or a clenched jaw.  Low energy.  Trouble sleeping. Stress can cause unhealthy behaviors, including:  Eating to feel better (overeating) or skipping meals.  Working too  much or putting off tasks.  Smoking, drinking alcohol, or using drugs to feel better. How is this diagnosed? Stress is diagnosed through an assessment by your health care provider. He or she may diagnose this condition based on:  Your symptoms and any stressful life events.  Your medical history.  Tests to rule out other causes of your symptoms. Depending on your condition, your health care provider may refer you to a specialist for further evaluation. How is this treated?  Stress management techniques are the recommended treatment for stress. Medicine is not typically recommended for the treatment of stress. Techniques to reduce your reaction to stressful life events include:  Stress identification. Monitor yourself for symptoms of stress and identify what causes stress for you. These skills may help you to avoid or prepare for stressful events.  Time management. Set your priorities, keep a calendar of events, and learn to say no. Taking these actions can help you avoid making too many commitments. Techniques for coping with stress include:  Rethinking the problem. Try to think realistically about stressful events rather than ignoring them or overreacting. Try to find the positives in a stressful situation rather than focusing on the negatives.  Exercise. Physical exercise can release both physical and emotional tension. The key is to find a form of exercise that you enjoy and do it regularly.  Relaxation techniques. These relax the body and mind. The key is to find one or more that you enjoy and use the techniques regularly. Examples include: ? Meditation, deep breathing, or progressive relaxation techniques. ? Yoga or   tai chi. ? Biofeedback, mindfulness techniques, or journaling. ? Listening to music, being out in nature, or participating in other hobbies.  Practicing a healthy lifestyle. Eat a balanced diet, drink plenty of water, limit or avoid caffeine, and get plenty of  sleep.  Having a strong support network. Spend time with family, friends, or other people you enjoy being around. Express your feelings and talk things over with someone you trust. Counseling or talk therapy with a mental health professional may be helpful if you are having trouble managing stress on your own. Follow these instructions at home: Lifestyle   Avoid drugs.  Do not use any products that contain nicotine or tobacco, such as cigarettes, e-cigarettes, and chewing tobacco. If you need help quitting, ask your health care provider.  Limit alcohol intake to no more than 1 drink a day for nonpregnant women and 2 drinks a day for men. One drink equals 12 oz of beer, 5 oz of wine, or 1 oz of hard liquor  Do not use alcohol or drugs to relax.  Eat a balanced diet that includes fresh fruits and vegetables, whole grains, lean meats, fish, eggs, and beans, and low-fat dairy. Avoid processed foods and foods high in added fat, sugar, and salt.  Exercise at least 30 minutes on 5 or more days each week.  Get 7-8 hours of sleep each night. General instructions   Practice stress management techniques as discussed with your health care provider.  Drink enough fluid to keep your urine clear or pale yellow.  Take over-the-counter and prescription medicines only as told by your health care provider.  Keep all follow-up visits as told by your health care provider. This is important. Contact a health care provider if:  Your symptoms get worse.  You have new symptoms.  You feel overwhelmed by your problems and can no longer manage them on your own. Get help right away if:  You have thoughts of hurting yourself or others. If you ever feel like you may hurt yourself or others, or have thoughts about taking your own life, get help right away. You can go to your nearest emergency department or call:  Your local emergency services (911 in the [place]  A suicide crisis helpline, such as the  National Suicide Prevention Lifeline at 1-800-273-8255. This is open 24 hours a day. Summary  Stress is a normal reaction to life events. It can cause problems if it happens too often or for too long.  Practicing stress management techniques is the best way to treat stress.  Counseling or talk therapy with a mental health professional may be helpful if you are having trouble managing stress on your own. This information is not intended to replace advice given to you by your health care provider. Make sure you discuss any questions you have with your health care provider. Document Revised: 05/24/2019 Document Reviewed: 12/14/2016 Elsevier Patient Education  2020 Elsevier Inc.  

## 2020-05-01 ENCOUNTER — Ambulatory Visit (INDEPENDENT_AMBULATORY_CARE_PROVIDER_SITE_OTHER): Payer: BC Managed Care – PPO

## 2020-05-01 ENCOUNTER — Ambulatory Visit: Payer: Self-pay

## 2020-05-01 DIAGNOSIS — Z23 Encounter for immunization: Secondary | ICD-10-CM | POA: Diagnosis not present

## 2020-05-01 NOTE — Progress Notes (Signed)
   Covid-19 Vaccination Clinic  Name:  Angel Byrd    MRN: [id number] DOB: [birth year]  05/01/2020  [name] was observed post Covid-19 immunization for 15 minutes without incident. She was provided with Vaccine Information Sheet and instruction to access the V-Safe system.   [name] was instructed to call 911 with any severe reactions post vaccine: Marland Kitchen Difficulty breathing  . Swelling of face and throat  . A fast heartbeat  . A bad rash all over body  . Dizziness and weakness   Immunizations Administered    Name Date Dose VIS Date Route   Moderna COVID-19 Vaccine 05/01/2020 11:45 AM 0.5 mL 10/2019 Intramuscular   Manufacturer: Moderna   Lot: [id number]   [place]: 90228-406-98

## 2020-05-03 NOTE — Progress Notes (Signed)
°  This visit occurred during the SARS-CoV-2 public health emergency.  Safety protocols were in place, including screening questions prior to the visit, additional usage of staff PPE, and extensive cleaning of exam room while observing appropriate contact time as indicated for disinfecting solutions.  Subjective:     Patient ID: Angel Byrd , female    DOB: [birth year] , 24 y.o.   MRN: [id number]   Chief Complaint  Patient presents with   Stress    HPI  She is here today to discuss stress. She is a new mother and she works from home. She does not want to place her child in daycare yet. Unfortunately, a good friend agreed to keep her child this summer; however, it seems she has rescinded her offer. This has caused a lot of stress, b/c she is now working while caring for her son. Additionally, she is currently living with her sister who has 3 kids. Needless to say, there is not enough space.     Past Medical History:  Diagnosis Date   HTN (hypertension)    Iron deficiency anemia      Family History  Problem Relation Age of Onset   Sickle cell anemia Mother    Heart disease Father    Hypertension Sister      Current Outpatient Medications:    nebivolol (BYSTOLIC) 10 MG tablet, Take 1 tablet (10 mg total) by mouth daily., Disp: 30 tablet, Rfl: 1   No Known Allergies   Review of Systems  Constitutional: Negative.   Respiratory: Negative.   Cardiovascular: Negative.   Gastrointestinal: Negative.   Neurological: Negative.   Psychiatric/Behavioral: Negative.      Today's Vitals   04/30/20 1025  BP: 140/86  Pulse: 84  Temp: 98.4 F (36.9 C)  TempSrc: Oral  Weight: 233 lb 12.8 oz (106.1 kg)  Height: 5' 1.2" (1.554 m)   Body mass index is 43.89 kg/m.   Objective:  Physical Exam Vitals and nursing note reviewed.  Constitutional:      Appearance: Normal appearance. She is obese.  HENT:     Head: Normocephalic and atraumatic.  Cardiovascular:     Rate and  Rhythm: Normal rate and regular rhythm.     Heart sounds: Normal heart sounds.  Pulmonary:     Effort: Pulmonary effort is normal.     Breath sounds: Normal breath sounds.  Skin:    General: Skin is warm.  Neurological:     General: No focal deficit present.     Mental Status: She is alert.  Psychiatric:        Mood and Affect: Mood normal.        Behavior: Behavior normal.         Assessment And Plan:     1. Stress and adjustment reaction  We discussed at length other options for childcare. She is encouraged to contact [email or web address] for a nanny that can care for her child, in the home - this way she will feel more at ease. We also discussed other housing options instead of home buying, so she can move into her own space quicker.   2. Essential hypertension, benign  Fair control. She will continue with current meds. Encouraged to incorporate more exercise into her daily routine. She will rto as scheduled.   [name], MD    THE PATIENT IS ENCOURAGED TO PRACTICE SOCIAL DISTANCING DUE TO THE COVID-19 PANDEMIC.

## 2020-05-15 ENCOUNTER — Ambulatory Visit: Payer: Self-pay

## 2020-05-26 ENCOUNTER — Ambulatory Visit (INDEPENDENT_AMBULATORY_CARE_PROVIDER_SITE_OTHER): Payer: BC Managed Care – PPO | Admitting: Internal Medicine

## 2020-05-26 ENCOUNTER — Other Ambulatory Visit: Payer: Self-pay

## 2020-05-26 ENCOUNTER — Encounter: Payer: Self-pay | Admitting: Internal Medicine

## 2020-05-26 VITALS — BP 136/90 | HR 94 | Temp 98.4°F | Ht 60.2 in | Wt 233.6 lb

## 2020-05-26 DIAGNOSIS — E559 Vitamin D deficiency, unspecified: Secondary | ICD-10-CM

## 2020-05-26 DIAGNOSIS — Z1159 Encounter for screening for other viral diseases: Secondary | ICD-10-CM

## 2020-05-26 DIAGNOSIS — I10 Essential (primary) hypertension: Secondary | ICD-10-CM | POA: Diagnosis not present

## 2020-05-26 DIAGNOSIS — Z0000 Encounter for general adult medical examination without abnormal findings: Secondary | ICD-10-CM

## 2020-05-26 DIAGNOSIS — Z6841 Body Mass Index (BMI) 40.0 and over, adult: Secondary | ICD-10-CM

## 2020-05-26 LAB — POCT URINALYSIS DIPSTICK
Bilirubin, UA: NEGATIVE
Blood, UA: NEGATIVE
Glucose, UA: NEGATIVE
Ketones, UA: NEGATIVE
Nitrite, UA: NEGATIVE
Protein, UA: NEGATIVE
Spec Grav, UA: 1.02 (ref 1.010–1.025)
Urobilinogen, UA: 0.2 E.U./dL
pH, UA: 6 (ref 5.0–8.0)

## 2020-05-26 LAB — POCT UA - MICROALBUMIN
Albumin/Creatinine Ratio, Urine, POC: <30
Creatinine, POC: 300 mg/dL
Microalbumin Ur, POC: 30 mg/L

## 2020-05-26 MED ORDER — QSYMIA 7.5-46 MG PO CP24: 7.5000 mg | ORAL_CAPSULE | Freq: Every morning | ORAL | 1 refills | Status: DC

## 2020-05-26 NOTE — Patient Instructions (Signed)

## 2020-05-26 NOTE — Progress Notes (Signed)
I,Katawbba Wiggins,acting as a Education administrator for [name], MD.,have documented all relevant documentation on the behalf of [name], MD,as directed by  [name], MD while in the presence of [name], MD. This visit occurred during the SARS-CoV-2 public health emergency.  Safety protocols were in place, including screening questions prior to the visit, additional usage of staff PPE, and extensive cleaning of exam room while observing appropriate contact time as indicated for disinfecting solutions.  Subjective:     Patient ID: Angel Byrd , female    DOB: 1996-07-08 , 24 y.o.   MRN: [id number]   Chief Complaint  Patient presents with  . Annual Exam  . Hypertension    HPI  The patient is here today for a physical examination. She is followed by [facility] for her GYN care. She denies having any chest pain, shortness of breath and headaches. Hypertension This is a chronic problem. The current episode started more than 1 year ago. The problem has been gradually improving since onset. The problem is controlled. Pertinent negatives include no blurred vision, chest pain, palpitations or shortness of breath. Risk factors for coronary artery disease include obesity and sedentary lifestyle. Past treatments include beta blockers.     Past Medical History:  Diagnosis Date  . HTN (hypertension)   . Iron deficiency anemia      Family History  Problem Relation Age of Onset  . Sickle cell anemia Mother   . Heart disease Father   . Hypertension Sister      Current Outpatient Medications:  .  nebivolol (BYSTOLIC) 10 MG tablet, Take 1 tablet (10 mg total) by mouth daily., Disp: 30 tablet, Rfl: 1 .  Phentermine-Topiramate (QSYMIA) 7.5-46 MG CP24, Take 7.5 mg by mouth every morning., Disp: 30 capsule, Rfl: 1   No Known Allergies    The patient states she uses abstinence for birth control. Last LMP was Patient's last menstrual period was 05/13/2020.. Negative for Dysmenorrhea.  Negative for: breast discharge, breast lump(s), breast pain and breast self exam. Associated symptoms include abnormal vaginal bleeding. Pertinent negatives include abnormal bleeding (hematology), anxiety, decreased libido, depression, difficulty falling sleep, dyspareunia, history of infertility, nocturia, sexual dysfunction, sleep disturbances, urinary incontinence, urinary urgency, vaginal discharge and vaginal itching. Diet regular.The patient states her exercise level is    . The patient's tobacco use is:  Social History   Tobacco Use  Smoking Status Never Smoker  Smokeless Tobacco Never Used  . She has been exposed to passive smoke. The patient's alcohol use is:  Social History   Substance and Sexual Activity  Alcohol Use No     Review of Systems  Constitutional: Negative.   HENT: Negative.   Eyes: Negative.  Negative for blurred vision.  Respiratory: Negative.  Negative for shortness of breath.   Cardiovascular: Negative.  Negative for chest pain and palpitations.  Gastrointestinal: Negative.   Endocrine: Negative.   Genitourinary: Negative.   Musculoskeletal: Negative.   Skin: Negative.   Allergic/Immunologic: Negative.   Neurological: Negative.   Hematological: Negative.   Psychiatric/Behavioral: Negative.   All other systems reviewed and are negative.    Today's Vitals   05/26/20 1510  BP: 136/90  Pulse: 94  Temp: 98.4 F (36.9 C)  Weight: 233 lb 9.6 oz (106 kg)  Height: 5' 0.2" (1.529 m)   Body mass index is 45.32 kg/m.  Wt Readings from Last 3 Encounters:  05/26/20 233 lb 9.6 oz (106 kg)  04/30/20 233 lb 12.8 oz (  106.1 kg)  03/12/20 229 lb 9.6 oz (104.1 kg)   Objective:  Physical Exam Vitals and nursing note reviewed.  Constitutional:      General: She is not in acute distress.    Appearance: Normal appearance. She is well-developed. She is obese.  HENT:     Head: Normocephalic and atraumatic.     Right Ear: Hearing, tympanic membrane, ear canal and  external ear normal. There is no impacted cerumen.     Left Ear: Hearing, tympanic membrane, ear canal and external ear normal. There is no impacted cerumen.     Nose:     Comments: Deferred, masked    Mouth/Throat:     Comments: Deferred, masked Eyes:     General: Lids are normal.     Extraocular Movements: Extraocular movements intact.     Conjunctiva/sclera: Conjunctivae normal.     Pupils: Pupils are equal, round, and reactive to light.     Funduscopic exam:    Right eye: No papilledema.        Left eye: No papilledema.  Neck:     Thyroid: No thyroid mass.     Vascular: No carotid bruit.  Cardiovascular:     Rate and Rhythm: Normal rate and regular rhythm.     Pulses: Normal pulses.     Heart sounds: Normal heart sounds. No murmur heard.   Pulmonary:     Effort: Pulmonary effort is normal.     Breath sounds: Normal breath sounds.  Chest:     Breasts: Tanner Score is 5.        Right: Normal.        Left: Normal.  Abdominal:     General: Bowel sounds are normal. There is no distension.     Palpations: Abdomen is soft.     Tenderness: There is no abdominal tenderness.  Genitourinary:    Rectum: Guaiac result negative.  Musculoskeletal:        General: No swelling. Normal range of motion.     Cervical back: Full passive range of motion without pain, normal range of motion and neck supple.     Right lower leg: No edema.     Left lower leg: No edema.  Skin:    General: Skin is warm and dry.     Capillary Refill: Capillary refill takes less than 2 seconds.  Neurological:     General: No focal deficit present.     Mental Status: She is alert and oriented to person, place, and time.     Cranial Nerves: No cranial nerve deficit.     Sensory: No sensory deficit.  Psychiatric:        Mood and Affect: Mood normal.        Behavior: Behavior normal.        Thought Content: Thought content normal.        Judgment: Judgment normal.         Assessment And Plan:       1.  Encounter for annual physical exam  A full exam was performed. Importance of monthly self breast exams was discussed with the patient. PATIENT IS ADVISED TO GET 30-45 MINUTES REGULAR EXERCISE NO LESS THAN FOUR TO FIVE DAYS PER WEEK - BOTH WEIGHTBEARING EXERCISES AND AEROBIC ARE RECOMMENDED.  PATIENT IS ADVISED TO FOLLOW A HEALTHY DIET WITH AT LEAST SIX FRUITS/VEGGIES PER DAY, DECREASE INTAKE OF RED MEAT, AND TO INCREASE FISH INTAKE TO TWO DAYS PER WEEK.  MEATS/FISH SHOULD NOT BE FRIED, BAKED  OR BROILED IS PREFERABLE.  I SUGGEST WEARING SPF 50 SUNSCREEN ON EXPOSED PARTS AND ESPECIALLY WHEN IN THE DIRECT SUNLIGHT FOR AN EXTENDED PERIOD OF TIME.  PLEASE AVOID FAST FOOD RESTAURANTS AND INCREASE YOUR WATER INTAKE.  - CBC - Hemoglobin A1c - CMP14+EGFR - Lipid panel  2. Essential hypertension, benign  Chronic, fair control. She will continue with current meds. She is encouraged to avoid adding salt to her foods. EKG performed, NSR w/o acute changes. She will rto in six months for re-evaluation.   - POCT Urinalysis Dipstick (81002) - POCT UA - Microalbumin - EKG 12-Lead  3. Class 3 severe obesity due to excess calories with serious comorbidity and body mass index (BMI) of 45.0 to 49.9 in adult ([place])  SHE WISHES TO RESUME QSYMIA FOR WEIGHT LOSS.  SHE WAS GIVEN RX QSYMIA 7.5/46MG DAILY.  POSSIBLE SIDE EFFECTS INCLUDING HEADACHE, DRY MOUTH, DIZZINESS AND PALPITATIONS WERE DISCUSSED WITH THE PATIENT. SHE WAS ENCOURAGED TO INCREASE HER WATER INTAKE WHILE ON THIS MEDICATION. SHE WILL RTO IN SIX WEEKS FOR RE-EVALUATION. SHE WILL CALL SOONER IF SHE HAS ANY PROBLEMS WITH THE MEDICATION.  - Phentermine-Topiramate (QSYMIA) 7.5-46 MG CP24; Take 7.5 mg by mouth every morning.  Dispense: 30 capsule; Refill: 1  4. Encounter for HCV screening test for low risk patient  - Hepatitis C antibody  5. Vitamin D deficiency  I WILL CHECK A VIT D LEVEL AND SUPPLEMENT AS NEEDED.  ALSO ENCOURAGED TO SPEND 15 MINUTES IN  THE SUN DAILY.  - VITAMIN D 25 Hydroxy (Vit-D Deficiency, Fractures)   Patient was given opportunity to ask questions. Patient verbalized understanding of the plan and was able to repeat key elements of the plan. All questions were answered to their satisfaction.   [name], MD   I, [name], MD, have reviewed all documentation for this visit. The documentation on 05/26/20 for the exam, diagnosis, procedures, and orders are all accurate and complete.  THE PATIENT IS ENCOURAGED TO PRACTICE SOCIAL DISTANCING DUE TO THE COVID-19 PANDEMIC.

## 2020-05-27 ENCOUNTER — Telehealth: Payer: Self-pay

## 2020-05-27 ENCOUNTER — Other Ambulatory Visit: Payer: Self-pay | Admitting: Internal Medicine

## 2020-05-27 LAB — CBC
Hematocrit: 34.8 % (ref 34.0–46.6)
Hemoglobin: 10.8 g/dL — ABNORMAL LOW (ref 11.1–15.9)
MCH: 24.7 pg — ABNORMAL LOW (ref 26.6–33.0)
MCHC: 31 g/dL — ABNORMAL LOW (ref 31.5–35.7)
MCV: 80 fL (ref 79–97)
Platelets: 440 10*3/uL (ref 150–450)
RBC: 4.37 x10E6/uL (ref 3.77–5.28)
RDW: 15.3 % (ref 11.7–15.4)
WBC: 9.6 10*3/uL (ref 3.4–10.8)

## 2020-05-27 LAB — HEMOGLOBIN A1C
Est. average glucose Bld gHb Est-mCnc: 111 mg/dL
Hgb A1c MFr Bld: 5.5 % (ref 4.8–5.6)

## 2020-05-27 LAB — CMP14+EGFR
ALT: 6 IU/L (ref 0–32)
AST: 12 IU/L (ref 0–40)
Albumin/Globulin Ratio: 1.5 (ref 1.2–2.2)
Albumin: 4.6 g/dL (ref 3.9–5.0)
Alkaline Phosphatase: 79 IU/L (ref 48–121)
BUN/Creatinine Ratio: 12 (ref 9–23)
BUN: 10 mg/dL (ref 6–20)
Bilirubin Total: 0.3 mg/dL (ref 0.0–1.2)
CO2: 19 mmol/L — ABNORMAL LOW (ref 20–29)
Calcium: 9.6 mg/dL (ref 8.7–10.2)
Chloride: 106 mmol/L (ref 96–106)
Creatinine, Ser: 0.81 mg/dL (ref 0.57–1.00)
GFR calc Af Amer: 116 mL/min/{1.73_m2} (ref >59)
GFR calc non Af Amer: 101 mL/min/{1.73_m2} (ref >59)
Globulin, Total: 3 g/dL (ref 1.5–4.5)
Glucose: 92 mg/dL (ref 65–99)
Potassium: 4 mmol/L (ref 3.5–5.2)
Sodium: 141 mmol/L (ref 134–144)
Total Protein: 7.6 g/dL (ref 6.0–8.5)

## 2020-05-27 LAB — LIPID PANEL
Chol/HDL Ratio: 4 ratio (ref 0.0–4.4)
Cholesterol, Total: 186 mg/dL (ref 100–199)
HDL: 47 mg/dL (ref >39)
LDL Chol Calc (NIH): 122 mg/dL — ABNORMAL HIGH (ref 0–99)
Triglycerides: 94 mg/dL (ref 0–149)
VLDL Cholesterol Cal: 17 mg/dL (ref 5–40)

## 2020-05-27 LAB — HEPATITIS C ANTIBODY: Hep C Virus Ab: 0.1 s/co ratio (ref 0.0–0.9)

## 2020-05-27 LAB — VITAMIN D 25 HYDROXY (VIT D DEFICIENCY, FRACTURES): Vit D, 25-Hydroxy: 15 ng/mL — ABNORMAL LOW (ref 30.0–100.0)

## 2020-05-27 MED ORDER — VITAMIN D (ERGOCALCIFEROL) 1.25 MG (50000 UNIT) PO CAPS
ORAL_CAPSULE | ORAL | 1 refills | Status: DC
Start: 2020-05-27 — End: 2020-12-03

## 2020-05-27 NOTE — Telephone Encounter (Signed)
PA STARTED FOR QSYMIA 7.5-46MG  CAPSULE KEY#[id number]

## 2020-05-27 NOTE — Telephone Encounter (Signed)
PA KEY#[id number] QSYMIA 7.5-46MG  APPROVED 05/27/2020-08/27/2020

## 2020-05-28 MED FILL — VIT D2 1.25 MG (50,000 UNIT: 1.25 MG | 84 days supply | Qty: 24 | Fill #0

## 2020-05-29 ENCOUNTER — Ambulatory Visit (INDEPENDENT_AMBULATORY_CARE_PROVIDER_SITE_OTHER): Payer: BC Managed Care – PPO

## 2020-05-29 DIAGNOSIS — Z23 Encounter for immunization: Secondary | ICD-10-CM

## 2020-05-29 NOTE — Progress Notes (Signed)
   Covid-19 Vaccination Clinic  Name:  Angel Byrd    MRN: [id number] DOB: [birth year]  05/29/2020  Angel Byrd was observed post Covid-19 immunization for 15 minutes without incident. She was provided with Vaccine Information Sheet and instruction to access the V-Safe system.   Angel Byrd was instructed to call 911 with any severe reactions post vaccine: Marland Kitchen Difficulty breathing  . Swelling of face and throat  . A fast heartbeat  . A bad rash all over body  . Dizziness and weakness   Immunizations Administered    Name Date Dose VIS Date Route   Moderna COVID-19 Vaccine 05/29/2020 10:23 AM 0.5 mL 10/2019 Intramuscular   Manufacturer: Moderna   Lot: [id number]   [place]: 90240-973-53

## 2020-06-03 ENCOUNTER — Other Ambulatory Visit: Payer: Self-pay | Admitting: Internal Medicine

## 2020-06-03 MED FILL — TRIAMCINOLONE 0.1% CREAM: 0.1 | 30 days supply | Qty: 60 | Fill #0

## 2020-06-08 MED FILL — LO LOESTRIN FE 1-10 TABLET: 1 MG-10 MCG | 28 days supply | Qty: 28 | Fill #0

## 2020-07-21 ENCOUNTER — Ambulatory Visit: Payer: BC Managed Care – PPO | Admitting: Internal Medicine

## 2020-10-06 ENCOUNTER — Encounter: Payer: Self-pay | Admitting: Internal Medicine

## 2020-10-06 ENCOUNTER — Ambulatory Visit (INDEPENDENT_AMBULATORY_CARE_PROVIDER_SITE_OTHER): Payer: BC Managed Care – PPO | Admitting: Internal Medicine

## 2020-10-06 ENCOUNTER — Other Ambulatory Visit: Payer: Self-pay

## 2020-10-06 ENCOUNTER — Other Ambulatory Visit: Payer: Self-pay | Admitting: Internal Medicine

## 2020-10-06 VITALS — BP 134/96 | HR 72 | Temp 98.2°F | Ht 60.2 in | Wt 217.0 lb

## 2020-10-06 DIAGNOSIS — L304 Erythema intertrigo: Secondary | ICD-10-CM

## 2020-10-06 DIAGNOSIS — Z6841 Body Mass Index (BMI) 40.0 and over, adult: Secondary | ICD-10-CM

## 2020-10-06 DIAGNOSIS — I10 Essential (primary) hypertension: Secondary | ICD-10-CM | POA: Diagnosis not present

## 2020-10-06 MED ORDER — NYSTATIN 100000 UNIT/GM EX CREA: 1.0000 "application " | TOPICAL_CREAM | Freq: Two times a day (BID) | CUTANEOUS | 0 refills | Status: DC

## 2020-10-06 MED FILL — NYSTATIN 100,000 UNIT/GM CR: 100000 | 10 days supply | Qty: 30 | Fill #0

## 2020-10-06 NOTE — Patient Instructions (Signed)

## 2020-10-06 NOTE — Progress Notes (Signed)
I,Katawbba Wiggins,acting as a Education administrator for [name], MD.,have documented all relevant documentation on the behalf of [name], MD,as directed by  [name], MD while in the presence of [name], MD.  This visit occurred during the SARS-CoV-2 public health emergency.  Safety protocols were in place, including screening questions prior to the visit, additional usage of staff PPE, and extensive cleaning of exam room while observing appropriate contact time as indicated for disinfecting solutions.  Subjective:     Patient ID: Angel Byrd , female    DOB: [birth year] , 24 y.o.   MRN: [id number]   Chief Complaint  Patient presents with  . Hypertension    HPI  She is here today for BP check. She reports compliance with meds. Denies headaches, chest pain and shortness of breath.   She is enjoying motherhood. States her baby keeps her busy, but she loves him dearly.   Hypertension This is a chronic problem. The current episode started more than 1 year ago. The problem has been gradually improving since onset. The problem is uncontrolled. Pertinent negatives include no blurred vision, chest pain, palpitations or shortness of breath. Risk factors for coronary artery disease include obesity and sedentary lifestyle. Past treatments include calcium channel blockers and beta blockers. The current treatment provides moderate improvement. Compliance problems include exercise.      Past Medical History:  Diagnosis Date  . HTN (hypertension)   . Iron deficiency anemia      Family History  Problem Relation Age of Onset  . Sickle cell anemia Mother   . Heart disease Father   . Hypertension Sister      Current Outpatient Medications:  .  nebivolol (BYSTOLIC) 10 MG tablet, Take 1 tablet (10 mg total) by mouth daily., Disp: 30 tablet, Rfl: 1 .  nystatin cream (MYCOSTATIN), Apply 1 application topically 2 (two) times daily., Disp: 30 g, Rfl: 0 .  Phentermine-Topiramate  (QSYMIA) 7.5-46 MG CP24, Take 7.5 mg by mouth every morning. (Patient not taking: Reported on 10/06/2020), Disp: 30 capsule, Rfl: 1 .  triamcinolone cream (KENALOG) 0.1 %, APPLY TWO TIMES DAILY . USE AS NEEDED (Patient not taking: Reported on 10/06/2020), Disp: 60 g, Rfl: 0 .  Vitamin D, Ergocalciferol, (DRISDOL) 1.25 MG (50000 UNIT) CAPS capsule, Please take one capsule po twice weekly on Tuesday/Fridays (Patient not taking: Reported on 10/06/2020), Disp: 24 capsule, Rfl: 1   No Known Allergies   Review of Systems  Constitutional: Negative.   Eyes: Negative for blurred vision.  Respiratory: Negative.  Negative for shortness of breath.   Cardiovascular: Negative.  Negative for chest pain and palpitations.  Gastrointestinal: Negative.   Skin: Positive for rash (under both breast).       She c/o rash underneath breasts. It is red and itchy.  She used her son's cream which appears to have lightened the area - but sx have not resolved. Denies seeing any blisters.   Neurological: Negative.   Psychiatric/Behavioral: Negative.      Today's Vitals   10/06/20 1116  BP: (!) 134/96  Pulse: 72  Temp: 98.2 F (36.8 C)  TempSrc: Oral  Weight: 217 lb (98.4 kg)  Height: 5' 0.2" (1.529 m)   Body mass index is 42.1 kg/m.  Wt Readings from Last 3 Encounters:  10/06/20 217 lb (98.4 kg)  05/26/20 233 lb 9.6 oz (106 kg)  04/30/20 233 lb 12.8 oz (106.1 kg)   Objective:  Physical Exam Vitals and nursing note reviewed.  Constitutional:      Appearance: Normal appearance. She is obese.  HENT:     Head: Normocephalic and atraumatic.  Cardiovascular:     Rate and Rhythm: Normal rate and regular rhythm.     Heart sounds: Normal heart sounds.  Pulmonary:     Effort: Pulmonary effort is normal.     Breath sounds: Normal breath sounds.  Musculoskeletal:     Cervical back: Normal range of motion.  Skin:    General: Skin is warm.     Comments: There is hyper- and hypopigmented areas underneath both  breasts. No vesicular lesions noted.  Neurological:     General: No focal deficit present.     Mental Status: She is alert.  Psychiatric:        Mood and Affect: Mood normal.        Behavior: Behavior normal.         Assessment And Plan:     1. Essential hypertension, benign Comments: Chronic, fair control. Encouraged to take meds as directed. She will f/u as previously scheduled Jan/Feb 2022.  2. Intertrigo Comments: She was given rx nystatin cream to affected area twice daily. Advised to contact me in a week or two to let me know how this is working for her.   3. Class 3 severe obesity due to excess calories with serious comorbidity and body mass index (BMI) of 40.0 to 44.9 in adult [facility]) Comments: She was congratulated on her 16 pound weight loss since July 2021. Encouraged to aim to lose 7-10 pounds prior to her next visit.      Patient was given opportunity to ask questions. Patient verbalized understanding of the plan and was able to repeat key elements of the plan. All questions were answered to their satisfaction.  [name], MD   I, [name], MD, have reviewed all documentation for this visit. The documentation on 10/17/20 for the exam, diagnosis, procedures, and orders are all accurate and complete.  THE PATIENT IS ENCOURAGED TO PRACTICE SOCIAL DISTANCING DUE TO THE COVID-19 PANDEMIC.

## 2020-10-26 ENCOUNTER — Other Ambulatory Visit: Payer: Self-pay | Admitting: Internal Medicine

## 2020-10-26 MED FILL — NYSTATIN 100,000 UNIT/GM CR: 100000 | 7 days supply | Qty: 30 | Fill #0

## 2020-12-01 ENCOUNTER — Ambulatory Visit: Payer: BC Managed Care – PPO | Admitting: Internal Medicine

## 2020-12-02 ENCOUNTER — Encounter: Payer: Self-pay | Admitting: Internal Medicine

## 2020-12-03 ENCOUNTER — Other Ambulatory Visit: Payer: Self-pay

## 2020-12-03 ENCOUNTER — Ambulatory Visit (INDEPENDENT_AMBULATORY_CARE_PROVIDER_SITE_OTHER): Payer: BC Managed Care – PPO | Admitting: Nurse Practitioner

## 2020-12-03 ENCOUNTER — Other Ambulatory Visit: Payer: Self-pay | Admitting: Nurse Practitioner

## 2020-12-03 VITALS — BP 138/78 | HR 86 | Temp 98.6°F | Ht 60.2 in | Wt 226.0 lb

## 2020-12-03 DIAGNOSIS — B373 Candidiasis of vulva and vagina: Secondary | ICD-10-CM | POA: Diagnosis not present

## 2020-12-03 DIAGNOSIS — R82998 Other abnormal findings in urine: Secondary | ICD-10-CM | POA: Diagnosis not present

## 2020-12-03 DIAGNOSIS — N898 Other specified noninflammatory disorders of vagina: Secondary | ICD-10-CM

## 2020-12-03 DIAGNOSIS — B3731 Acute candidiasis of vulva and vagina: Secondary | ICD-10-CM

## 2020-12-03 LAB — POCT URINALYSIS DIPSTICK
Bilirubin, UA: NEGATIVE
Blood, UA: NEGATIVE
Glucose, UA: NEGATIVE
Ketones, UA: NEGATIVE
Nitrite, UA: NEGATIVE
Protein, UA: NEGATIVE
Spec Grav, UA: 1.025 (ref 1.010–1.025)
Urobilinogen, UA: 0.2 E.U./dL
pH, UA: 6 (ref 5.0–8.0)

## 2020-12-03 MED ORDER — FLUCONAZOLE 150 MG PO TABS: ORAL_TABLET | ORAL | 0 refills | Status: DC

## 2020-12-03 MED FILL — FLUCONAZOLE 150 MG TABS: 150 | 3 days supply | Qty: 2 | Fill #0

## 2020-12-03 NOTE — Progress Notes (Signed)
I,Newell Wafer,acting as a Education administrator for [employer], NP.,have documented all relevant documentation on the behalf of [employer], NP,as directed by  Bary Castilla, NP while in the presence of Bary Castilla, NP.  This visit occurred during the SARS-CoV-2 public health emergency.  Safety protocols were in place, including screening questions prior to the visit, additional usage of staff PPE, and extensive cleaning of exam room while observing appropriate contact time as indicated for disinfecting solutions.  Subjective:     Patient ID: Angel Byrd , female    DOB: [birth year] , 25 y.o.   MRN: [id number]   Chief Complaint  Patient presents with  . Vaginal Pain    HPI  Patient is here for vaginal irritation. She has noticed some irritation over the last 2 days. She is not currently sexually active. She has some clear discharge. Itchy   Vaginal Pain The patient's primary symptoms include genital itching. The patient's pertinent negatives include no pelvic pain. This is a new problem. The current episode started in the past 7 days. The problem occurs constantly. The problem has been unchanged. The patient is experiencing no pain. She is not pregnant. Pertinent negatives include no dysuria. Nothing aggravates the symptoms. She has tried nothing for the symptoms. The treatment provided no relief. She is not sexually active. No, her partner does not have an STD. She uses nothing for contraception. Her menstrual history has been regular. Her past medical history is significant for a Cesarean section.     Past Medical History:  Diagnosis Date  . HTN (hypertension)   . Iron deficiency anemia      Family History  Problem Relation Age of Onset  . Sickle cell anemia Mother   . Heart disease Father   . Hypertension Sister      Current Outpatient Medications:  .  fluconazole (DIFLUCAN) 150 MG tablet, Take one tablet by mouth today and take the 2nd tablet 72 hours later.,  Disp: 2 tablet, Rfl: 0 .  nebivolol (BYSTOLIC) 10 MG tablet, Take 1 tablet (10 mg total) by mouth daily., Disp: 30 tablet, Rfl: 1 .  nystatin cream (MYCOSTATIN), APPLY 1 APPLICATION TOPICALLY 2 (TWO) TIMES DAILY., Disp: 30 g, Rfl: 0 .  triamcinolone cream (KENALOG) 0.1 %, APPLY TWO TIMES DAILY . USE AS NEEDED, Disp: 60 g, Rfl: 0   No Known Allergies   Review of Systems  Constitutional: Negative.   HENT: Negative for congestion and sinus pain.   Respiratory: Negative.  Negative for shortness of breath and wheezing.   Cardiovascular: Negative.   Gastrointestinal: Negative.   Genitourinary: Positive for vaginal pain. Negative for dysuria and pelvic pain.  Neurological: Negative.      Today's Vitals   12/03/20 0856  BP: 138/78  Pulse: 86  Temp: 98.6 F (37 C)  TempSrc: Oral  Weight: 226 lb (102.5 kg)  Height: 5' 0.2" (1.529 m)   Body mass index is 43.84 kg/m.   Objective:  Physical Exam Constitutional:      Appearance: Normal appearance. She is obese.  HENT:     Head: Normocephalic and atraumatic.  Cardiovascular:     Rate and Rhythm: Normal rate and regular rhythm.     Pulses: Normal pulses.     Heart sounds: Normal heart sounds. No murmur heard.   Pulmonary:     Effort: Pulmonary effort is normal. No respiratory distress.     Breath sounds: Normal breath sounds. No wheezing.  Neurological:     Mental Status: She is  alert.  Psychiatric:        Mood and Affect: Mood normal.        Behavior: Behavior normal.        Thought Content: Thought content normal.        Judgment: Judgment normal.         Assessment And Plan:     1. Vaginal irritation - will check for yeast infection  - POCT Urinalysis Dipstick (81002) - fluconazole (DIFLUCAN) 150 MG tablet; Take one tablet by mouth today and take the 2nd tablet 72 hours later.  Dispense: 2 tablet; Refill: 0  2. Vaginal yeast infection -will go ahead treat for yeast infection and send urine for culture.  -  fluconazole (DIFLUCAN) 150 MG tablet; Take one tablet by mouth today and take the 2nd tablet 72 hours later.  Dispense: 2 tablet; Refill: 0  3. Leukocytes in urine - Culture, Urine - fluconazole (DIFLUCAN) 150 MG tablet; Take one tablet by mouth today and take the 2nd tablet 72 hours later.  Dispense: 2 tablet; Refill: 0    Patient was given opportunity to ask questions. Patient verbalized understanding of the plan and was able to repeat key elements of the plan. All questions were answered to their satisfaction.  Bary Castilla, NP   I, Bary Castilla, NP, have reviewed all documentation for this visit. The documentation on 12/03/20 for the exam, diagnosis, procedures, and orders are all accurate and complete.  THE PATIENT IS ENCOURAGED TO PRACTICE SOCIAL DISTANCING DUE TO THE COVID-19 PANDEMIC.

## 2020-12-03 NOTE — Patient Instructions (Signed)
Vaginal Yeast Infection, Adult  Vaginal yeast infection is a condition that causes vaginal discharge as well as soreness, swelling, and redness (inflammation) of the vagina. This is a common condition. Some women get this infection frequently. What are the causes? This condition is caused by a change in the normal balance of the yeast (candida) and bacteria that live in the vagina. This change causes an overgrowth of yeast, which causes the inflammation. What increases the risk? The condition is more likely to develop in women who:  Take antibiotic medicines.  Have diabetes.  Take birth control pills.  Are pregnant.  Douche often.  Have a weak body defense system (immune system).  Have been taking steroid medicines for a long time.  Frequently wear tight clothing. What are the signs or symptoms? Symptoms of this condition include:  White, thick, creamy vaginal discharge.  Swelling, itching, redness, and irritation of the vagina. The lips of the vagina (vulva) may be affected as well.  Pain or a burning feeling while urinating.  Pain during sex. How is this diagnosed? This condition is diagnosed based on:  Your medical history.  A physical exam.  A pelvic exam. Your health care provider will examine a sample of your vaginal discharge under a microscope. Your health care provider may send this sample for testing to confirm the diagnosis. How is this treated? This condition is treated with medicine. Medicines may be over-the-counter or prescription. You may be told to use one or more of the following:  Medicine that is taken by mouth (orally).  Medicine that is applied as a cream (topically).  Medicine that is inserted directly into the vagina (suppository). Follow these instructions at home: Lifestyle  Do not have sex until your health care provider approves. Tell your sex partner that you have a yeast infection. That person should go to his or her health care  provider and ask if they should also be treated.  Do not wear tight clothes, such as pantyhose or tight pants.  Wear breathable cotton underwear. General instructions  Take or apply over-the-counter and prescription medicines only as told by your health care provider.  Eat more yogurt. This may help to keep your yeast infection from returning.  Do not use tampons until your health care provider approves.  Try taking a sitz bath to help with discomfort. This is a warm water bath that is taken while you are sitting down. The water should only come up to your hips and should cover your buttocks. Do this 3-4 times per day or as told by your health care provider.  Do not douche.  If you have diabetes, keep your blood sugar levels under control.  Keep all follow-up visits as told by your health care provider. This is important.   Contact a health care provider if:  You have a fever.  Your symptoms go away and then return.  Your symptoms do not get better with treatment.  Your symptoms get worse.  You have new symptoms.  You develop blisters in or around your vagina.  You have blood coming from your vagina and it is not your menstrual period.  You develop pain in your abdomen. Summary  Vaginal yeast infection is a condition that causes discharge as well as soreness, swelling, and redness (inflammation) of the vagina.  This condition is treated with medicine. Medicines may be over-the-counter or prescription.  Take or apply over-the-counter and prescription medicines only as told by your health care provider.  Do not   douche. Do not have sex or use tampons until your health care provider approves.  Contact a health care provider if your symptoms do not get better with treatment or your symptoms go away and then return. This information is not intended to replace advice given to you by your health care provider. Make sure you discuss any questions you have with your health care  provider. Document Revised: 05/24/2019 Document Reviewed: 03/12/2018 Elsevier Patient Education  2021 Elsevier Inc.  

## 2020-12-04 LAB — URINE CULTURE

## 2020-12-23 ENCOUNTER — Ambulatory Visit: Payer: BC Managed Care – PPO | Admitting: Internal Medicine

## 2021-01-21 ENCOUNTER — Ambulatory Visit (INDEPENDENT_AMBULATORY_CARE_PROVIDER_SITE_OTHER): Payer: BC Managed Care – PPO | Admitting: Internal Medicine

## 2021-01-21 ENCOUNTER — Other Ambulatory Visit: Payer: Self-pay | Admitting: Internal Medicine

## 2021-01-21 ENCOUNTER — Other Ambulatory Visit: Payer: Self-pay

## 2021-01-21 ENCOUNTER — Encounter: Payer: Self-pay | Admitting: Internal Medicine

## 2021-01-21 VITALS — BP 146/98 | HR 108 | Temp 98.1°F | Resp 14 | Ht 60.2 in | Wt 230.0 lb

## 2021-01-21 DIAGNOSIS — R635 Abnormal weight gain: Secondary | ICD-10-CM

## 2021-01-21 DIAGNOSIS — Z6841 Body Mass Index (BMI) 40.0 and over, adult: Secondary | ICD-10-CM | POA: Diagnosis not present

## 2021-01-21 DIAGNOSIS — I10 Essential (primary) hypertension: Secondary | ICD-10-CM | POA: Diagnosis not present

## 2021-01-21 MED ORDER — WEGOVY 0.5 MG/0.5ML ~~LOC~~ SOAJ: 0.5000 mg | SUBCUTANEOUS | 1 refills | Status: DC

## 2021-01-21 NOTE — Patient Instructions (Signed)

## 2021-01-21 NOTE — Progress Notes (Signed)
I,Katawbba Wiggins,acting as a Education administrator for [name], MD.,have documented all relevant documentation on the behalf of [name], MD,as directed by  [name], MD while in the presence of [name], MD.  This visit occurred during the SARS-CoV-2 public health emergency.  Safety protocols were in place, including screening questions prior to the visit, additional usage of staff PPE, and extensive cleaning of exam room while observing appropriate contact time as indicated for disinfecting solutions.  Subjective:     Patient ID: Angel Byrd , female    DOB: 10/31/1996 , 25 y.o.   MRN: [id number]   Chief Complaint  Patient presents with  . Hypertension    HPI  She is here today for BP check. She reports compliance with meds.  She denies headaches, chest pain and shortness of breath.   Hypertension This is a chronic problem. The current episode started more than 1 year ago. The problem has been gradually improving since onset. The problem is uncontrolled. Associated symptoms include headaches. Pertinent negatives include no blurred vision, chest pain, palpitations or shortness of breath. Risk factors for coronary artery disease include obesity and sedentary lifestyle. Past treatments include calcium channel blockers and beta blockers. The current treatment provides moderate improvement. Compliance problems include exercise.      Past Medical History:  Diagnosis Date  . HTN (hypertension)   . Iron deficiency anemia      Family History  Problem Relation Age of Onset  . Sickle cell anemia Mother   . Heart disease Father   . Hypertension Sister      Current Outpatient Medications:  .  Semaglutide-Weight Management (WEGOVY) 0.5 MG/0.5ML SOAJ, Inject 0.5 mg into the skin once a week., Disp: 2 mL, Rfl: 1 .  fluconazole (DIFLUCAN) 150 MG tablet, Take one tablet by mouth today and take the 2nd tablet 72 hours later., Disp: 2 tablet, Rfl: 0 .  nebivolol (BYSTOLIC)  10 MG tablet, Take 1 tablet (10 mg total) by mouth daily., Disp: 30 tablet, Rfl: 1 .  nystatin cream (MYCOSTATIN), APPLY 1 APPLICATION TOPICALLY 2 (TWO) TIMES DAILY., Disp: 30 g, Rfl: 0 .  triamcinolone cream (KENALOG) 0.1 %, APPLY TWO TIMES DAILY . USE AS NEEDED, Disp: 60 g, Rfl: 0   No Known Allergies   Review of Systems  Constitutional: Negative.   Eyes: Negative for blurred vision.  Respiratory: Negative.  Negative for shortness of breath.   Cardiovascular: Negative.  Negative for chest pain and palpitations.  Gastrointestinal: Negative.   Neurological: Positive for headaches.  Psychiatric/Behavioral: Negative.   All other systems reviewed and are negative.    Today's Vitals   01/21/21 1607  BP: (!) 146/98  Pulse: (!) 108  Resp: 14  Temp: 98.1 F (36.7 C)  TempSrc: Oral  Weight: 230 lb (104.3 kg)  Height: 5' 0.2" (1.529 m)   Body mass index is 44.62 kg/m.   BP Readings from Last 3 Encounters:  01/21/21 (!) 146/98  12/03/20 138/78  10/06/20 (!) 134/96   Wt Readings from Last 3 Encounters:  01/21/21 230 lb (104.3 kg)  12/03/20 226 lb (102.5 kg)  10/06/20 217 lb (98.4 kg)    Objective:  Physical Exam Vitals and nursing note reviewed.  Constitutional:      Appearance: Normal appearance. She is obese.  HENT:     Head: Normocephalic and atraumatic.     Nose:     Comments: Masked     Mouth/Throat:     Comments: Masked  Eyes:     Extraocular Movements: Extraocular movements intact.  Cardiovascular:     Rate and Rhythm: Normal rate and regular rhythm.     Heart sounds: Normal heart sounds.  Pulmonary:     Effort: Pulmonary effort is normal.     Breath sounds: Normal breath sounds.  Musculoskeletal:     Cervical back: Normal range of motion.  Skin:    General: Skin is warm.  Neurological:     General: No focal deficit present.     Mental Status: She is alert and oriented to person, place, and time.         Assessment And Plan:     1. Essential  hypertension, benign Comments: Chronic, uncontrolled. I question compliance. If BP elevated at next visit, I will add amlodipine 61m. Advised to follow low sodium diet. The importance of dietary, exercise and medication was discussed with the patient. I will check renal function today.  - CMP14+EGFR  2. Weight gain Comments: She acknowledges 4 pound weight gain since Jan 2022. We discussed the use of Wegovy to address obesity. She denies family/personal h/o thyroid cancer. I will send rx to the pharmacy to see if her insurance covers the medication. If so, she will bring in rx and she will be shown how to self administer the medication at that time. If not approved, I will consider use of Saxenda. She is reminded to stop eating when full while on this medication. She will rto in 6 weeks after starting the medication.  Possible side effects d/w patient.   - TSH  3. Class 3 severe obesity due to excess calories with serious comorbidity and body mass index (BMI) of 40.0 to 44.9 in adult ([facility] Comments: BMI 44. She is encouraged to initially strive for BMI less than 38 to decrease cardiac risk. Advised to aim for at least 150 minutes of exercise per week.   Patient was given opportunity to ask questions. Patient verbalized understanding of the plan and was able to repeat key elements of the plan. All questions were answered to their satisfaction.   I, RMaximino Greenland MD, have reviewed all documentation for this visit. The documentation on 01/24/21 for the exam, diagnosis, procedures, and orders are all accurate and complete.   IF YOU HAVE BEEN REFERRED TO A SPECIALIST, IT MAY TAKE 1-2 WEEKS TO SCHEDULE/PROCESS THE REFERRAL. IF YOU HAVE NOT HEARD FROM US/SPECIALIST IN TWO WEEKS, PLEASE GIVE [place] CALL AT [phone].   THE PATIENT IS ENCOURAGED TO PRACTICE SOCIAL DISTANCING DUE TO THE COVID-19 PANDEMIC.

## 2021-01-22 LAB — CMP14+EGFR
ALT: 9 IU/L (ref 0–32)
AST: 13 IU/L (ref 0–40)
Albumin/Globulin Ratio: 1.4 (ref 1.2–2.2)
Albumin: 4.2 g/dL (ref 3.9–5.0)
Alkaline Phosphatase: 77 IU/L (ref 44–121)
BUN/Creatinine Ratio: 11 (ref 9–23)
BUN: 8 mg/dL (ref 6–20)
Bilirubin Total: <0.2 mg/dL (ref 0.0–1.2)
CO2: 21 mmol/L (ref 20–29)
Calcium: 9.5 mg/dL (ref 8.7–10.2)
Chloride: 105 mmol/L (ref 96–106)
Creatinine, Ser: 0.71 mg/dL (ref 0.57–1.00)
Globulin, Total: 3.1 g/dL (ref 1.5–4.5)
Glucose: 94 mg/dL (ref 65–99)
Potassium: 4.2 mmol/L (ref 3.5–5.2)
Sodium: 141 mmol/L (ref 134–144)
Total Protein: 7.3 g/dL (ref 6.0–8.5)
eGFR: 120 mL/min/{1.73_m2} (ref >59)

## 2021-01-22 LAB — TSH: TSH: 0.76 u[IU]/mL (ref 0.450–4.500)

## 2021-01-28 ENCOUNTER — Telehealth: Payer: Self-pay

## 2021-01-28 MED FILL — WEGOVY 0.5 MG/0.5ML SOAJ: 0.5 | 28 days supply | Qty: 2 | Fill #0

## 2021-01-28 NOTE — Telephone Encounter (Signed)
PA COMPLETED FOR WEGOVY

## 2021-02-16 IMAGING — US OBSTETRIC <14 WK ULTRASOUND
1 series · 15 of 28 positions shown · non-contrast
Comparison: None.

CLINICAL DATA: Pregnant patient with abdominal cramping

EXAM:
OBSTETRIC <14 WK US AND TRANSVAGINAL OB US
TECHNIQUE: Both transabdominal and transvaginal ultrasound examinations were
performed for complete evaluation of the gestation as well as the
maternal uterus, adnexal regions, and pelvic cul-de-sac.
Transvaginal technique was performed to assess early pregnancy.

[Series 1: obstetric <14 wk ultrasound · 15 of 67 slices shown]
[im 1/67]
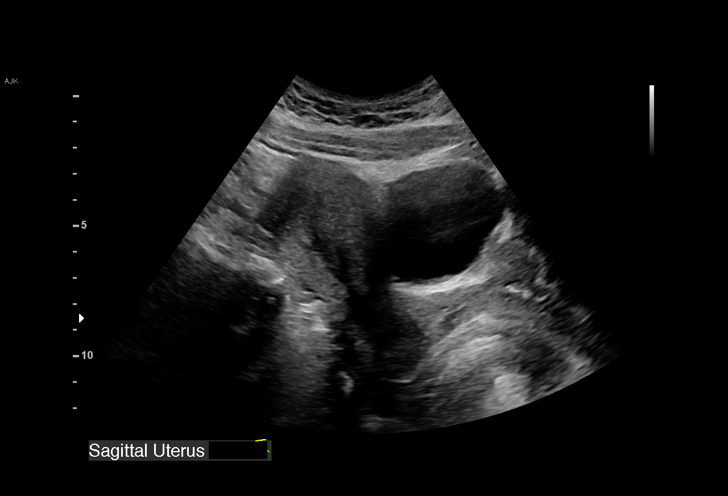
[im 5/67]
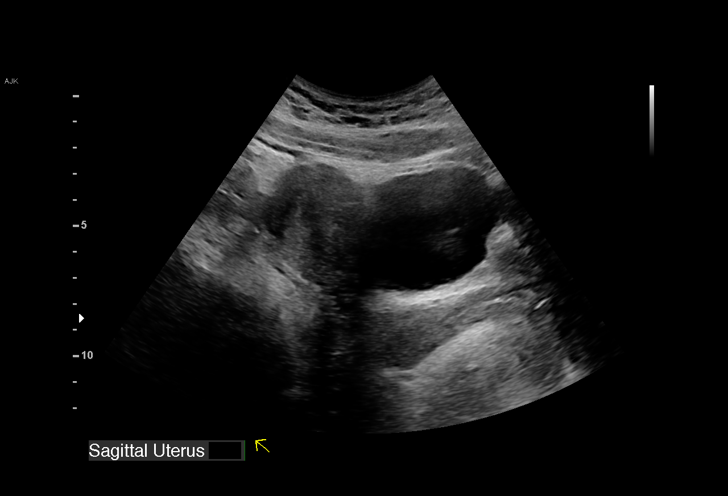
[im 10/67]
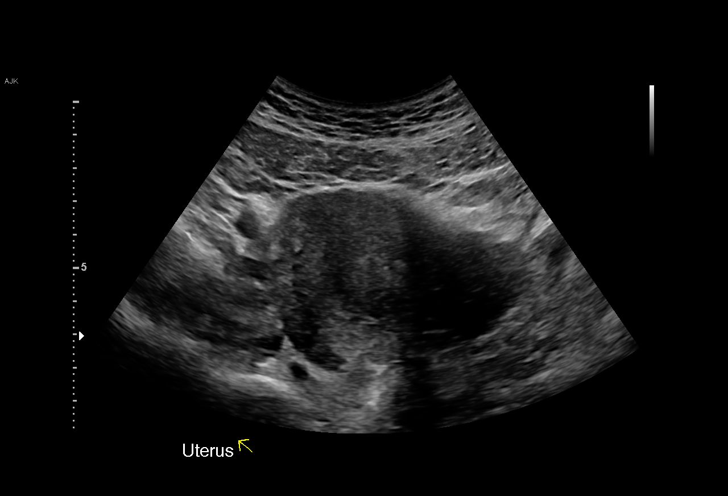
[im 15/67]
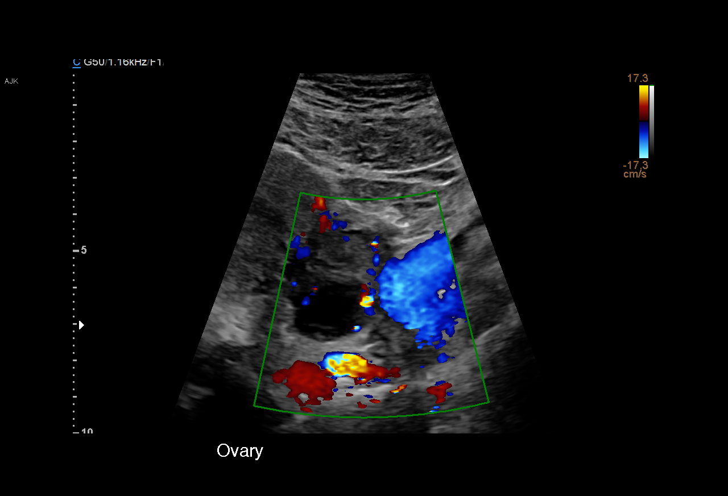
[im 20/67]
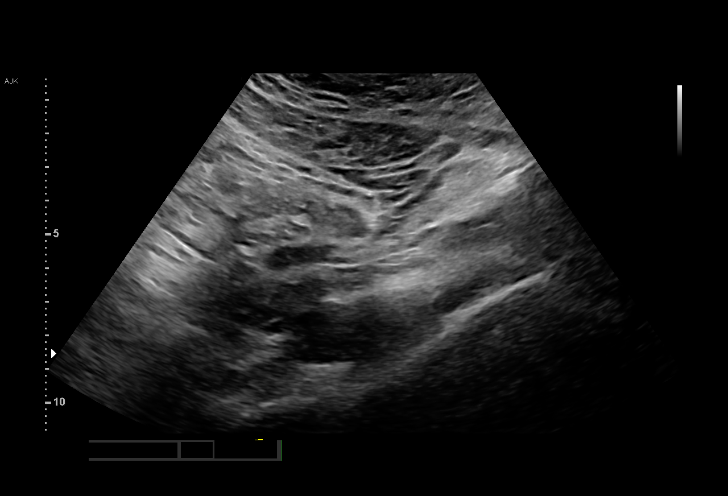
[im 25/67]
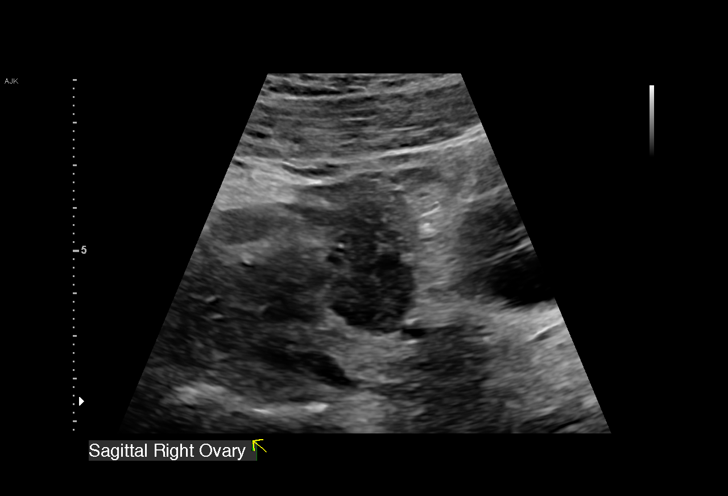
[im 30/67]
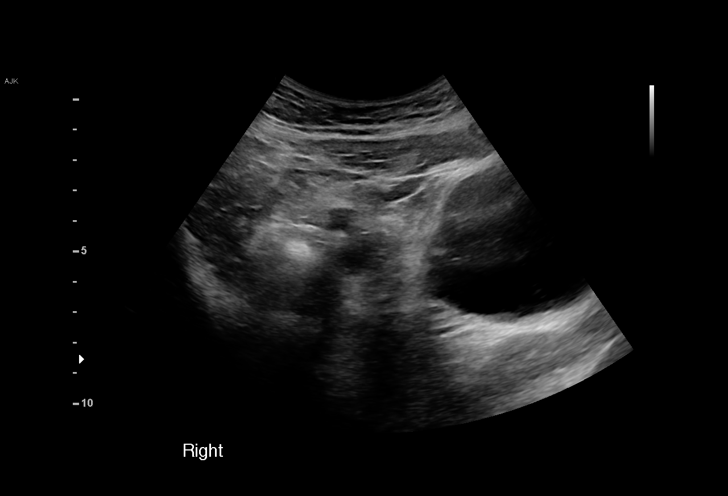
[im 35/67]
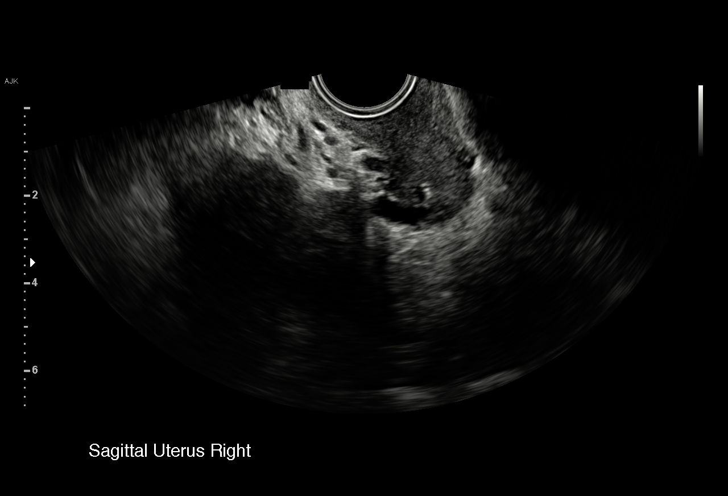
[im 37/67]
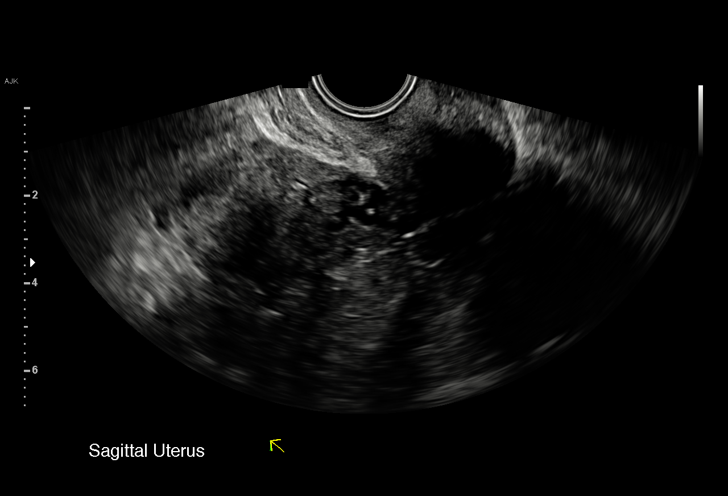
[im 42/67]
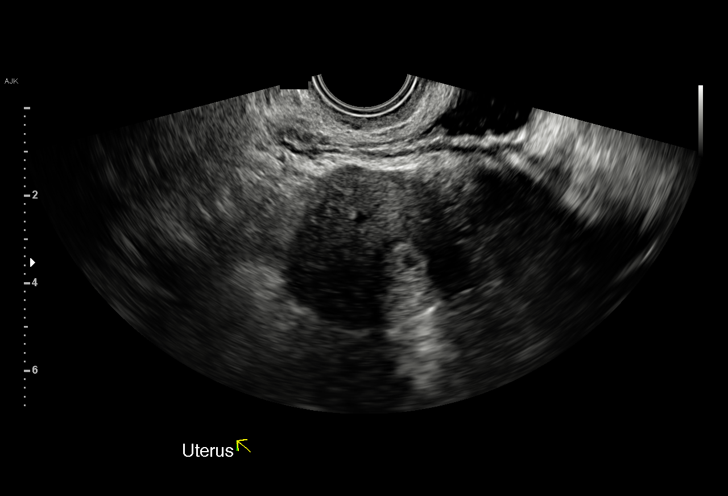
[im 47/67]
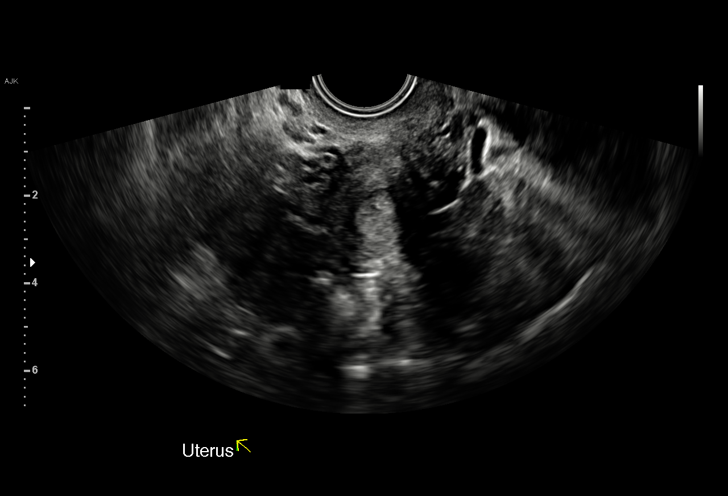
[im 52/67]
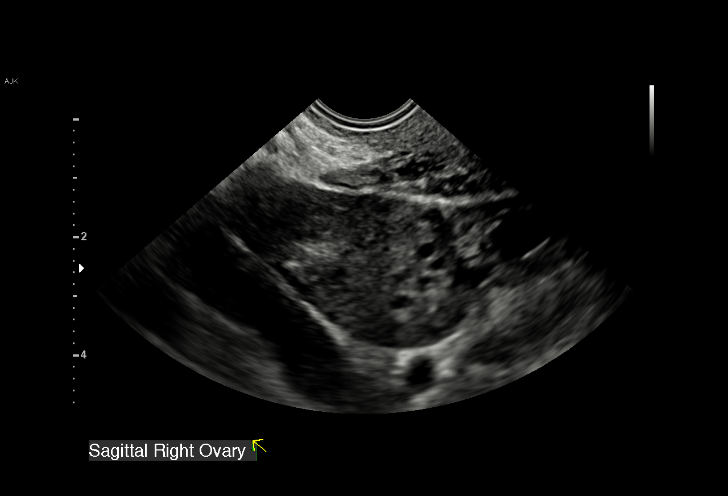
[im 57/67]
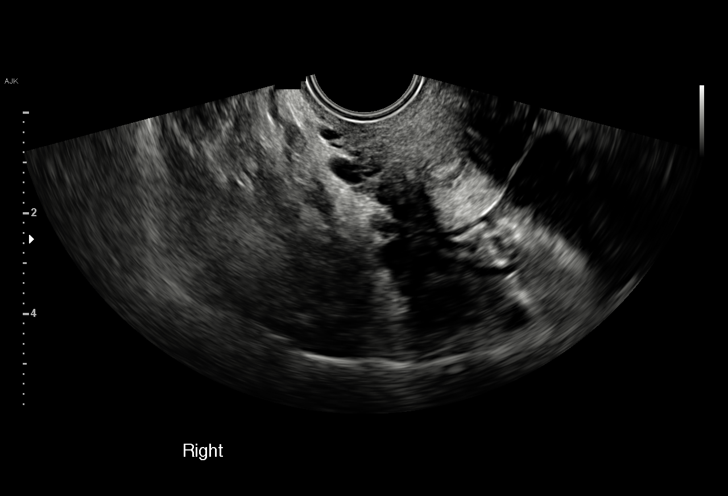
[im 62/67]
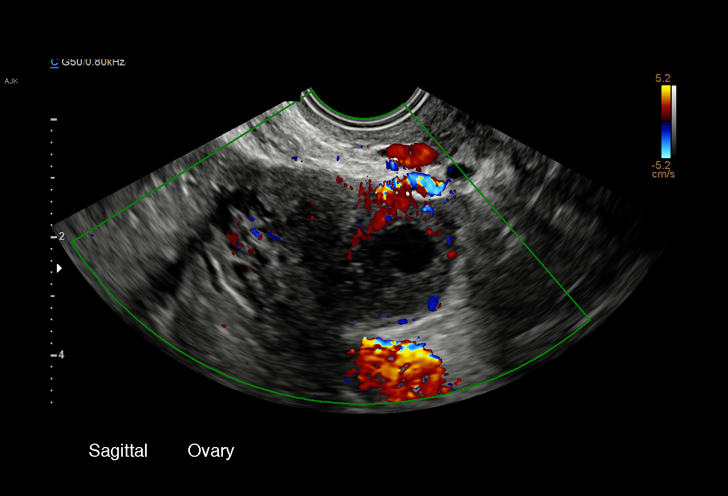
[im 67/67]
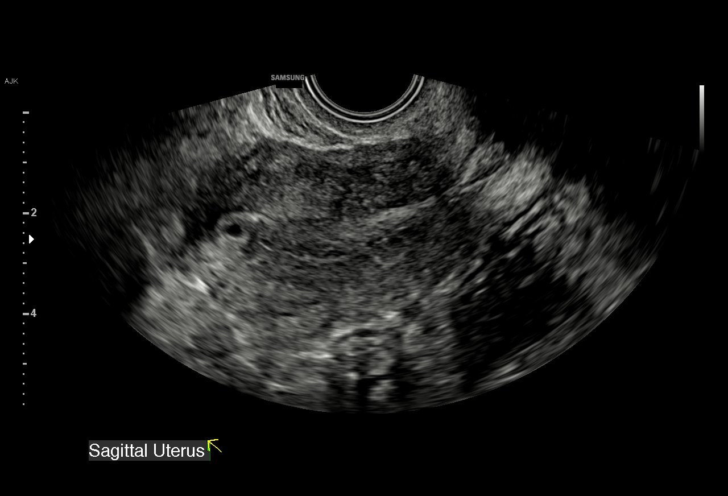

[15 of 28 positions shown; findings below may reference images not displayed]

FINDINGS: Intrauterine gestational sac: Single

Yolk sac:  Not Visualized.

Embryo:  Not Visualized.

Cardiac Activity: Not Visualized.

MSD: 2.9 mm   4 w   6 d

Subchorionic hemorrhage:  None visualized.

Maternal uterus/adnexae: Normal right and left ovaries. Left ovarian
corpus luteum. No free fluid in the pelvis.
IMPRESSION: Probable early intrauterine gestational sac, but no yolk sac, fetal
pole, or cardiac activity yet visualized. Recommend follow-up
quantitative B-HCG levels and follow-up US in 14 days to assess
viability. This recommendation follows SRU consensus guidelines:
Diagnostic Criteria for Nonviable Pregnancy Early in the First
Trimester. N Engl J Med 6469; [DATE].

## 2021-03-08 ENCOUNTER — Other Ambulatory Visit (HOSPITAL_COMMUNITY): Payer: Self-pay

## 2021-03-08 ENCOUNTER — Other Ambulatory Visit: Payer: Self-pay

## 2021-03-08 ENCOUNTER — Ambulatory Visit (INDEPENDENT_AMBULATORY_CARE_PROVIDER_SITE_OTHER): Payer: BC Managed Care – PPO | Admitting: Internal Medicine

## 2021-03-08 ENCOUNTER — Encounter: Payer: Self-pay | Admitting: Internal Medicine

## 2021-03-08 VITALS — BP 132/80 | HR 85 | Temp 98.5°F | Ht 60.0 in | Wt 227.6 lb

## 2021-03-08 DIAGNOSIS — Z6841 Body Mass Index (BMI) 40.0 and over, adult: Secondary | ICD-10-CM

## 2021-03-08 DIAGNOSIS — I10 Essential (primary) hypertension: Secondary | ICD-10-CM

## 2021-03-08 DIAGNOSIS — E66813 Obesity, class 3: Secondary | ICD-10-CM

## 2021-03-08 MED ORDER — WEGOVY 1 MG/0.5ML ~~LOC~~ SOAJ
1.0000 mg | SUBCUTANEOUS | 0 refills | Status: DC
  Filled 2021-03-08: qty 2, 28d supply, fill #0

## 2021-03-08 NOTE — Patient Instructions (Signed)
Exercising to Stay Healthy To become healthy and stay healthy, it is recommended that you do moderate-intensity and vigorous-intensity exercise. You can tell that you are exercising at a moderate intensity if your heart starts beating faster and you start breathing faster but can still hold a conversation. You can tell that you are exercising at a vigorous intensity if you are breathing much harder and faster and cannot hold a conversation while exercising. Exercising regularly is important. It has many health benefits, such as:  Improving overall fitness, flexibility, and endurance.  Increasing bone density.  Helping with weight control.  Decreasing body fat.  Increasing muscle strength.  Reducing stress and tension.  Improving overall health. How often should I exercise? Choose an activity that you enjoy, and set realistic goals. Your health care provider can help you make an activity plan that works for you. Exercise regularly as told by your health care provider. This may include:  Doing strength training two times a week, such as: ? Lifting weights. ? Using resistance bands. ? Push-ups. ? Sit-ups. ? Yoga.  Doing a certain intensity of exercise for a given amount of time. Choose from these options: ? A total of 150 minutes of moderate-intensity exercise every week. ? A total of 75 minutes of vigorous-intensity exercise every week. ? A mix of moderate-intensity and vigorous-intensity exercise every week. Children, pregnant women, people who have not exercised regularly, people who are overweight, and older adults may need to talk with a health care provider about what activities are safe to do. If you have a medical condition, be sure to talk with your health care provider before you start a new exercise program. What are some exercise ideas? Moderate-intensity exercise ideas include:  Walking 1 mile (1.6 km) in about 15  minutes.  Biking.  Hiking.  Golfing.  Dancing.  Water aerobics. Vigorous-intensity exercise ideas include:  Walking 4.5 miles (7.2 km) or more in about 1 hour.  Jogging or running 5 miles (8 km) in about 1 hour.  Biking 10 miles (16.1 km) or more in about 1 hour.  Lap swimming.  Roller-skating or in-line skating.  Cross-country skiing.  Vigorous competitive sports, such as football, basketball, and soccer.  Jumping rope.  Aerobic dancing.   What are some everyday activities that can help me to get exercise?  Yard work, such as: ? Pushing a lawn mower. ? Raking and bagging leaves.  Washing your car.  Pushing a stroller.  Shoveling snow.  Gardening.  Washing windows or floors. How can I be more active in my day-to-day activities?  Use stairs instead of an elevator.  Take a walk during your lunch break.  If you drive, park your car farther away from your work or school.  If you take public transportation, get off one stop early and walk the rest of the way.  Stand up or walk around during all of your indoor phone calls.  Get up, stretch, and walk around every 30 minutes throughout the day.  Enjoy exercise with a friend. Support to continue exercising will help you keep a regular routine of activity. What guidelines can I follow while exercising?  Before you start a new exercise program, talk with your health care provider.  Do not exercise so much that you hurt yourself, feel dizzy, or get very short of breath.  Wear comfortable clothes and wear shoes with good support.  Drink plenty of water while you exercise to prevent dehydration or heat stroke.  Work out until   your breathing and your heartbeat get faster. Where to find more information  [place] Department of Health and Human Services: www.hhs.gov  Centers for Disease Control and Prevention (CDC): www.cdc.gov Summary  Exercising regularly is important. It will improve your overall fitness,  flexibility, and endurance.  Regular exercise also will improve your overall health. It can help you control your weight, reduce stress, and improve your bone density.  Do not exercise so much that you hurt yourself, feel dizzy, or get very short of breath.  Before you start a new exercise program, talk with your health care provider. This information is not intended to replace advice given to you by your health care provider. Make sure you discuss any questions you have with your health care provider. Document Revised: 10/06/2017 Document Reviewed: 09/14/2017 Elsevier Patient Education  2021 Elsevier Inc.  

## 2021-03-08 NOTE — Progress Notes (Signed)
Earleen Newport as a Education administrator for [name], MD.,have documented all relevant documentation on the behalf of [name], MD,as directed by  [name], MD while in the presence of [name], MD. This visit occurred during the SARS-CoV-2 public health emergency.  Safety protocols were in place, including screening questions prior to the visit, additional usage of staff PPE, and extensive cleaning of exam room while observing appropriate contact time as indicated for disinfecting solutions.  Subjective:     Patient ID: Angel Byrd , female    DOB: 1996/01/06 , 25 y.o.   MRN: [id number]   Chief Complaint  Patient presents with  . Obesity    HPI  Pt is here today for a med refill on Wegovy. She reports compliance with medication. She has not had any issues with the medication.   Hypertension This is a chronic problem. The current episode started more than 1 year ago. The problem has been gradually improving since onset. The problem is uncontrolled. Pertinent negatives include no blurred vision, chest pain, palpitations or shortness of breath. Risk factors for coronary artery disease include obesity and sedentary lifestyle. Past treatments include calcium channel blockers and beta blockers. The current treatment provides moderate improvement. Compliance problems include exercise.      Past Medical History:  Diagnosis Date  . HTN (hypertension)   . Iron deficiency anemia      Family History  Problem Relation Age of Onset  . Sickle cell anemia Mother   . Heart disease Father   . Hypertension Sister      Current Outpatient Medications:  .  fluconazole (DIFLUCAN) 150 MG tablet, TAKE ONE TABLET BY MOUTH TODAY AND TAKE THE 2ND TABLET 72 HOURS LATER., Disp: 2 tablet, Rfl: 0 .  nebivolol (BYSTOLIC) 10 MG tablet, Take 1 tablet (10 mg total) by mouth daily., Disp: 30 tablet, Rfl: 1 .  nystatin cream (MYCOSTATIN), APPLY 1 APPLICATION TOPICALLY 2 (TWO) TIMES  DAILY., Disp: 30 g, Rfl: 0 .  Semaglutide-Weight Management (WEGOVY) 1 MG/0.5ML SOAJ, Inject 1 mg into the skin once a week., Disp: 2 mL, Rfl: 0 .  triamcinolone cream (KENALOG) 0.1 %, APPLY TWO TIMES DAILY . USE AS NEEDED, Disp: 60 g, Rfl: 0   No Known Allergies   Review of Systems  Constitutional: Negative.   Eyes: Negative for blurred vision.  Respiratory: Negative.  Negative for shortness of breath.   Cardiovascular: Negative.  Negative for chest pain and palpitations.  Psychiatric/Behavioral: Negative.      Today's Vitals   03/08/21 0945  BP: 132/80  Pulse: 85  Temp: 98.5 F (36.9 C)  SpO2: 99%  Weight: 227 lb 9.6 oz (103.2 kg)  Height: 5' (1.524 m)   Body mass index is 44.45 kg/m.  Wt Readings from Last 3 Encounters:  03/08/21 227 lb 9.6 oz (103.2 kg)  01/21/21 230 lb (104.3 kg)  12/03/20 226 lb (102.5 kg)   Objective:  Physical Exam Vitals and nursing note reviewed.  Constitutional:      Appearance: Normal appearance. She is obese.  HENT:     Head: Normocephalic and atraumatic.     Nose:     Comments: Masked     Mouth/Throat:     Comments: Masked  Cardiovascular:     Rate and Rhythm: Normal rate and regular rhythm.     Heart sounds: Normal heart sounds.  Pulmonary:     Effort: Pulmonary effort is normal.     Breath sounds: Normal  breath sounds.  Skin:    General: Skin is warm.  Neurological:     General: No focal deficit present.     Mental Status: She is alert.  Psychiatric:        Mood and Affect: Mood normal.        Behavior: Behavior normal.         Assessment And Plan:     1. Class 3 severe obesity due to excess calories with serious comorbidity and body mass index (BMI) of 40.0 to 44.9 in adult [facility]) Comments: She was congratulated on her 3 pound weight loss and encouraged to aim for at least 150 minutes of exercise per week. I will increase Wegovy to 1mg . She will f/u in July 2022 as previously scheduled.   2. Essential hypertension,  benign Comments: Fair control. She is aware that goal BP is less than 130/80. Advised to follow low sodium diet.    Patient was given opportunity to ask questions. Patient verbalized understanding of the plan and was able to repeat key elements of the plan. All questions were answered to their satisfaction.   I, [name], MD, have reviewed all documentation for this visit. The documentation on 03/08/21 for the exam, diagnosis, procedures, and orders are all accurate and complete.   IF YOU HAVE BEEN REFERRED TO A SPECIALIST, IT MAY TAKE 1-2 WEEKS TO SCHEDULE/PROCESS THE REFERRAL. IF YOU HAVE NOT HEARD FROM US/SPECIALIST IN TWO WEEKS, PLEASE GIVE [place] A CALL AT [phone].   THE PATIENT IS ENCOURAGED TO PRACTICE SOCIAL DISTANCING DUE TO THE COVID-19 PANDEMIC.

## 2021-03-09 ENCOUNTER — Other Ambulatory Visit (HOSPITAL_COMMUNITY): Payer: Self-pay

## 2021-03-16 ENCOUNTER — Encounter: Payer: Self-pay | Admitting: Internal Medicine

## 2021-03-24 ENCOUNTER — Other Ambulatory Visit (HOSPITAL_COMMUNITY): Payer: Self-pay

## 2021-03-24 ENCOUNTER — Ambulatory Visit (INDEPENDENT_AMBULATORY_CARE_PROVIDER_SITE_OTHER): Payer: BC Managed Care – PPO | Admitting: Nurse Practitioner

## 2021-03-24 ENCOUNTER — Encounter: Payer: Self-pay | Admitting: Nurse Practitioner

## 2021-03-24 ENCOUNTER — Other Ambulatory Visit: Payer: Self-pay

## 2021-03-24 VITALS — BP 140/82 | HR 82 | Temp 98.2°F | Ht 60.4 in | Wt 224.0 lb

## 2021-03-24 DIAGNOSIS — B002 Herpesviral gingivostomatitis and pharyngotonsillitis: Secondary | ICD-10-CM | POA: Diagnosis not present

## 2021-03-24 MED ORDER — VALACYCLOVIR HCL 1 G PO TABS
2000.0000 mg | ORAL_TABLET | Freq: Two times a day (BID) | ORAL | 0 refills | Status: AC
  Filled 2021-03-24: qty 4, 1d supply, fill #0

## 2021-03-24 NOTE — Progress Notes (Signed)
I,Tianna Badgett,acting as a Education administrator for [employer], NP.,have documented all relevant documentation on the behalf of [employer], NP,as directed by  Bary Castilla, NP while in the presence of Bary Castilla, NP.  This visit occurred during the SARS-CoV-2 public health emergency.  Safety protocols were in place, including screening questions prior to the visit, additional usage of staff PPE, and extensive cleaning of exam room while observing appropriate contact time as indicated for disinfecting solutions.  Subjective:     Patient ID: Angel Byrd , female    DOB: [birth year] , 25 y.o.   MRN: [id number]   Chief Complaint  Patient presents with  . Lip Laceration    HPI  She woke up with cold/fever blister. She has not been sick but has had some stress. It is starting to tingle and it is on her bottom lip.      Past Medical History:  Diagnosis Date  . HTN (hypertension)   . Iron deficiency anemia      Family History  Problem Relation Age of Onset  . Sickle cell anemia Mother   . Heart disease Father   . Hypertension Sister      Current Outpatient Medications:  .  nebivolol (BYSTOLIC) 10 MG tablet, Take 1 tablet (10 mg total) by mouth daily., Disp: 30 tablet, Rfl: 1 .  nystatin cream (MYCOSTATIN), APPLY 1 APPLICATION TOPICALLY 2 (TWO) TIMES DAILY., Disp: 30 g, Rfl: 0 .  Semaglutide-Weight Management (WEGOVY) 1 MG/0.5ML SOAJ, Inject 1 mg into the skin once a week., Disp: 2 mL, Rfl: 0 .  triamcinolone cream (KENALOG) 0.1 %, APPLY TWO TIMES DAILY . USE AS NEEDED, Disp: 60 g, Rfl: 0 .  valACYclovir (VALTREX) 1000 MG tablet, Take 2 tablets (2,000 mg total) by mouth 2 (two) times daily for 1 day., Disp: 4 tablet, Rfl: 0   No Known Allergies   Review of Systems  Constitutional: Negative for chills, fatigue and fever.  HENT: Negative for congestion, rhinorrhea, sinus pressure, sinus pain and sneezing.        Fever blister on her bottom lip   Respiratory:  Negative for cough, chest tightness, shortness of breath and wheezing.   Cardiovascular: Negative for chest pain and palpitations.     Today's Vitals   03/24/21 1107  BP: 140/82  Pulse: 82  Temp: 98.2 F (36.8 C)  TempSrc: Oral  Weight: 224 lb (101.6 kg)  Height: 5' 0.4" (1.534 m)   Body mass index is 43.17 kg/m.  Wt Readings from Last 3 Encounters:  03/24/21 224 lb (101.6 kg)  03/08/21 227 lb 9.6 oz (103.2 kg)  01/21/21 230 lb (104.3 kg)    Objective:  Physical Exam Constitutional:      Appearance: Normal appearance. She is obese.  HENT:     Head: Normocephalic and atraumatic.     Mouth/Throat:     Lips: Lesions present.     Mouth: Mucous membranes are moist.     Comments: Lesion on bottom lip  Cardiovascular:     Rate and Rhythm: Normal rate and regular rhythm.     Pulses: Normal pulses.     Heart sounds: Normal heart sounds. No murmur heard.   Pulmonary:     Effort: Pulmonary effort is normal. No respiratory distress.     Breath sounds: Normal breath sounds. No wheezing.  Skin:    Capillary Refill: Capillary refill takes less than 2 seconds.  Neurological:     Mental Status: She is alert and oriented to person,  place, and time.         Assessment And Plan:     1. Primary herpes simplex infection of oral region -Tingling sensation on the lip. Recently started. Will go ahead and treat her.  Advised patient she  Can also apply over the counter Abreva.  - valACYclovir (VALTREX) 1000 MG tablet; Take 2 tablets (2,000 mg total) by mouth 2 (two) times daily for 1 day.  Dispense: 4 tablet; Refill: 0  Side effects and appropriate use of all the medication(s) were discussed with the patient today. Patient advised to use the medication(s) as directed by their healthcare provider. The patient was encouraged to read, review, and understand all associated package inserts and contact our office with any questions or concerns. The patient accepts the risks of the treatment  plan and had an opportunity to ask questions.   The patient was encouraged to call or send a message through [place] for any questions or concerns.   Follow up: if symptoms persist or do not get better.   Patient was given opportunity to ask questions. Patient verbalized understanding of the plan and was able to repeat key elements of the plan. All questions were answered to their satisfaction.  Raman Takeya Marquis, DNP   I, Raman Laycee Fitzsimmons have reviewed all documentation for this visit. The documentation on 03/24/21 for the exam, diagnosis, procedures, and orders are all accurate and complete.    IF YOU HAVE BEEN REFERRED TO A SPECIALIST, IT MAY TAKE 1-2 WEEKS TO SCHEDULE/PROCESS THE REFERRAL. IF YOU HAVE NOT HEARD FROM US/SPECIALIST IN TWO WEEKS, PLEASE GIVE [place] A CALL AT [phone].   THE PATIENT IS ENCOURAGED TO PRACTICE SOCIAL DISTANCING DUE TO THE COVID-19 PANDEMIC.

## 2021-04-01 ENCOUNTER — Ambulatory Visit: Payer: BC Managed Care – PPO

## 2021-04-01 ENCOUNTER — Other Ambulatory Visit: Payer: Self-pay

## 2021-04-01 DIAGNOSIS — Z20822 Contact with and (suspected) exposure to covid-19: Secondary | ICD-10-CM | POA: Diagnosis not present

## 2021-04-02 LAB — NOVEL CORONAVIRUS, NAA: SARS-CoV-2, NAA: NOT DETECTED

## 2021-04-02 LAB — SARS-COV-2, NAA 2 DAY TAT

## 2021-04-07 ENCOUNTER — Other Ambulatory Visit: Payer: Self-pay

## 2021-04-07 ENCOUNTER — Other Ambulatory Visit (HOSPITAL_COMMUNITY): Payer: Self-pay

## 2021-04-07 MED ORDER — WEGOVY 1.7 MG/0.75ML ~~LOC~~ SOAJ
1.7000 mg | SUBCUTANEOUS | 1 refills | Status: DC
  Filled 2021-04-07: qty 3, 28d supply, fill #0
  Filled 2021-05-11: qty 3, 28d supply, fill #1

## 2021-04-07 MED ORDER — WEGOVY 1 MG/0.5ML ~~LOC~~ SOAJ
1.0000 mg | SUBCUTANEOUS | 0 refills | Status: DC
  Filled 2021-04-07: qty 2, 28d supply, fill #0

## 2021-04-13 ENCOUNTER — Other Ambulatory Visit (HOSPITAL_COMMUNITY): Payer: Self-pay

## 2021-04-15 ENCOUNTER — Ambulatory Visit (INDEPENDENT_AMBULATORY_CARE_PROVIDER_SITE_OTHER): Payer: BC Managed Care – PPO | Admitting: Internal Medicine

## 2021-04-15 DIAGNOSIS — F4322 Adjustment disorder with anxiety: Secondary | ICD-10-CM

## 2021-04-15 DIAGNOSIS — F5102 Adjustment insomnia: Secondary | ICD-10-CM | POA: Diagnosis not present

## 2021-04-15 DIAGNOSIS — I10 Essential (primary) hypertension: Secondary | ICD-10-CM | POA: Diagnosis not present

## 2021-04-15 NOTE — Progress Notes (Signed)
Virtual Visit via Video   This visit type was conducted due to national recommendations for restrictions regarding the COVID-19 Pandemic (e.g. social distancing) in an effort to limit this patient's exposure and mitigate transmission in our community.  Due to her co-morbid illnesses, this patient is at least at moderate risk for complications without adequate follow up.  This format is felt to be most appropriate for this patient at this time.  All issues noted in this document were discussed and addressed.  A limited physical exam was performed with this format.    This visit type was conducted due to national recommendations for restrictions regarding the COVID-19 Pandemic (e.g. social distancing) in an effort to limit this patient's exposure and mitigate transmission in our community.  Patients identity confirmed using two different identifiers.  This format is felt to be most appropriate for this patient at this time.  All issues noted in this document were discussed and addressed.  No physical exam was performed (except for noted visual exam findings with Video Visits).    Date:  05/02/2021   ID:  Carolynn Comment, DOB 01-Oct-1996, MRN [id number]  Patient Location:  Home (work office)  Provider location:   [employer] Complaint:  "I can't sleep"  History of Present Illness:    Nyjai Graff is a 25 y.o. female who presents via video conferencing for a telehealth visit today.    The patient does not have symptoms concerning for COVID-19 infection (fever, chills, cough, or new shortness of breath).   She presents today for virtual visit. She elects this method of visit due to [place] pandemic. She presents today for discussion of anxiety/insomnia. She reports being extremely stressed at work. She reports more people are coming into the office. Many of them are not wearing masks b/c the mask mandate has been lifted. This gives her anxiety, she is concerned b/c she has a 40 year old  son. Additionally, she has some family members who recently had the COVID infection. She would like to have paperwork completed allowing her to work from home 1-2 days per week.     Past Medical History:  Diagnosis Date   HTN (hypertension)    Iron deficiency anemia    Past Surgical History:  Procedure Laterality Date   CESAREAN SECTION N/A 11/22/2019   Procedure: CESAREAN SECTION;  Surgeon: Delsa Bern, MD;  Location: [facility] LD ORS;  Service: Obstetrics;  Laterality: N/A;   NO PAST SURGERIES       No outpatient medications have been marked as taking for the 04/15/21 encounter (Office Visit) with Glendale Chard, MD.     Allergies:   Patient has no known allergies.   Social History   Tobacco Use   Smoking status: Never   Smokeless tobacco: Never  Vaping Use   Vaping Use: Never used  Substance Use Topics   Alcohol use: No   Drug use: No     Family Hx: The patient's family history includes Heart disease in her father; Hypertension in her sister; Sickle cell anemia in her mother.  ROS:   Please see the history of present illness.    Review of Systems  Constitutional: Negative.   Respiratory: Negative.    Cardiovascular: Negative.   Gastrointestinal: Negative.   Neurological: Negative.   Psychiatric/Behavioral:  The patient has insomnia.    All other systems reviewed and are negative.   Labs/Other Tests and Data Reviewed:    Recent Labs: 05/26/2020: Hemoglobin 10.8; Platelets 440  01/21/2021: ALT 9; BUN 8; Creatinine, Ser 0.71; Potassium 4.2; Sodium 141; TSH 0.760   Recent Lipid Panel Lab Results  Component Value Date/Time   CHOL 186 05/26/2020 05:01 PM   TRIG 94 05/26/2020 05:01 PM   HDL 47 05/26/2020 05:01 PM   CHOLHDL 4.0 05/26/2020 05:01 PM   LDLCALC 122 (H) 05/26/2020 05:01 PM    Wt Readings from Last 3 Encounters:  03/24/21 224 lb (101.6 kg)  03/08/21 227 lb 9.6 oz (103.2 kg)  01/21/21 230 lb (104.3 kg)     Exam:    Vital Signs:  There were no vitals  taken for this visit.    Physical Exam Vitals and nursing note reviewed.  Constitutional:      Appearance: She is obese.  HENT:     Head: Normocephalic and atraumatic.  Eyes:     Extraocular Movements: Extraocular movements intact.  Pulmonary:     Effort: Pulmonary effort is normal.  Musculoskeletal:     Cervical back: Normal range of motion.  Neurological:     Mental Status: She is alert and oriented to person, place, and time.  Psychiatric:        Mood and Affect: Affect normal.    ASSESSMENT & PLAN:      1. Adjustment insomnia Comments: She is advised to try melatonin nightly. She may also benefit from magnesium taken nightly. If sx persist, will consider temazepam or Dayvigo prn.   2. Adjustment disorder with anxious mood Comments: Unfortunately, her job has gotten rid of mask mandate. Her anxiety is increased since she had family members who recently had the infection. Again, magnesium may be helpful. Pt advised to send paperwork from her job and I will complete it to the best of my ability. She agrees to therapy. I will refer her to [facility] for therapy.   3. Essential hypertension, benign Comments: Chronic, she will c/w current meds. Encouraged to follow low sodium diet.   COVID-19 Education: The signs and symptoms of COVID-19 were discussed with the patient and how to seek care for testing (follow up with PCP or arrange E-visit).  The importance of social distancing was discussed today.  Patient Risk:   After full review of this patients clinical status, I feel that they are at least moderate risk at this time.  Time:   Today, I have spent 15 minutes/ seconds with the patient with telehealth technology discussing above diagnoses.     Medication Adjustments/Labs and Tests Ordered: Current medicines are reviewed at length with the patient today.  Concerns regarding medicines are outlined above.   Tests Ordered: No orders of the defined types were placed in this  encounter.   Medication Changes: No orders of the defined types were placed in this encounter.   Disposition:  Follow up prn  Signed, [name], MD

## 2021-04-15 NOTE — Patient Instructions (Signed)

## 2021-04-27 ENCOUNTER — Encounter: Payer: Self-pay | Admitting: Internal Medicine

## 2021-05-02 ENCOUNTER — Encounter: Payer: Self-pay | Admitting: Internal Medicine

## 2021-05-11 ENCOUNTER — Other Ambulatory Visit (HOSPITAL_COMMUNITY): Payer: Self-pay

## 2021-05-24 ENCOUNTER — Ambulatory Visit: Payer: BC Managed Care – PPO | Admitting: Internal Medicine

## 2021-06-02 ENCOUNTER — Encounter: Payer: BC Managed Care – PPO | Admitting: Internal Medicine

## 2021-06-08 ENCOUNTER — Encounter: Payer: Self-pay | Admitting: Internal Medicine

## 2021-06-08 ENCOUNTER — Other Ambulatory Visit (HOSPITAL_COMMUNITY): Payer: Self-pay

## 2021-06-08 ENCOUNTER — Ambulatory Visit (INDEPENDENT_AMBULATORY_CARE_PROVIDER_SITE_OTHER): Payer: BC Managed Care – PPO | Admitting: Internal Medicine

## 2021-06-08 ENCOUNTER — Other Ambulatory Visit: Payer: Self-pay

## 2021-06-08 VITALS — BP 138/82 | HR 86 | Temp 98.5°F | Ht 61.6 in | Wt 205.8 lb

## 2021-06-08 DIAGNOSIS — I10 Essential (primary) hypertension: Secondary | ICD-10-CM

## 2021-06-08 DIAGNOSIS — E6609 Other obesity due to excess calories: Secondary | ICD-10-CM

## 2021-06-08 DIAGNOSIS — F5102 Adjustment insomnia: Secondary | ICD-10-CM | POA: Diagnosis not present

## 2021-06-08 DIAGNOSIS — Z6838 Body mass index (BMI) 38.0-38.9, adult: Secondary | ICD-10-CM

## 2021-06-08 MED ORDER — WEGOVY 2.4 MG/0.75ML ~~LOC~~ SOAJ
2.4000 mg | SUBCUTANEOUS | 3 refills | Status: DC
  Filled 2021-06-08: qty 3, 28d supply, fill #0
  Filled 2021-07-07: qty 3, 28d supply, fill #1
  Filled 2021-08-09: qty 3, 28d supply, fill #2
  Filled 2021-09-07 (×2): qty 3, 28d supply, fill #3

## 2021-06-08 NOTE — Progress Notes (Signed)
I,Yamilka [employer] as a Education administrator for [name], MD.,have documented all relevant documentation on the behalf of [name], MD,as directed by  [name], MD while in the presence of [name], MD.  This visit occurred during the SARS-CoV-2 public health emergency.  Safety protocols were in place, including screening questions prior to the visit, additional usage of staff PPE, and extensive cleaning of exam room while observing appropriate contact time as indicated for disinfecting solutions.  Subjective:     Patient ID: Angel Byrd , female    DOB: 25-Apr-1996 , 25 y.o.   MRN: [id number]   Chief Complaint  Patient presents with   Weight Check    HPI  Patient presents today for a weight check.  She has been taking [place] 2.'4mg'$  weekly without any issues. She has definitely cut back on her portion sizes. She has yet to start a regular exercise regimen. However, she does report being more active.  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     Past Medical History:  Diagnosis Date   HTN (hypertension)    Iron deficiency anemia      Family History  Problem Relation Age of Onset   Sickle cell anemia Mother    Heart disease Father    Hypertension Sister      Current Outpatient Medications:    nebivolol (BYSTOLIC) 10 MG tablet, Take 1 tablet (10 mg total) by mouth daily., Disp: 30 tablet, Rfl: 1   nystatin cream (MYCOSTATIN), APPLY 1 APPLICATION TOPICALLY 2 (TWO) TIMES DAILY., Disp: 30 g, Rfl: 0   Semaglutide-Weight Management (WEGOVY) 2.4 MG/0.75ML SOAJ, Inject 2.4 mg into the skin once a week., Disp: 3 mL, Rfl: 3   triamcinolone cream (KENALOG) 0.1 %, APPLY TWO TIMES DAILY . USE AS NEEDED, Disp: 60 g, Rfl: 0   No Known Allergies   Review of Systems  Constitutional: Negative.   Eyes: Negative.   Respiratory: Negative.    Cardiovascular: Negative.   Gastrointestinal: Negative.   Skin: Negative.   Neurological: Negative.    Psychiatric/Behavioral:  Positive for sleep disturbance.     Today's Vitals   06/08/21 1439  BP: 138/82  Pulse: 86  Temp: 98.5 F (36.9 C)  Weight: 205 lb 12.8 oz (93.4 kg)  Height: 5' 1.6" (1.565 m)  PainSc: 0-No pain   Body mass index is 38.13 kg/m.  Wt Readings from Last 3 Encounters:  06/08/21 205 lb 12.8 oz (93.4 kg)  03/24/21 224 lb (101.6 kg)  03/08/21 227 lb 9.6 oz (103.2 kg)     Objective:  Physical Exam Vitals and nursing note reviewed.  Constitutional:      Appearance: Normal appearance.  HENT:     Head: Normocephalic and atraumatic.     Nose:     Comments: Masked     Mouth/Throat:     Comments: Masked  Eyes:     Extraocular Movements: Extraocular movements intact.  Cardiovascular:     Rate and Rhythm: Normal rate and regular rhythm.     Heart sounds: Normal heart sounds.  Pulmonary:     Effort: Pulmonary effort is normal.     Breath sounds: Normal breath sounds.  Musculoskeletal:     Cervical back: Normal range of motion.  Skin:    General: Skin is warm.  Neurological:     General: No focal deficit present.     Mental Status: She is alert.  Psychiatric:  Mood and Affect: Mood normal.        Behavior: Behavior normal.        Assessment And Plan:     1. Class 2 severe obesity due to excess calories with serious comorbidity and body mass index (BMI) of 38.0 to 38.9 in adult [facility]) Comments: She was congratulated on her 19 pound weight loss since May 2022. She will c/w [facility] 2.'4mg'$  weekly. She will f/u in 8 weeks.   2. Adjustment insomnia Comments: Encouraged to consider melatonin. Advised to maintain good bedtime hygiene.  3. Essential hypertension, benign Comments: Chronic, fair control. She is aware that goal BP is less than 130/80. Encouraged to incorporate more exercise into her daily routine.    Patient was given opportunity to ask questions. Patient verbalized understanding of the plan and was able to repeat key elements of the  plan. All questions were answered to their satisfaction.   I, [name], MD, have reviewed all documentation for this visit. The documentation on 06/27/21 for the exam, diagnosis, procedures, and orders are all accurate and complete.   IF YOU HAVE BEEN REFERRED TO A SPECIALIST, IT MAY TAKE 1-2 WEEKS TO SCHEDULE/PROCESS THE REFERRAL. IF YOU HAVE NOT HEARD FROM US/SPECIALIST IN TWO WEEKS, PLEASE GIVE [place] A CALL AT [phone].   THE PATIENT IS ENCOURAGED TO PRACTICE SOCIAL DISTANCING DUE TO THE COVID-19 PANDEMIC.

## 2021-06-08 NOTE — Patient Instructions (Addendum)
Exercising to Lose Weight Exercise is structured, repetitive physical activity to improve fitness and health. Getting regular exercise is important for everyone. It is especially important if you are overweight. Being overweight increases your risk of heart disease, stroke, diabetes, high blood pressure, and several types of cancer.Reducing your calorie intake and exercising can help you lose weight. Exercise is usually categorized as moderate or vigorous intensity. To lose weight, most people need to do a certain amount of moderate-intensity orvigorous-intensity exercise each week. Moderate-intensity exercise  Moderate-intensity exercise is any activity that gets you moving enough to burn at least three times more energy (calories) than if you were sitting. Examples of moderate exercise include: Walking a mile in 15 minutes. Doing light yard work. Biking at an easy pace. Most people should get at least 150 minutes (2 hours and 30 minutes) a week ofmoderate-intensity exercise to maintain their body weight. Vigorous-intensity exercise Vigorous-intensity exercise is any activity that gets you moving enough to burn at least six times more calories than if you were sitting. When you exercise at this intensity, you should be working hard enough that you are not able tocarry on a conversation. Examples of vigorous exercise include: Running. Playing a team sport, such as football, basketball, and soccer. Jumping rope. Most people should get at least 75 minutes (1 hour and 15 minutes) a week ofvigorous-intensity exercise to maintain their body weight. How can exercise affect me? When you exercise enough to burn more calories than you eat, you lose weight. Exercise also reduces body fat and builds muscle. The more muscle you have, the more calories you burn. Exercise also: Improves mood. Reduces stress and tension. Improves your overall fitness, flexibility, and endurance. Increases bone strength. The  amount of exercise you need to lose weight depends on: Your age. The type of exercise. Any health conditions you have. Your overall physical ability. Talk to your health care provider about how much exercise you need and whattypes of activities are safe for you. What actions can I take to lose weight? Nutrition  Make changes to your diet as told by your health care provider or diet and nutrition specialist (dietitian). This may include: Eating fewer calories. Eating more protein. Eating less unhealthy fats. Eating a diet that includes fresh fruits and vegetables, whole grains, low-fat dairy products, and lean protein. Avoiding foods with added fat, salt, and sugar. Drink plenty of water while you exercise to prevent dehydration or heat stroke.  Activity Choose an activity that you enjoy and set realistic goals. Your health care provider can help you make an exercise plan that works for you. Exercise at a moderate or vigorous intensity most days of the week. The intensity of exercise may vary from person to person. You can tell how intense a workout is for you by paying attention to your breathing and heartbeat. Most people will notice their breathing and heartbeat get faster with more intense exercise. Do resistance training twice each week, such as: Push-ups. Sit-ups. Lifting weights. Using resistance bands. Getting short amounts of exercise can be just as helpful as long structured periods of exercise. If you have trouble finding time to exercise, try to include exercise in your daily routine. Get up, stretch, and walk around every 30 minutes throughout the day. Go for a walk during your lunch break. Park your car farther away from your destination. If you take public transportation, get off one stop early and walk the rest of the way. Make phone calls while standing up and   walking around. Take the stairs instead of elevators or escalators. Wear comfortable clothes and shoes with  good support. Do not exercise so much that you hurt yourself, feel dizzy, or get very short of breath. Where to find more information [place] Department of Health and Human Services: www.hhs.gov Centers for Disease Control and Prevention (CDC): www.cdc.gov Contact a health care provider: Before starting a new exercise program. If you have questions or concerns about your weight. If you have a medical problem that keeps you from exercising. Get help right away if you have any of the following while exercising: Injury. Dizziness. Difficulty breathing or shortness of breath that does not go away when you stop exercising. Chest pain. Rapid heartbeat. Summary Being overweight increases your risk of heart disease, stroke, diabetes, high blood pressure, and several types of cancer. Losing weight happens when you burn more calories than you eat. Reducing the amount of calories you eat in addition to getting regular moderate or vigorous exercise each week helps you lose weight. This information is not intended to replace advice given to you by your health care provider. Make sure you discuss any questions you have with your healthcare provider. Document Revised: 02/03/2020 Document Reviewed: 02/20/2020 Elsevier Patient Education  2022 Elsevier Inc.  

## 2021-07-07 ENCOUNTER — Other Ambulatory Visit (HOSPITAL_COMMUNITY): Payer: Self-pay

## 2021-08-09 ENCOUNTER — Other Ambulatory Visit (HOSPITAL_COMMUNITY): Payer: Self-pay

## 2021-08-16 ENCOUNTER — Encounter: Payer: BC Managed Care – PPO | Admitting: Internal Medicine

## 2021-08-23 IMAGING — US US MFM OB DETAIL+14 WK
1 series · 13 of 28 positions shown · non-contrast
Comparison: none

[Series 1: us mfm ob detail+14 wk · 59 acquisitions, 13 frames shown]
[im 3/59]
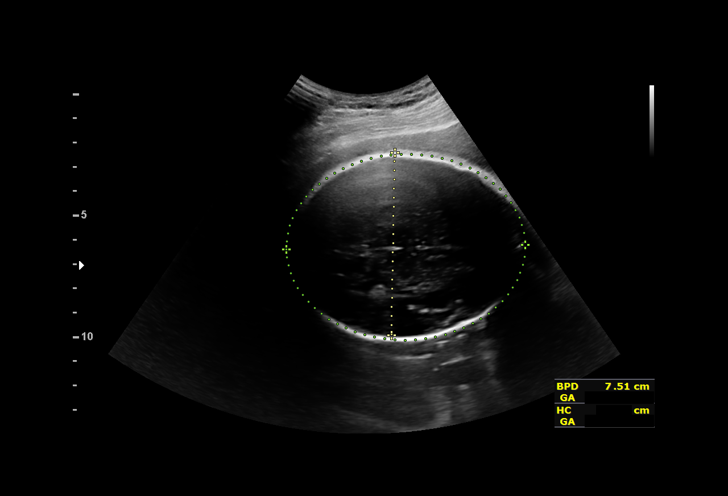
[im 7/59]
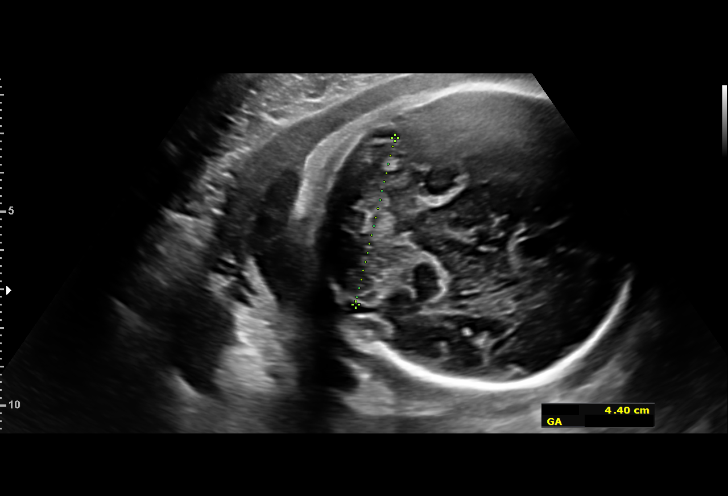
[im 11/59]
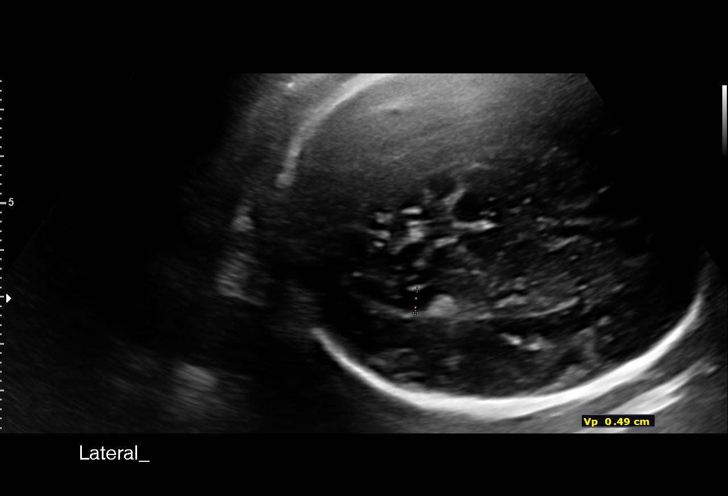
[im 16/59]
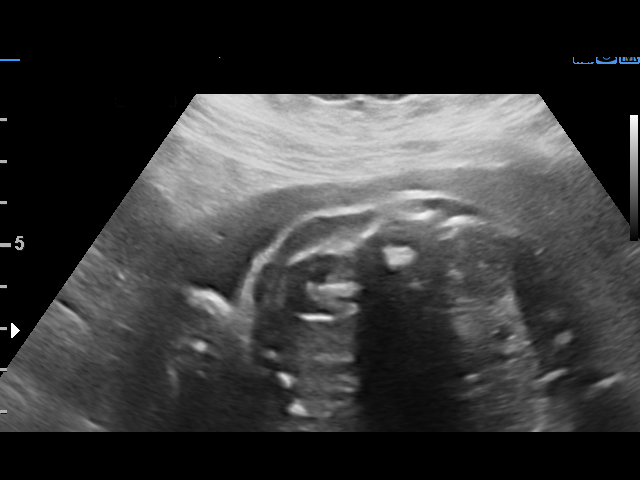
[im 20/59]
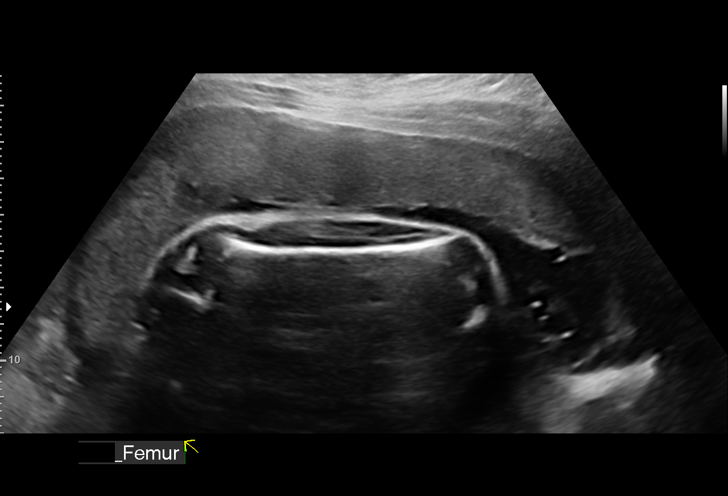
[im 24/59]
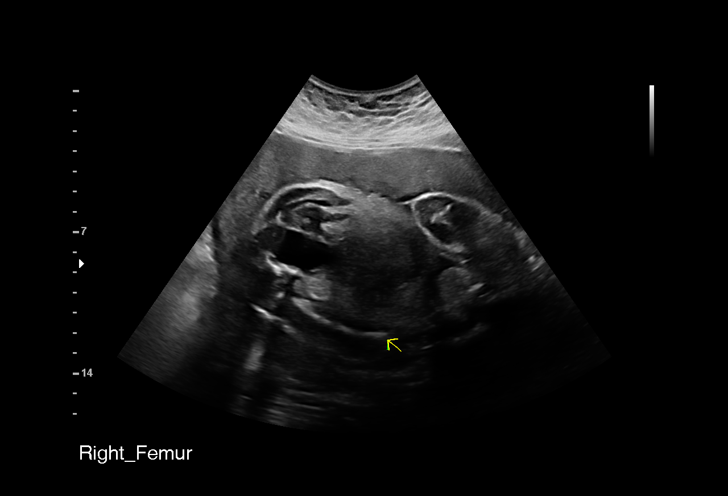
[im 31/59]
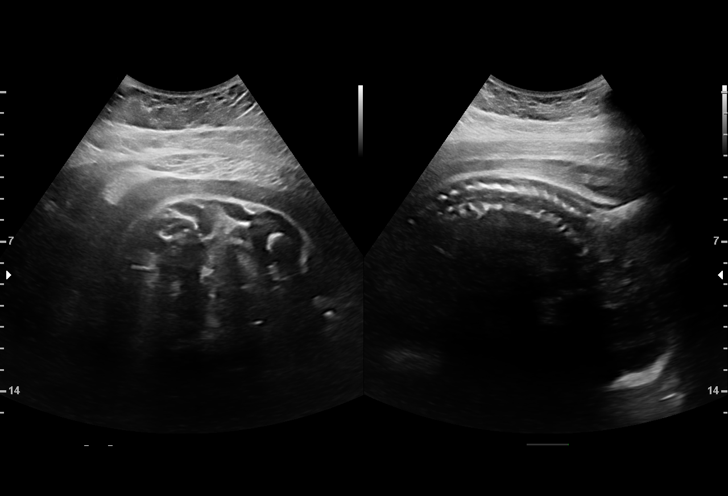
[im 35/59]
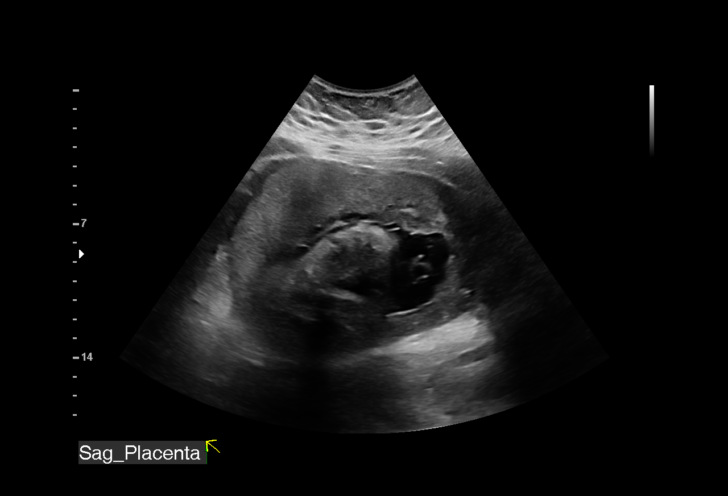
[im 39/59]
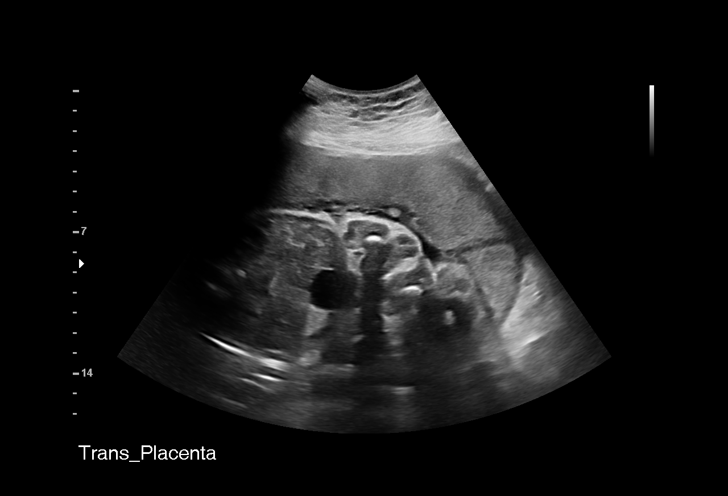
[im 43/59]
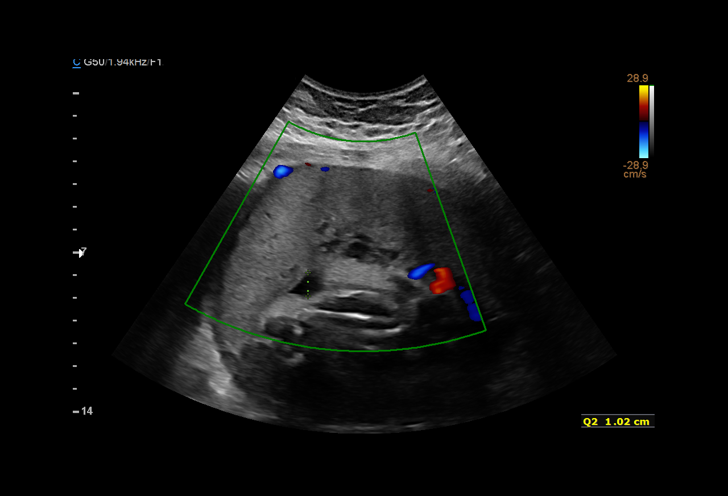
[im 48/59]
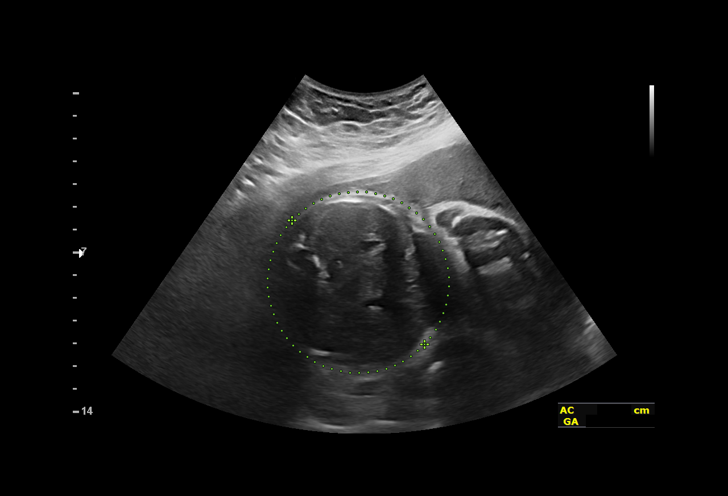
[im 52/59]
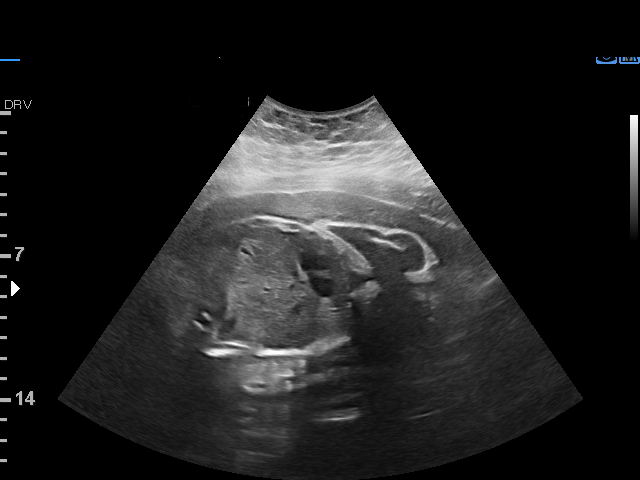
[im 56/59]
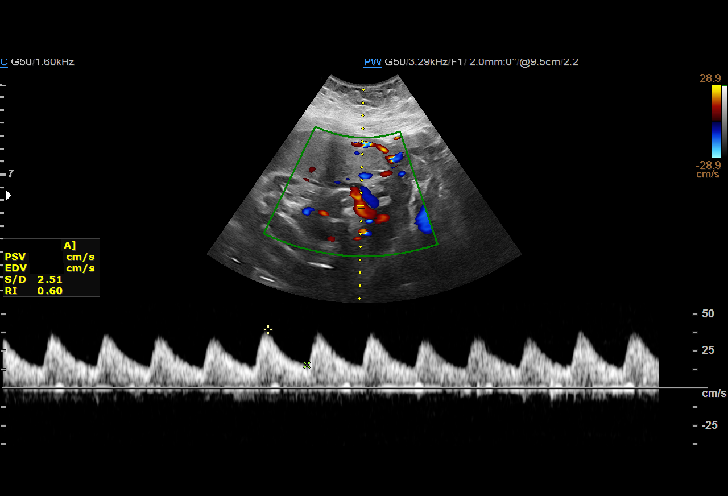

[13 of 28 positions shown; findings below may reference images not displayed]

Obstetrics &
                                                             Gynecology
                                                             [address]
                                                             [place]

  2  US MFM UA CORD DOPPLER               76820.02     EDGAR GEOVANI YAGAMI
 ----------------------------------------------------------------------

 ----------------------------------------------------------------------
Indications

  Premature rupture of membranes - leaking
  fluid
  31 weeks gestation of pregnancy
  Hypertension - Chronic/Pre-existing
  (procardia)
  Anemia during pregnancy in third trimester
  Encounter for antenatal screening for
  malformations
  History of sickle cell trait
  Obesity complicating pregnancy, third
  trimester
 ----------------------------------------------------------------------
Vital Signs

                                                Height:        5'1"
Fetal Evaluation

 Num Of Fetuses:          1
 Fetal Heart Rate(bpm):   157
 Cardiac Activity:        Observed
 Presentation:            Cephalic
 Placenta:                Anterior
 P. Cord Insertion:       Not well visualized

 Amniotic Fluid
 AFI FV:      Oligohydramnios

 AFI Sum(cm)     %Tile       Largest Pocket(cm)
 5.34            < 3

 RUQ(cm)       RLQ(cm)       LUQ(cm)        LLQ(cm)
 0

 Comment:    Breathing noted intermittently, but not sustained.
Biophysical Evaluation

 Amniotic F.V:   Pocket => 2 cm             F. Tone:         Observed
 F. Movement:    Observed                   Score:           [DATE]
 F. Breathing:   Not Observed
Biometry

 BPD:      74.3  mm     G. Age:  29w 6d          3  %    CI:        73.25   %    70 - 86
                                                         FL/HC:       22.1  %    19.1 -
 HC:      275.9  mm     G. Age:  30w 1d        < 1  %    HC/AC:       1.08       0.96 -
 AC:      256.4  mm     G. Age:  29w 6d          5  %    FL/BPD:      82.0  %    71 - 87
 FL:       60.9  mm     G. Age:  31w 4d         31  %    FL/AC:       23.8  %    20 - 24
 HUM:      54.1  mm     G. Age:  31w 3d         43  %
 CER:        44  mm     G. Age:  38w 0d       > 95  %
 LV:        4.9  mm

 Est. FW:    0538   gm     3 lb 7 oz      8  %
OB History

 Gravidity:    1
Gestational Age

 LMP:           31w 6d        Date:  04/09/19                 EDD:   01/14/20
 U/S Today:     30w 3d                                        EDD:   01/24/20
 Best:          31w 6d     Det. By:  LMP  (04/09/19)          EDD:   01/14/20
Anatomy

 Cranium:               Appears normal         Aortic Arch:            Not well visualized
 Cavum:                 Appears normal         Ductal Arch:            Not well visualized
 Ventricles:            Appears normal         Diaphragm:              Not well visualized
 Choroid Plexus:        Appears normal         Stomach:                Appears normal, left
                                                                       sided
 Cerebellum:            Appears normal         Abdomen:                Not well visualized
 Posterior Fossa:       Not well visualized    Abdominal Wall:         Not well visualized
 Nuchal Fold:           Not applicable (>20    Cord Vessels:           Appears normal (3
                        wks GA)                                        vessel cord)
 Face:                  Not well visualized    Kidneys:                Appear normal
 Lips:                  Appears normal         Bladder:                Appears normal
 Palate:                Not well visualized    Spine:                  Limited views
                                                                       appear normal
 Heart:                 Not well visualized    Upper Extremities:      LUE vis; RUE nwv
 RVOT:                  Not well visualized    Lower Extremities:      LLE vis; RLE nwv
 LVOT:                  Not well visualized
 Other:  Technically difficult due to advanced GA and fetal position and low
         fluid.
Doppler - Fetal Vessels

 Umbilical Artery
  S/D     %tile                                            ADFV    RDFV
 2.53       40                                                No      No

Cervix Uterus Adnexa

 Cervix
 Not visualized (advanced GA >55wks)
Impression

 Premature rupture of membranes with oligohydramnios
 Normal anatomy however, the EFW is <10th%, therefore has
 a diagnosis of IUGR.
 The UA Dopplers are normal however the biophysical profile
 is [DATE]

 Chronic hypertension on procardia
Recommendations

 Consider weekly BPP
 Delivery per inpatient providers

## 2021-09-07 ENCOUNTER — Other Ambulatory Visit (HOSPITAL_COMMUNITY): Payer: Self-pay

## 2021-09-08 ENCOUNTER — Other Ambulatory Visit: Payer: Self-pay

## 2021-09-08 ENCOUNTER — Other Ambulatory Visit (HOSPITAL_COMMUNITY): Payer: Self-pay

## 2021-09-08 ENCOUNTER — Ambulatory Visit (INDEPENDENT_AMBULATORY_CARE_PROVIDER_SITE_OTHER): Payer: BC Managed Care – PPO | Admitting: Internal Medicine

## 2021-09-08 ENCOUNTER — Encounter: Payer: Self-pay | Admitting: Internal Medicine

## 2021-09-08 VITALS — BP 114/90 | HR 96 | Temp 98.6°F | Ht 61.6 in | Wt 194.2 lb

## 2021-09-08 DIAGNOSIS — K5901 Slow transit constipation: Secondary | ICD-10-CM | POA: Diagnosis not present

## 2021-09-08 DIAGNOSIS — Z2821 Immunization not carried out because of patient refusal: Secondary | ICD-10-CM

## 2021-09-08 DIAGNOSIS — I10 Essential (primary) hypertension: Secondary | ICD-10-CM | POA: Diagnosis not present

## 2021-09-08 DIAGNOSIS — Z6835 Body mass index (BMI) 35.0-35.9, adult: Secondary | ICD-10-CM

## 2021-09-08 DIAGNOSIS — E66812 Obesity, class 2: Secondary | ICD-10-CM

## 2021-09-08 MED ORDER — WEGOVY 2.4 MG/0.75ML ~~LOC~~ SOAJ
2.4000 mg | SUBCUTANEOUS | 3 refills | Status: DC
  Filled 2021-09-08: qty 3, 28d supply, fill #0
  Filled 2021-10-12: qty 3, 28d supply, fill #1
  Filled 2021-11-15 – 2021-11-23 (×2): qty 3, 28d supply, fill #2
  Filled 2021-12-23: qty 3, 28d supply, fill #3

## 2021-09-08 MED ORDER — NEBIVOLOL HCL 10 MG PO TABS
10.0000 mg | ORAL_TABLET | Freq: Every day | ORAL | 1 refills | Status: DC
  Filled 2021-09-08: qty 30, 30d supply, fill #0
  Filled 2022-01-31: qty 30, 30d supply, fill #1

## 2021-09-08 NOTE — Patient Instructions (Signed)

## 2021-09-08 NOTE — Progress Notes (Signed)
Angel,Katawbba Byrd,acting as a Education administrator for [name], MD.,have documented all relevant documentation on the behalf of [name], MD,as directed by  [name], MD while in the presence of [name], MD.  This visit occurred during the SARS-CoV-2 public health emergency.  Safety protocols were in place, including screening questions prior to the visit, additional usage of staff PPE, and extensive cleaning of exam room while observing appropriate contact time as indicated for disinfecting solutions.  Subjective:     Patient ID: Angel Byrd , female    DOB: 1996/03/21 , 25 y.o.   MRN: [id number]   Chief Complaint  Patient presents with   Obesity    HPI  Patient presents today for a weight check. She has been using Angel Byrd without any issues. She is now using 2.23m weekly dose. She admits she is not getting much exercise, but she has made an effort to be more active.     Past Medical History:  Diagnosis Date   HTN (hypertension)    Iron deficiency anemia      Family History  Problem Relation Age of Onset   Sickle cell anemia Mother    Heart disease Father    Hypertension Sister      Current Outpatient Medications:    nystatin cream (MYCOSTATIN), APPLY 1 APPLICATION TOPICALLY 2 (TWO) TIMES DAILY., Disp: 30 g, Rfl: 0   triamcinolone cream (KENALOG) 0.1 %, APPLY TWO TIMES DAILY . USE AS NEEDED, Disp: 60 g, Rfl: 0   nebivolol (BYSTOLIC) 10 MG tablet, Take 1 tablet (10 mg total) by mouth daily., Disp: 90 tablet, Rfl: 1   Semaglutide-Weight Management (WEGOVY) 2.4 MG/0.75ML SOAJ, Inject 2.4 mg into the skin once a week., Disp: 3 mL, Rfl: 3   No Known Allergies   Review of Systems  Constitutional: Negative.   Respiratory: Negative.    Cardiovascular: Negative.   Gastrointestinal:  Positive for constipation.  Psychiatric/Behavioral: Negative.    All other systems reviewed and are negative.   Today's Vitals   09/08/21 1452  BP: 114/90  Pulse: 96  Temp:  98.6 F (37 C)  Weight: 194 lb 3.2 oz (88.1 kg)  Height: 5' 1.6" (1.565 m)   Body mass index is 35.98 kg/m.  Wt Readings from Last 3 Encounters:  09/08/21 194 lb 3.2 oz (88.1 kg)  06/08/21 205 lb 12.8 oz (93.4 kg)  03/24/21 224 lb (101.6 kg)    BP Readings from Last 3 Encounters:  09/08/21 114/90  06/08/21 138/82  03/24/21 140/82    Objective:  Physical Exam Vitals and nursing note reviewed.  Constitutional:      Appearance: Normal appearance.  HENT:     Head: Normocephalic and atraumatic.     Nose:     Comments: Masked     Mouth/Throat:     Comments: Masked  Eyes:     Extraocular Movements: Extraocular movements intact.  Cardiovascular:     Rate and Rhythm: Normal rate and regular rhythm.     Heart sounds: Normal heart sounds.  Pulmonary:     Effort: Pulmonary effort is normal.     Breath sounds: Normal breath sounds.  Musculoskeletal:     Cervical back: Normal range of motion.  Skin:    General: Skin is warm.  Neurological:     General: No focal deficit present.     Mental Status: She is alert.  Psychiatric:        Mood and Affect: Mood normal.  Behavior: Behavior normal.        Assessment And Plan:     1. Class 2 severe obesity due to excess calories with serious comorbidity and body mass index (BMI) of 35.0 to 35.9 in adult [facility]) Comments: She was congratulated on losing another 9 lbs since August 2022. She will c/w Wegovy. Advised to incorporate at least 150 min exercise/wk. F/u in 8 wks.   2. Essential hypertension, benign Comments: Chronic, fair control. Encouraged to incorporate more exercise into her daily routine and to follow a low sodium diet. - Insulin, random(561) - CMP14+EGFR - CBC no Diff  3. 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 She is advised to increase her water intake and reminded exercise may help with bowel motility. If sx persist, may need to consider use of Miralax 2-3 days/week w/ or w/o stool  softener.   4. Influenza vaccination declined   Patient was given opportunity to ask questions. Patient verbalized understanding of the plan and was able to repeat key elements of the plan. All questions were answered to their satisfaction.   Angel, Theda Belfast Baird [facility] MD,  have reviewed all documentation for this visit. The documentation on 10/03/21 for the exam, diagnosis, procedures, and orders are all accurate and complete.   IF YOU HAVE BEEN REFERRED TO A SPECIALIST, IT MAY TAKE 1-2 WEEKS TO SCHEDULE/PROCESS THE REFERRAL. IF YOU HAVE NOT HEARD FROM US/SPECIALIST IN TWO WEEKS, PLEASE GIVE [place] A CALL AT [phone] X [phone].   THE PATIENT IS ENCOURAGED TO PRACTICE SOCIAL DISTANCING DUE TO THE COVID-19 PANDEMIC.

## 2021-09-09 LAB — CMP14+EGFR
ALT: 6 IU/L (ref 0–32)
AST: 9 IU/L (ref 0–40)
Albumin/Globulin Ratio: 1.6 (ref 1.2–2.2)
Albumin: 4.3 g/dL (ref 3.9–5.0)
Alkaline Phosphatase: 68 IU/L (ref 44–121)
BUN/Creatinine Ratio: 6 — ABNORMAL LOW (ref 9–23)
BUN: 5 mg/dL — ABNORMAL LOW (ref 6–20)
Bilirubin Total: 0.3 mg/dL (ref 0.0–1.2)
CO2: 23 mmol/L (ref 20–29)
Calcium: 9 mg/dL (ref 8.7–10.2)
Chloride: 106 mmol/L (ref 96–106)
Creatinine, Ser: 0.79 mg/dL (ref 0.57–1.00)
Globulin, Total: 2.7 g/dL (ref 1.5–4.5)
Glucose: 80 mg/dL (ref 70–99)
Potassium: 3.9 mmol/L (ref 3.5–5.2)
Sodium: 140 mmol/L (ref 134–144)
Total Protein: 7 g/dL (ref 6.0–8.5)
eGFR: 105 mL/min/{1.73_m2} (ref >59)

## 2021-09-09 LAB — CBC
Hematocrit: 27.3 % — ABNORMAL LOW (ref 34.0–46.6)
Hemoglobin: 7.8 g/dL — ABNORMAL LOW (ref 11.1–15.9)
MCH: 19.3 pg — ABNORMAL LOW (ref 26.6–33.0)
MCHC: 28.6 g/dL — ABNORMAL LOW (ref 31.5–35.7)
MCV: 68 fL — ABNORMAL LOW (ref 79–97)
Platelets: 539 10*3/uL — ABNORMAL HIGH (ref 150–450)
RBC: 4.04 x10E6/uL (ref 3.77–5.28)
RDW: 17.2 % — ABNORMAL HIGH (ref 11.7–15.4)
WBC: 7.2 10*3/uL (ref 3.4–10.8)

## 2021-09-09 LAB — INSULIN, RANDOM: INSULIN: 17.2 u[IU]/mL (ref 2.6–24.9)

## 2021-09-13 ENCOUNTER — Other Ambulatory Visit (HOSPITAL_COMMUNITY): Payer: Self-pay

## 2021-10-12 ENCOUNTER — Other Ambulatory Visit (HOSPITAL_COMMUNITY): Payer: Self-pay

## 2021-11-15 ENCOUNTER — Other Ambulatory Visit (HOSPITAL_COMMUNITY): Payer: Self-pay

## 2021-11-23 ENCOUNTER — Encounter: Payer: Self-pay | Admitting: Internal Medicine

## 2021-11-23 ENCOUNTER — Ambulatory Visit (INDEPENDENT_AMBULATORY_CARE_PROVIDER_SITE_OTHER): Payer: BC Managed Care – PPO | Admitting: Internal Medicine

## 2021-11-23 ENCOUNTER — Other Ambulatory Visit (HOSPITAL_COMMUNITY): Payer: Self-pay

## 2021-11-23 ENCOUNTER — Other Ambulatory Visit: Payer: Self-pay

## 2021-11-23 VITALS — BP 112/80 | HR 91 | Temp 99.1°F | Ht 61.6 in | Wt 186.0 lb

## 2021-11-23 DIAGNOSIS — E6609 Other obesity due to excess calories: Secondary | ICD-10-CM | POA: Diagnosis not present

## 2021-11-23 DIAGNOSIS — N946 Dysmenorrhea, unspecified: Secondary | ICD-10-CM | POA: Diagnosis not present

## 2021-11-23 DIAGNOSIS — Z79899 Other long term (current) drug therapy: Secondary | ICD-10-CM | POA: Diagnosis not present

## 2021-11-23 DIAGNOSIS — D500 Iron deficiency anemia secondary to blood loss (chronic): Secondary | ICD-10-CM | POA: Diagnosis not present

## 2021-11-23 DIAGNOSIS — I10 Essential (primary) hypertension: Secondary | ICD-10-CM | POA: Diagnosis not present

## 2021-11-23 DIAGNOSIS — Z0000 Encounter for general adult medical examination without abnormal findings: Secondary | ICD-10-CM

## 2021-11-23 DIAGNOSIS — Z6834 Body mass index (BMI) 34.0-34.9, adult: Secondary | ICD-10-CM

## 2021-11-23 LAB — POCT URINALYSIS DIPSTICK
Bilirubin, UA: NEGATIVE
Glucose, UA: NEGATIVE
Nitrite, UA: NEGATIVE
Protein, UA: NEGATIVE
Spec Grav, UA: 1.02 (ref 1.010–1.025)
Urobilinogen, UA: 0.2 E.U./dL
pH, UA: 5.5 (ref 5.0–8.0)

## 2021-11-23 MED ORDER — TRAMADOL HCL 50 MG PO TABS
50.0000 mg | ORAL_TABLET | Freq: Four times a day (QID) | ORAL | 0 refills | Status: DC | PRN
  Filled 2021-11-23: qty 20, 5d supply, fill #0

## 2021-11-23 NOTE — Progress Notes (Signed)
Rich Brave Llittleton,acting as a Education administrator for [name], MD.,have documented all relevant documentation on the behalf of [name], MD,as directed by  [name], MD while in the presence of [name], MD.  This visit occurred during the SARS-CoV-2 public health emergency.  Safety protocols were in place, including screening questions prior to the visit, additional usage of staff PPE, and extensive cleaning of exam room while observing appropriate contact time as indicated for disinfecting solutions.  Subjective:     Patient ID: Angel Byrd , female    DOB: 1996/08/24 , 26 y.o.   MRN: [id number]   Chief Complaint  Patient presents with   Annual Exam    HPI  The patient is here today for a physical examination. She is followed by [facility] for her GYN care. She reports compliance with medications. She denies having any chest pain, shortness of breath and headaches.  Hypertension This is a chronic problem. The current episode started more than 1 year ago. The problem has been gradually improving since onset. The problem is controlled. Pertinent negatives include no blurred vision, chest pain, palpitations or shortness of breath. Risk factors for coronary artery disease include obesity and sedentary lifestyle. Past treatments include beta blockers.    Past Medical History:  Diagnosis Date   HTN (hypertension)    Iron deficiency anemia      Family History  Problem Relation Age of Onset   Sickle cell anemia Mother    Heart disease Father    Hypertension Sister      Current Outpatient Medications:    nebivolol (BYSTOLIC) 10 MG tablet, Take 1 tablet (10 mg total) by mouth daily., Disp: 90 tablet, Rfl: 1   Semaglutide-Weight Management (WEGOVY) 2.4 MG/0.75ML SOAJ, Inject 2.4 mg into the skin once a week., Disp: 3 mL, Rfl: 3   traMADol (ULTRAM) 50 MG tablet, Take 1 tablet (50 mg total) by mouth every 6 (six) hours as needed., Disp: 20 tablet, Rfl: 0   triamcinolone  cream (KENALOG) 0.1 %, APPLY TWO TIMES DAILY . USE AS NEEDED, Disp: 60 g, Rfl: 0   No Known Allergies   The patient states she uses none for birth control. Last LMP was Patient's last menstrual period was 11/17/2021.Marland Kitchen Positive for Dysmenorrhea and menorrhagia. States her sx have worsened since childbirth. Negative for: breast discharge, breast lump(s), breast pain and breast self exam. Associated symptoms include abnormal vaginal bleeding. Pertinent negatives include abnormal bleeding (hematology), anxiety, decreased libido, depression, difficulty falling sleep, dyspareunia, history of infertility, nocturia, sexual dysfunction, sleep disturbances, urinary incontinence, urinary urgency, vaginal discharge and vaginal itching. Diet regular.The patient states her exercise level is  intermittent.  . The patient's tobacco use is:  Social History   Tobacco Use  Smoking Status Never  Smokeless Tobacco Never  . She has been exposed to passive smoke. The patient's alcohol use is:  Social History   Substance and Sexual Activity  Alcohol Use No    Review of Systems  Constitutional: Negative.   HENT: Negative.    Eyes: Negative.  Negative for blurred vision.  Respiratory: Negative.  Negative for shortness of breath.   Cardiovascular: Negative.  Negative for chest pain and palpitations.  Gastrointestinal: Negative.   Endocrine: Negative.   Genitourinary: Negative.   Musculoskeletal: Negative.   Skin: Negative.   Allergic/Immunologic: Negative.   Neurological: Negative.   Hematological: Negative.   Psychiatric/Behavioral: Negative.      Today's Vitals   11/23/21 1508  BP: 112/80  Pulse: 91  Temp: 99.1 F (37.3 C)  Weight: 186 lb (84.4 kg)  Height: 5' 1.6" (1.565 m)  PainSc: 0-No pain   Body mass index is 34.46 kg/m.   Wt Readings from Last 3 Encounters:  11/23/21 186 lb (84.4 kg)  09/08/21 194 lb 3.2 oz (88.1 kg)  06/08/21 205 lb 12.8 oz (93.4 kg)     Objective:  Physical  Exam Vitals and nursing note reviewed.  Constitutional:      Appearance: Normal appearance.  HENT:     Head: Normocephalic and atraumatic.     Right Ear: Tympanic membrane, ear canal and external ear normal.     Left Ear: Tympanic membrane, ear canal and external ear normal.     Nose:     Comments: Masked     Mouth/Throat:     Pharynx: Posterior oropharyngeal erythema: ramadol.     Comments: Masked  Eyes:     Extraocular Movements: Extraocular movements intact.     Conjunctiva/sclera: Conjunctivae normal.     Pupils: Pupils are equal, round, and reactive to light.  Cardiovascular:     Rate and Rhythm: Normal rate and regular rhythm.     Pulses: Normal pulses.     Heart sounds: Normal heart sounds.  Pulmonary:     Effort: Pulmonary effort is normal.     Breath sounds: Normal breath sounds.  Abdominal:     General: Bowel sounds are normal.     Palpations: Abdomen is soft.     Comments: Obese, soft  Genitourinary:    Comments: deferred Musculoskeletal:        General: Normal range of motion.     Cervical back: Normal range of motion and neck supple.  Skin:    General: Skin is warm and dry.  Neurological:     General: No focal deficit present.     Mental Status: She is alert and oriented to person, place, and time.  Psychiatric:        Mood and Affect: Mood normal.        Behavior: Behavior normal.        Assessment And Plan:     1. Encounter for general adult medical examination w/o abnormal findings Comments: A full exam was performed. Importance of monthly self breast exams was discussed with the patient. PATIENT IS ADVISED TO GET 30-45 MINUTES REGULAR EXERCISE NO LESS THAN FOUR TO FIVE DAYS PER WEEK - BOTH WEIGHTBEARING EXERCISES AND AEROBIC ARE RECOMMENDED.  PATIENT IS ADVISED TO FOLLOW A HEALTHY DIET WITH AT LEAST SIX FRUITS/VEGGIES PER DAY, DECREASE INTAKE OF RED MEAT, AND TO INCREASE FISH INTAKE TO TWO DAYS PER WEEK.  MEATS/FISH SHOULD NOT BE FRIED, BAKED OR BROILED  IS PREFERABLE.  IT IS ALSO IMPORTANT TO CUT BACK ON YOUR SUGAR INTAKE. PLEASE AVOID ANYTHING WITH ADDED SUGAR, CORN SYRUP OR OTHER SWEETENERS. IF YOU MUST USE A SWEETENER, YOU CAN TRY STEVIA. IT IS ALSO IMPORTANT TO AVOID ARTIFICIALLY SWEETENERS AND DIET BEVERAGES. LASTLY, I SUGGEST WEARING SPF 50 SUNSCREEN ON EXPOSED PARTS AND ESPECIALLY WHEN IN THE DIRECT SUNLIGHT FOR AN EXTENDED PERIOD OF TIME.  PLEASE AVOID FAST FOOD RESTAURANTS AND INCREASE YOUR WATER INTAKE. - CBC - Lipid panel - Hemoglobin A1c  2. Essential hypertension, benign Comments: Chronic, well controlled. No med changes. EKG performed, NSR w/ poor RWP.  She is advised to follow low sodium diet. She will f/u 6 months for re-evaluation.  - POCT Urinalysis Dipstick (81002) - Microalbumin / Creatinine Urine Ratio -  EKG 12-Lead  3. Dysmenorrhea Comments: I will send rx tramadol to use prn for now. She may also benefit from [facility], will discuss further w/ her GYN if needed.  - traMADol (ULTRAM) 50 MG tablet; Take 1 tablet (50 mg total) by mouth every 6 (six) hours as needed.  Dispense: 20 tablet; Refill: 0  4. Iron deficiency anemia due to chronic blood loss Comments: Chronic, unfortunately she has not been compliant with iron supplementation.  - Iron, TIBC and Ferritin Panel  5. Class 1 obesity due to excess calories with serious comorbidity and body mass index (BMI) of 34.0 to 34.9 in adult She is encouraged to strive for BMI less than 30 to decrease cardiac risk. Advised to aim for at least 150 minutes of exercise per week.  Patient was given opportunity to ask questions. Patient verbalized understanding of the plan and was able to repeat key elements of the plan. All questions were answered to their satisfaction.   I, [name], MD, have reviewed all documentation for this visit. The documentation on 11/23/21 for the exam, diagnosis, procedures, and orders are all accurate and complete.   THE PATIENT IS ENCOURAGED TO  PRACTICE SOCIAL DISTANCING DUE TO THE COVID-19 PANDEMIC.

## 2021-11-23 NOTE — Patient Instructions (Signed)

## 2021-11-24 LAB — CBC
Hematocrit: 26.2 % — ABNORMAL LOW (ref 34.0–46.6)
Hemoglobin: 7.4 g/dL — ABNORMAL LOW (ref 11.1–15.9)
MCH: 19.1 pg — ABNORMAL LOW (ref 26.6–33.0)
MCHC: 28.2 g/dL — ABNORMAL LOW (ref 31.5–35.7)
MCV: 68 fL — ABNORMAL LOW (ref 79–97)
Platelets: 584 10*3/uL — ABNORMAL HIGH (ref 150–450)
RBC: 3.88 x10E6/uL (ref 3.77–5.28)
RDW: 18.1 % — ABNORMAL HIGH (ref 11.7–15.4)
WBC: 6.3 10*3/uL (ref 3.4–10.8)

## 2021-11-24 LAB — LIPID PANEL
Chol/HDL Ratio: 3.4 ratio (ref 0.0–4.4)
Cholesterol, Total: 156 mg/dL (ref 100–199)
HDL: 46 mg/dL (ref >39)
LDL Chol Calc (NIH): 93 mg/dL (ref 0–99)
Triglycerides: 90 mg/dL (ref 0–149)
VLDL Cholesterol Cal: 17 mg/dL (ref 5–40)

## 2021-11-24 LAB — IRON,TIBC AND FERRITIN PANEL
Ferritin: 4 ng/mL — ABNORMAL LOW (ref 15–150)
Iron Saturation: 6 % — CL (ref 15–55)
Iron: 21 ug/dL — ABNORMAL LOW (ref 27–159)
Total Iron Binding Capacity: 325 ug/dL (ref 250–450)
UIBC: 304 ug/dL (ref 131–425)

## 2021-11-24 LAB — HEMOGLOBIN A1C
Est. average glucose Bld gHb Est-mCnc: 111 mg/dL
Hgb A1c MFr Bld: 5.5 % (ref 4.8–5.6)

## 2021-11-24 LAB — MICROALBUMIN / CREATININE URINE RATIO
Creatinine, Urine: 283.2 mg/dL
Microalb/Creat Ratio: 16 mg/g creat (ref 0–29)
Microalbumin, Urine: 45.7 ug/mL

## 2021-12-23 ENCOUNTER — Other Ambulatory Visit (HOSPITAL_COMMUNITY): Payer: Self-pay

## 2021-12-24 ENCOUNTER — Encounter: Payer: Self-pay | Admitting: Internal Medicine

## 2022-01-31 ENCOUNTER — Other Ambulatory Visit: Payer: Self-pay | Admitting: Internal Medicine

## 2022-01-31 ENCOUNTER — Other Ambulatory Visit (HOSPITAL_COMMUNITY): Payer: Self-pay

## 2022-01-31 MED ORDER — WEGOVY 2.4 MG/0.75ML ~~LOC~~ SOAJ
2.4000 mg | SUBCUTANEOUS | 3 refills | Status: DC
  Filled 2022-01-31: qty 3, 28d supply, fill #0
  Filled 2022-03-08: qty 3, 28d supply, fill #1
  Filled 2022-04-18: qty 3, 28d supply, fill #2
  Filled 2022-05-23: qty 3, 28d supply, fill #3

## 2022-02-16 ENCOUNTER — Ambulatory Visit: Payer: BC Managed Care – PPO | Admitting: Internal Medicine

## 2022-03-01 ENCOUNTER — Other Ambulatory Visit (HOSPITAL_COMMUNITY): Payer: Self-pay

## 2022-03-01 ENCOUNTER — Other Ambulatory Visit: Payer: Self-pay | Admitting: Internal Medicine

## 2022-03-01 ENCOUNTER — Encounter: Payer: Self-pay | Admitting: Internal Medicine

## 2022-03-01 ENCOUNTER — Ambulatory Visit (INDEPENDENT_AMBULATORY_CARE_PROVIDER_SITE_OTHER): Payer: BC Managed Care – PPO | Admitting: Internal Medicine

## 2022-03-01 VITALS — BP 110/78 | Temp 98.7°F | Ht 61.6 in | Wt 181.0 lb

## 2022-03-01 DIAGNOSIS — D500 Iron deficiency anemia secondary to blood loss (chronic): Secondary | ICD-10-CM

## 2022-03-01 DIAGNOSIS — D573 Sickle-cell trait: Secondary | ICD-10-CM

## 2022-03-01 DIAGNOSIS — Z113 Encounter for screening for infections with a predominantly sexual mode of transmission: Secondary | ICD-10-CM | POA: Diagnosis not present

## 2022-03-01 DIAGNOSIS — E6609 Other obesity due to excess calories: Secondary | ICD-10-CM | POA: Diagnosis not present

## 2022-03-01 DIAGNOSIS — Z6833 Body mass index (BMI) 33.0-33.9, adult: Secondary | ICD-10-CM

## 2022-03-01 DIAGNOSIS — I10 Essential (primary) hypertension: Secondary | ICD-10-CM | POA: Diagnosis not present

## 2022-03-01 DIAGNOSIS — Z01419 Encounter for gynecological examination (general) (routine) without abnormal findings: Secondary | ICD-10-CM | POA: Diagnosis not present

## 2022-03-01 DIAGNOSIS — N946 Dysmenorrhea, unspecified: Secondary | ICD-10-CM | POA: Diagnosis not present

## 2022-03-01 LAB — HM PAP SMEAR

## 2022-03-01 MED ORDER — NORETHINDRONE 0.35 MG PO TABS
1.0000 | ORAL_TABLET | Freq: Every day | ORAL | 4 refills | Status: DC
  Filled 2022-03-01: qty 84, 84d supply, fill #0

## 2022-03-01 MED ORDER — NEBIVOLOL HCL 2.5 MG PO TABS
2.5000 mg | ORAL_TABLET | Freq: Every day | ORAL | 1 refills | Status: DC
  Filled 2022-03-01: qty 30, 30d supply, fill #0
  Filled 2022-05-03: qty 30, 30d supply, fill #1
  Filled 2022-06-07: qty 30, 30d supply, fill #2

## 2022-03-01 MED ORDER — ACCRUFER 30 MG PO CAPS: 30.0000 mg | ORAL_CAPSULE | Freq: Every day | ORAL | 5 refills | Status: DC

## 2022-03-01 NOTE — Patient Instructions (Signed)
Hypertension, Adult ?Hypertension is another name for high blood pressure. High blood pressure forces your heart to work harder to pump blood. This can cause problems over time. ?There are two numbers in a blood pressure reading. There is a top number (systolic) over a bottom number (diastolic). It is best to have a blood pressure that is below 120/80. ?What are the causes? ?The cause of this condition is not known. Some other conditions can lead to high blood pressure. ?What increases the risk? ?Some lifestyle factors can make you more likely to develop high blood pressure: ?Smoking. ?Not getting enough exercise or physical activity. ?Being overweight. ?Having too much fat, sugar, calories, or salt (sodium) in your diet. ?Drinking too much alcohol. ?Other risk factors include: ?Having any of these conditions: ?Heart disease. ?Diabetes. ?High cholesterol. ?Kidney disease. ?Obstructive sleep apnea. ?Having a family history of high blood pressure and high cholesterol. ?Age. The risk increases with age. ?Stress. ?What are the signs or symptoms? ?High blood pressure may not cause symptoms. Very high blood pressure (hypertensive crisis) may cause: ?Headache. ?Fast or uneven heartbeats (palpitations). ?Shortness of breath. ?Nosebleed. ?Vomiting or feeling like you may vomit (nauseous). ?Changes in how you see. ?Very bad chest pain. ?Feeling dizzy. ?Seizures. ?How is this treated? ?This condition is treated by making healthy lifestyle changes, such as: ?Eating healthy foods. ?Exercising more. ?Drinking less alcohol. ?Your doctor may prescribe medicine if lifestyle changes do not help enough and if: ?Your top number is above 130. ?Your bottom number is above 80. ?Your personal target blood pressure may vary. ?Follow these instructions at home: ?Eating and drinking ? ?If told, follow the DASH eating plan. To follow this plan: ?Fill one half of your plate at each meal with fruits and vegetables. ?Fill one fourth of your plate  at each meal with whole grains. Whole grains include whole-wheat pasta, brown rice, and whole-grain bread. ?Eat or drink low-fat dairy products, such as skim milk or low-fat yogurt. ?Fill one fourth of your plate at each meal with low-fat (lean) proteins. Low-fat proteins include fish, chicken without skin, eggs, beans, and tofu. ?Avoid fatty meat, cured and processed meat, or chicken with skin. ?Avoid pre-made or processed food. ?Limit the amount of salt in your diet to less than 1,500 mg each day. ?Do not drink alcohol if: ?Your doctor tells you not to drink. ?You are pregnant, may be pregnant, or are planning to become pregnant. ?If you drink alcohol: ?Limit how much you have to: ?0-1 drink a day for women. ?0-2 drinks a day for men. ?Know how much alcohol is in your drink. In the [place] one drink equals one 12 oz bottle of beer (355 mL), one 5 oz glass of wine (148 mL), or one 1? oz glass of hard liquor (44 mL). ?Lifestyle ? ?Work with your doctor to stay at a healthy weight or to lose weight. Ask your doctor what the best weight is for you. ?Get at least 30 minutes of exercise that causes your heart to beat faster (aerobic exercise) most days of the week. This may include walking, swimming, or biking. ?Get at least 30 minutes of exercise that strengthens your muscles (resistance exercise) at least 3 days a week. This may include lifting weights or doing Pilates. ?Do not smoke or use any products that contain nicotine or tobacco. If you need help quitting, ask your doctor. ?Check your blood pressure at home as told by your doctor. ?Keep all follow-up visits. ?Medicines ?Take over-the-counter and prescription medicines   only as told by your doctor. Follow directions carefully. ?Do not skip doses of blood pressure medicine. The medicine does not work as well if you skip doses. Skipping doses also puts you at risk for problems. ?Ask your doctor about side effects or reactions to medicines that you should watch  for. ?Contact a doctor if: ?You think you are having a reaction to the medicine you are taking. ?You have headaches that keep coming back. ?You feel dizzy. ?You have swelling in your ankles. ?You have trouble with your vision. ?Get help right away if: ?You get a very bad headache. ?You start to feel mixed up (confused). ?You feel weak or numb. ?You feel faint. ?You have very bad pain in your: ?Chest. ?Belly (abdomen). ?You vomit more than once. ?You have trouble breathing. ?These symptoms may be an emergency. Get help right away. Call 911. ?Do not wait to see if the symptoms will go away. ?Do not drive yourself to the hospital. ?Summary ?Hypertension is another name for high blood pressure. ?High blood pressure forces your heart to work harder to pump blood. ?For most people, a normal blood pressure is less than 120/80. ?Making healthy choices can help lower blood pressure. If your blood pressure does not get lower with healthy choices, you may need to take medicine. ?This information is not intended to replace advice given to you by your health care provider. Make sure you discuss any questions you have with your health care provider. ?Document Revised: 08/12/2021 Document Reviewed: 08/12/2021 ?Elsevier Patient Education ? 2023 [employer] Inc[employer] ? ?

## 2022-03-01 NOTE — Progress Notes (Signed)
?Rich Brave Llittleton,acting as a Education administrator for [name], MD.,have documented all relevant documentation on the behalf of [name], MD,as directed by  [name], MD while in the presence of [name], MD.  ?This visit occurred during the SARS-CoV-2 public health emergency.  Safety protocols were in place, including screening questions prior to the visit, additional usage of staff PPE, and extensive cleaning of exam room while observing appropriate contact time as indicated for disinfecting solutions. ? ?Subjective:  ?  ? Patient ID: Angel Byrd , female    DOB: 11/20/1995 , 26 y.o.   MRN: [id number] ? ? ?Chief Complaint  ?Patient presents with  ? Hypertension  ? ? ?HPI ? ?Patient presents today for a bp check. She states taking her Bystolic 31DV makes her feel bad, so she takes just several times per week. She denies headaches, chest pain and shortness of breath.  ? ?Hypertension ?This is a chronic problem. The current episode started more than 1 year ago. The problem has been gradually improving since onset. The problem is uncontrolled. Pertinent negatives include no blurred vision, chest pain, palpitations or shortness of breath. Risk factors for coronary artery disease include obesity and sedentary lifestyle. Past treatments include calcium channel blockers and beta blockers. The current treatment provides moderate improvement. Compliance problems include exercise.    ? ?Past Medical History:  ?Diagnosis Date  ? HTN (hypertension)   ? Iron deficiency anemia   ?  ? ?Family History  ?Problem Relation Age of Onset  ? Sickle cell anemia Mother   ? Heart disease Father   ? Hypertension Sister   ? ? ? ?Current Outpatient Medications:  ?  Ferric Maltol (ACCRUFER) 30 MG CAPS, Take 30 mg by mouth daily., Disp: 30 capsule, Rfl: 5 ?  nebivolol (BYSTOLIC) 2.5 MG tablet, Take 1 tablet (2.5 mg total) by mouth daily., Disp: 90 tablet, Rfl: 1 ?  norethindrone (MICRONOR) 0.35 MG tablet, Take 1 tablet  (0.35 mg total) by mouth daily., Disp: 84 tablet, Rfl: 4 ?  Semaglutide-Weight Management (WEGOVY) 2.4 MG/0.75ML SOAJ, Inject 2.4 mg into the skin once a week., Disp: 3 mL, Rfl: 3 ?  traMADol (ULTRAM) 50 MG tablet, Take 1 tablet (50 mg total) by mouth every 6 (six) hours as needed., Disp: 20 tablet, Rfl: 0 ?  triamcinolone cream (KENALOG) 0.1 %, APPLY TWO TIMES DAILY . USE AS NEEDED, Disp: 60 g, Rfl: 0  ? ?No Known Allergies  ? ?Review of Systems  ?Constitutional: Negative.   ?Eyes:  Negative for blurred vision.  ?Respiratory: Negative.  Negative for shortness of breath.   ?Cardiovascular: Negative.  Negative for chest pain and palpitations.  ?Gastrointestinal: Negative.   ?Neurological: Negative.   ?Psychiatric/Behavioral: Negative.     ? ?Today's Vitals  ? 03/01/22 1606  ?BP: 110/78  ?Temp: 98.7 ?F (37.1 ?C)  ?Weight: 181 lb (82.1 kg)  ?Height: 5' 1.6" (1.565 m)  ?PainSc: 0-No pain  ? ?Body mass index is 33.54 kg/m?.  ?Wt Readings from Last 3 Encounters:  ?03/01/22 181 lb (82.1 kg)  ?11/23/21 186 lb (84.4 kg)  ?09/08/21 194 lb 3.2 oz (88.1 kg)  ?  ?Objective:  ?Physical Exam ?Vitals and nursing note reviewed.  ?Constitutional:   ?   Appearance: Normal appearance.  ?HENT:  ?   Head: Normocephalic and atraumatic.  ?Eyes:  ?   Extraocular Movements: Extraocular movements intact.  ?Cardiovascular:  ?   Rate and Rhythm: Normal rate and regular rhythm.  ?  Heart sounds: Normal heart sounds.  ?Pulmonary:  ?   Effort: Pulmonary effort is normal.  ?   Breath sounds: Normal breath sounds.  ?Musculoskeletal:  ?   Cervical back: Normal range of motion.  ?Skin: ?   General: Skin is warm.  ?Neurological:  ?   General: No focal deficit present.  ?   Mental Status: She is alert.  ?Psychiatric:     ?   Mood and Affect: Mood normal.     ?   Behavior: Behavior normal.  ?  ?  ?Assessment And Plan:  ?   ?1. Essential hypertension, benign ?Comments: Chronic, well controlled. She is encouraged to follow low sodium diet.  I will  decrease dose to 2.8m daily. I will have her return in 2 weeks for a nurse visit. If uncontrolled, I will increase to 573mdaily. I will also check renal function today.  ?- CMP14+EGFR ? ?2. Iron deficiency anemia due to chronic blood loss ?Comments: Chronic, I suspect there is some non-compliance with iron supplementation.I will check CBC today, may switch her to Accrufer.  ?- CBC no Diff ? ?3. Sickle cell trait (HCLefors?Comments: Chronic, stable. She is encouraged to stay well hydrated.  ? ?4. Class 1 obesity due to excess calories with serious comorbidity and body mass index (BMI) of 33.0 to 33.9 in adult ?Comments: She was congratulated for losing another 5 lbs since Jan, 13 lbs since Nov 2022.  ? [id number] She will f/u in 10 weeks.  ? ?Patient was given opportunity to ask questions. Patient verbalized understanding of the plan and was able to repeat key elements of the plan. All questions were answered to their satisfaction.  ? ?I, RobMaximino GreenlandD, have reviewed all documentation for this visit. The documentation on 03/01/22 for the exam, diagnosis, procedures, and orders are all accurate and complete.  ? ?IF YOU HAVE BEEN REFERRED TO A SPECIALIST, IT MAY TAKE 1-2 WEEKS TO SCHEDULE/PROCESS THE REFERRAL. IF YOU HAVE NOT HEARD FROM US/SPECIALIST IN TWO WEEKS, PLEASE GIVE [place] AT [phone].  ? ?THE PATIENT IS ENCOURAGED TO PRACTICE SOCIAL DISTANCING DUE TO THE COVID-19 PANDEMIC.   ?

## 2022-03-02 ENCOUNTER — Ambulatory Visit: Payer: BC Managed Care – PPO | Admitting: Internal Medicine

## 2022-03-02 LAB — CMP14+EGFR
ALT: 8 IU/L (ref 0–32)
AST: 10 IU/L (ref 0–40)
Albumin/Globulin Ratio: 1.4 (ref 1.2–2.2)
Albumin: 4.2 g/dL (ref 3.9–5.0)
Alkaline Phosphatase: 57 IU/L (ref 44–121)
BUN/Creatinine Ratio: 12 (ref 9–23)
BUN: 8 mg/dL (ref 6–20)
Bilirubin Total: 0.4 mg/dL (ref 0.0–1.2)
CO2: 20 mmol/L (ref 20–29)
Calcium: 9 mg/dL (ref 8.7–10.2)
Chloride: 105 mmol/L (ref 96–106)
Creatinine, Ser: 0.67 mg/dL (ref 0.57–1.00)
Globulin, Total: 2.9 g/dL (ref 1.5–4.5)
Glucose: 72 mg/dL (ref 70–99)
Potassium: 4 mmol/L (ref 3.5–5.2)
Sodium: 139 mmol/L (ref 134–144)
Total Protein: 7.1 g/dL (ref 6.0–8.5)
eGFR: 122 mL/min/{1.73_m2} (ref >59)

## 2022-03-02 LAB — CBC
Hematocrit: 24.9 % — ABNORMAL LOW (ref 34.0–46.6)
Hemoglobin: 7.4 g/dL — ABNORMAL LOW (ref 11.1–15.9)
MCH: 18.6 pg — ABNORMAL LOW (ref 26.6–33.0)
MCHC: 29.7 g/dL — ABNORMAL LOW (ref 31.5–35.7)
MCV: 63 fL — ABNORMAL LOW (ref 79–97)
Platelets: 481 10*3/uL — ABNORMAL HIGH (ref 150–450)
RBC: 3.97 x10E6/uL (ref 3.77–5.28)
RDW: 18.2 % — ABNORMAL HIGH (ref 11.7–15.4)
WBC: 7.9 10*3/uL (ref 3.4–10.8)

## 2022-03-08 ENCOUNTER — Other Ambulatory Visit (HOSPITAL_COMMUNITY): Payer: Self-pay

## 2022-03-10 ENCOUNTER — Other Ambulatory Visit (HOSPITAL_COMMUNITY): Payer: Self-pay

## 2022-03-11 ENCOUNTER — Encounter: Payer: Self-pay | Admitting: Internal Medicine

## 2022-03-17 ENCOUNTER — Other Ambulatory Visit (HOSPITAL_COMMUNITY): Payer: Self-pay

## 2022-03-17 MED ORDER — SODIUM FLUORIDE 1.1 % DT PSTE
PASTE | Freq: Every day | DENTAL | 4 refills | Status: DC
  Filled 2022-03-17: qty 100, 30d supply, fill #0

## 2022-03-21 ENCOUNTER — Other Ambulatory Visit (HOSPITAL_COMMUNITY): Payer: Self-pay

## 2022-04-18 ENCOUNTER — Other Ambulatory Visit (HOSPITAL_COMMUNITY): Payer: Self-pay

## 2022-05-03 ENCOUNTER — Other Ambulatory Visit (HOSPITAL_COMMUNITY): Payer: Self-pay

## 2022-05-15 ENCOUNTER — Other Ambulatory Visit: Payer: Self-pay | Admitting: Internal Medicine

## 2022-05-15 MED ORDER — TRIAMCINOLONE ACETONIDE 0.1 % EX CREA
1.0000 | TOPICAL_CREAM | Freq: Three times a day (TID) | CUTANEOUS | 1 refills | Status: DC
Start: 2022-05-15 — End: 2022-11-08
  Filled 2022-05-15: qty 80, 30d supply, fill #0

## 2022-05-16 ENCOUNTER — Other Ambulatory Visit (HOSPITAL_COMMUNITY): Payer: Self-pay

## 2022-05-23 ENCOUNTER — Other Ambulatory Visit (HOSPITAL_COMMUNITY): Payer: Self-pay

## 2022-06-07 ENCOUNTER — Other Ambulatory Visit: Payer: Self-pay

## 2022-06-07 ENCOUNTER — Other Ambulatory Visit (HOSPITAL_COMMUNITY): Payer: Self-pay

## 2022-06-07 DIAGNOSIS — I10 Essential (primary) hypertension: Secondary | ICD-10-CM

## 2022-06-07 MED ORDER — AMLODIPINE BESYLATE 2.5 MG PO TABS
2.5000 mg | ORAL_TABLET | Freq: Every evening | ORAL | 2 refills | Status: DC
  Filled 2022-06-07: qty 30, 30d supply, fill #0
  Filled 2022-07-15: qty 30, 30d supply, fill #1
  Filled 2022-10-06: qty 30, 30d supply, fill #2

## 2022-06-28 ENCOUNTER — Ambulatory Visit: Payer: BC Managed Care – PPO

## 2022-07-12 ENCOUNTER — Encounter: Payer: Self-pay | Admitting: Internal Medicine

## 2022-07-12 ENCOUNTER — Other Ambulatory Visit (HOSPITAL_COMMUNITY): Payer: Self-pay

## 2022-07-12 ENCOUNTER — Ambulatory Visit (INDEPENDENT_AMBULATORY_CARE_PROVIDER_SITE_OTHER): Payer: BC Managed Care – PPO | Admitting: Internal Medicine

## 2022-07-12 VITALS — BP 122/86 | HR 98 | Temp 98.1°F | Ht 61.0 in | Wt 172.0 lb

## 2022-07-12 DIAGNOSIS — I10 Essential (primary) hypertension: Secondary | ICD-10-CM

## 2022-07-12 DIAGNOSIS — D500 Iron deficiency anemia secondary to blood loss (chronic): Secondary | ICD-10-CM | POA: Diagnosis not present

## 2022-07-12 DIAGNOSIS — D573 Sickle-cell trait: Secondary | ICD-10-CM | POA: Diagnosis not present

## 2022-07-12 DIAGNOSIS — Z6832 Body mass index (BMI) 32.0-32.9, adult: Secondary | ICD-10-CM

## 2022-07-12 DIAGNOSIS — E6609 Other obesity due to excess calories: Secondary | ICD-10-CM | POA: Diagnosis not present

## 2022-07-12 MED ORDER — WEGOVY 1.7 MG/0.75ML ~~LOC~~ SOAJ
1.7000 mg | SUBCUTANEOUS | 2 refills | Status: DC
Start: 2022-07-12 — End: 2022-09-21
  Filled 2022-07-12: qty 3, 28d supply, fill #0
  Filled 2022-08-23: qty 3, 28d supply, fill #1

## 2022-07-12 NOTE — Patient Instructions (Signed)
Hypertension, Adult ?Hypertension is another name for high blood pressure. High blood pressure forces your heart to work harder to pump blood. This can cause problems over time. ?There are two numbers in a blood pressure reading. There is a top number (systolic) over a bottom number (diastolic). It is best to have a blood pressure that is below 120/80. ?What are the causes? ?The cause of this condition is not known. Some other conditions can lead to high blood pressure. ?What increases the risk? ?Some lifestyle factors can make you more likely to develop high blood pressure: ?Smoking. ?Not getting enough exercise or physical activity. ?Being overweight. ?Having too much fat, sugar, calories, or salt (sodium) in your diet. ?Drinking too much alcohol. ?Other risk factors include: ?Having any of these conditions: ?Heart disease. ?Diabetes. ?High cholesterol. ?Kidney disease. ?Obstructive sleep apnea. ?Having a family history of high blood pressure and high cholesterol. ?Age. The risk increases with age. ?Stress. ?What are the signs or symptoms? ?High blood pressure may not cause symptoms. Very high blood pressure (hypertensive crisis) may cause: ?Headache. ?Fast or uneven heartbeats (palpitations). ?Shortness of breath. ?Nosebleed. ?Vomiting or feeling like you may vomit (nauseous). ?Changes in how you see. ?Very bad chest pain. ?Feeling dizzy. ?Seizures. ?How is this treated? ?This condition is treated by making healthy lifestyle changes, such as: ?Eating healthy foods. ?Exercising more. ?Drinking less alcohol. ?Your doctor may prescribe medicine if lifestyle changes do not help enough and if: ?Your top number is above 130. ?Your bottom number is above 80. ?Your personal target blood pressure may vary. ?Follow these instructions at home: ?Eating and drinking ? ?If told, follow the DASH eating plan. To follow this plan: ?Fill one half of your plate at each meal with fruits and vegetables. ?Fill one fourth of your plate  at each meal with whole grains. Whole grains include whole-wheat pasta, brown rice, and whole-grain bread. ?Eat or drink low-fat dairy products, such as skim milk or low-fat yogurt. ?Fill one fourth of your plate at each meal with low-fat (lean) proteins. Low-fat proteins include fish, chicken without skin, eggs, beans, and tofu. ?Avoid fatty meat, cured and processed meat, or chicken with skin. ?Avoid pre-made or processed food. ?Limit the amount of salt in your diet to less than 1,500 mg each day. ?Do not drink alcohol if: ?Your doctor tells you not to drink. ?You are pregnant, may be pregnant, or are planning to become pregnant. ?If you drink alcohol: ?Limit how much you have to: ?0-1 drink a day for women. ?0-2 drinks a day for men. ?Know how much alcohol is in your drink. In the [place] one drink equals one 12 oz bottle of beer (355 mL), one 5 oz glass of wine (148 mL), or one 1? oz glass of hard liquor (44 mL). ?Lifestyle ? ?Work with your doctor to stay at a healthy weight or to lose weight. Ask your doctor what the best weight is for you. ?Get at least 30 minutes of exercise that causes your heart to beat faster (aerobic exercise) most days of the week. This may include walking, swimming, or biking. ?Get at least 30 minutes of exercise that strengthens your muscles (resistance exercise) at least 3 days a week. This may include lifting weights or doing Pilates. ?Do not smoke or use any products that contain nicotine or tobacco. If you need help quitting, ask your doctor. ?Check your blood pressure at home as told by your doctor. ?Keep all follow-up visits. ?Medicines ?Take over-the-counter and prescription medicines   only as told by your doctor. Follow directions carefully. ?Do not skip doses of blood pressure medicine. The medicine does not work as well if you skip doses. Skipping doses also puts you at risk for problems. ?Ask your doctor about side effects or reactions to medicines that you should watch  for. ?Contact a doctor if: ?You think you are having a reaction to the medicine you are taking. ?You have headaches that keep coming back. ?You feel dizzy. ?You have swelling in your ankles. ?You have trouble with your vision. ?Get help right away if: ?You get a very bad headache. ?You start to feel mixed up (confused). ?You feel weak or numb. ?You feel faint. ?You have very bad pain in your: ?Chest. ?Belly (abdomen). ?You vomit more than once. ?You have trouble breathing. ?These symptoms may be an emergency. Get help right away. Call 911. ?Do not wait to see if the symptoms will go away. ?Do not drive yourself to the hospital. ?Summary ?Hypertension is another name for high blood pressure. ?High blood pressure forces your heart to work harder to pump blood. ?For most people, a normal blood pressure is less than 120/80. ?Making healthy choices can help lower blood pressure. If your blood pressure does not get lower with healthy choices, you may need to take medicine. ?This information is not intended to replace advice given to you by your health care provider. Make sure you discuss any questions you have with your health care provider. ?Document Revised: 08/12/2021 Document Reviewed: 08/12/2021 ?Elsevier Patient Education ? 2023 [employer] Inc[employer] ? ?

## 2022-07-12 NOTE — Progress Notes (Signed)
I,Victoria T Hamilton,acting as a scribe for [name], MD.,have documented all relevant documentation on the behalf of [name], MD,as directed by  [name], MD while in the presence of [name], MD.    Subjective:     Patient ID: Angel Byrd , female    DOB: 1996/06/28 , 26 y.o.   MRN: [id number]   Chief Complaint  Patient presents with   Hypertension    HPI  Patient presents today for a bp check/wegovy f/u.Marland Kitchen She states she is taking her amlodipine 2.'5MG'$  daily. She has not had any issues with the medication.    Hypertension This is a chronic problem. The current episode started more than 1 year ago. The problem has been gradually improving since onset. The problem is uncontrolled. Pertinent negatives include no blurred vision, chest pain, palpitations or shortness of breath. Risk factors for coronary artery disease include obesity and sedentary lifestyle. Past treatments include calcium channel blockers and beta blockers. The current treatment provides moderate improvement. Compliance problems include exercise.      Past Medical History:  Diagnosis Date   HTN (hypertension)    Iron deficiency anemia      Family History  Problem Relation Age of Onset   Sickle cell anemia Mother    Heart disease Father    Hypertension Sister      Current Outpatient Medications:    amLODipine (NORVASC) 2.5 MG tablet, Take 1 tablet (2.5 mg total) by mouth at bedtime., Disp: 30 tablet, Rfl: 2   Ferric Maltol (ACCRUFER) 30 MG CAPS, Take 30 mg by mouth daily., Disp: 30 capsule, Rfl: 5   norethindrone (MICRONOR) 0.35 MG tablet, Take 1 tablet (0.35 mg total) by mouth daily., Disp: 84 tablet, Rfl: 4   Semaglutide-Weight Management (WEGOVY) 1.7 MG/0.75ML SOAJ, Inject 1.7 mg into the skin once a week., Disp: 3 mL, Rfl: 2   Sodium Fluoride 1.1 % PSTE, Apply a pea-sized amount of the material onto brush and apply to all surfaces of the teeth for 2 minutes one time per day  and spit., Disp: 100 mL, Rfl: 4   triamcinolone cream (KENALOG) 0.1 %, Apply 1 Application topically 3 (three) times daily., Disp: 80 g, Rfl: 1   nebivolol (BYSTOLIC) 2.5 MG tablet, Take 1 tablet (2.5 mg total) by mouth daily. (Patient not taking: Reported on 07/12/2022), Disp: 90 tablet, Rfl: 1   traMADol (ULTRAM) 50 MG tablet, Take 1 tablet (50 mg total) by mouth every 6 (six) hours as needed. (Patient not taking: Reported on 07/12/2022), Disp: 20 tablet, Rfl: 0   No Known Allergies   Review of Systems  Constitutional: Negative.   Eyes:  Negative for blurred vision.  Respiratory: Negative.  Negative for shortness of breath.   Cardiovascular: Negative.  Negative for chest pain and palpitations.  Neurological: Negative.   Psychiatric/Behavioral: Negative.       Today's Vitals   07/12/22 1637  BP: 122/86  Pulse: 98  Temp: 98.1 F (36.7 C)  SpO2: 98%  Weight: 172 lb (78 kg)  Height: '5\' 1"'$  (1.549 m)  PainSc: 0-No pain   Body mass index is 32.5 kg/m.  Wt Readings from Last 3 Encounters:  07/12/22 172 lb (78 kg)  03/01/22 181 lb (82.1 kg)  11/23/21 186 lb (84.4 kg)    Objective:  Physical Exam Vitals and nursing note reviewed.  Constitutional:      Appearance: Normal appearance.  HENT:     Head: Normocephalic and atraumatic.  Cardiovascular:  Rate and Rhythm: Normal rate and regular rhythm.     Heart sounds: Normal heart sounds.  Pulmonary:     Effort: Pulmonary effort is normal.     Breath sounds: Normal breath sounds.  Skin:    General: Skin is warm.  Neurological:     General: No focal deficit present.     Mental Status: She is alert.  Psychiatric:        Mood and Affect: Mood normal.        Behavior: Behavior normal.         Assessment And Plan:     1. Essential hypertension, benign Comments: Chronic, well controlled.  She will c/w amlodipine 2.'5mg'$  daily.  She will f/u in Jan 2024 for her next physical exam.   2. Iron deficiency anemia due to chronic  blood loss Comments: Chronic, I will check iron panel and CBC today. Importance of compliance with iron supplementation was discussed with the patient.  - CBC no Diff - Iron, TIBC and Ferritin Panel  3. Sickle cell trait (HCC) Comments: Chronic, stable. She is encouraged to stay well hydrated.  4. Class 1 obesity due to excess calories without serious comorbidity with body mass index (BMI) of 32.0 to 32.9 in adult Comments: She was congratulated on her 9lb weight loss since April 2023. She wants to start weaning off of meds. Encouraged to strive for BMI<30 to decrease cardiac risk.  Patient was given opportunity to ask questions. Patient verbalized understanding of the plan and was able to repeat key elements of the plan. All questions were answered to their satisfaction.   I, [name], MD, have reviewed all documentation for this visit. The documentation on 07/12/22 for the exam, diagnosis, procedures, and orders are all accurate and complete.   IF YOU HAVE BEEN REFERRED TO A SPECIALIST, IT MAY TAKE 1-2 WEEKS TO SCHEDULE/PROCESS THE REFERRAL. IF YOU HAVE NOT HEARD FROM US/SPECIALIST IN TWO WEEKS, PLEASE GIVE [place] A CALL AT [phone].   THE PATIENT IS ENCOURAGED TO PRACTICE SOCIAL DISTANCING DUE TO THE COVID-19 PANDEMIC.

## 2022-07-13 ENCOUNTER — Other Ambulatory Visit: Payer: Self-pay | Admitting: Internal Medicine

## 2022-07-13 LAB — IRON,TIBC AND FERRITIN PANEL
Ferritin: 6 ng/mL — ABNORMAL LOW (ref 15–150)
Iron Saturation: 4 % — CL (ref 15–55)
Iron: 12 ug/dL — ABNORMAL LOW (ref 27–159)
Total Iron Binding Capacity: 299 ug/dL (ref 250–450)
UIBC: 287 ug/dL (ref 131–425)

## 2022-07-13 LAB — CBC
Hematocrit: 26.1 % — ABNORMAL LOW (ref 34.0–46.6)
Hemoglobin: 7.6 g/dL — ABNORMAL LOW (ref 11.1–15.9)
MCH: 19.6 pg — ABNORMAL LOW (ref 26.6–33.0)
MCHC: 29.1 g/dL — ABNORMAL LOW (ref 31.5–35.7)
MCV: 67 fL — ABNORMAL LOW (ref 79–97)
Platelets: 545 10*3/uL — ABNORMAL HIGH (ref 150–450)
RBC: 3.87 x10E6/uL (ref 3.77–5.28)
RDW: 22.1 % — ABNORMAL HIGH (ref 11.7–15.4)
WBC: 6.8 10*3/uL (ref 3.4–10.8)

## 2022-07-13 MED ORDER — ACCRUFER 30 MG PO CAPS: 30.0000 mg | ORAL_CAPSULE | Freq: Every day | ORAL | 5 refills | Status: DC

## 2022-07-15 ENCOUNTER — Other Ambulatory Visit (HOSPITAL_COMMUNITY): Payer: Self-pay

## 2022-07-21 ENCOUNTER — Telehealth: Payer: Self-pay

## 2022-07-21 NOTE — Telephone Encounter (Signed)
Called [employer] to change patients insurance

## 2022-08-12 ENCOUNTER — Telehealth: Payer: Self-pay

## 2022-08-12 NOTE — Telephone Encounter (Signed)
Pa sent to plan using CMM for Accufer.

## 2022-08-23 ENCOUNTER — Other Ambulatory Visit (HOSPITAL_COMMUNITY): Payer: Self-pay

## 2022-09-20 ENCOUNTER — Ambulatory Visit: Payer: BC Managed Care – PPO | Admitting: Internal Medicine

## 2022-09-21 ENCOUNTER — Ambulatory Visit (INDEPENDENT_AMBULATORY_CARE_PROVIDER_SITE_OTHER): Payer: BC Managed Care – PPO | Admitting: Internal Medicine

## 2022-09-21 ENCOUNTER — Encounter: Payer: Self-pay | Admitting: Internal Medicine

## 2022-09-21 VITALS — BP 136/78 | HR 98 | Temp 98.2°F | Ht 61.0 in | Wt 169.6 lb

## 2022-09-21 DIAGNOSIS — I10 Essential (primary) hypertension: Secondary | ICD-10-CM

## 2022-09-21 DIAGNOSIS — D508 Other iron deficiency anemias: Secondary | ICD-10-CM | POA: Diagnosis not present

## 2022-09-21 DIAGNOSIS — E6609 Other obesity due to excess calories: Secondary | ICD-10-CM

## 2022-09-21 DIAGNOSIS — Z6832 Body mass index (BMI) 32.0-32.9, adult: Secondary | ICD-10-CM

## 2022-09-21 DIAGNOSIS — F419 Anxiety disorder, unspecified: Secondary | ICD-10-CM

## 2022-09-21 MED ORDER — WEGOVY 1 MG/0.5ML ~~LOC~~ SOAJ
1.0000 mg | SUBCUTANEOUS | 2 refills | Status: DC
  Filled 2022-09-21: qty 2, 28d supply, fill #0
  Filled 2022-11-02: qty 2, 28d supply, fill #1

## 2022-09-21 MED ORDER — FUSION PLUS PO CAPS
1.0000 | ORAL_CAPSULE | Freq: Every day | ORAL | 3 refills | Status: DC
  Filled 2022-09-21: qty 30, 30d supply, fill #0

## 2022-09-21 MED ORDER — SERTRALINE HCL 25 MG PO TABS
ORAL_TABLET | ORAL | 0 refills | Status: DC
  Filled 2022-09-21: qty 60, 30d supply, fill #0

## 2022-09-21 NOTE — Patient Instructions (Signed)
Obesity, Adult ?Obesity is having too much body fat. Being obese means that your weight is more than what is healthy for you.  ?BMI (body mass index) is a number that explains how much body fat you have. If you have a BMI of 30 or more, you are obese. ?Obesity can cause serious health problems, such as: ?Stroke. ?Coronary artery disease (CAD). ?Type 2 diabetes. ?Some types of cancer. ?High blood pressure (hypertension). ?High cholesterol. ?Gallbladder stones. ?Obesity can also contribute to: ?Osteoarthritis. ?Sleep apnea. ?Infertility problems. ?What are the causes? ?Eating meals each day that are high in calories, sugar, and fat. ?Drinking a lot of drinks that have sugar in them. ?Being born with genes that may make you more likely to become obese. ?Having a medical condition that causes obesity. ?Taking certain medicines. ?Sitting a lot (having a sedentary lifestyle). ?Not getting enough sleep. ?What increases the risk? ?Having a family history of obesity. ?Living in an area with limited access to: ?Parks, recreation centers, or sidewalks. ?Healthy food choices, such as grocery stores and farmers' markets. ?What are the signs or symptoms? ?The main sign is having too much body fat. ?How is this treated? ?Treatment for this condition often includes changing your lifestyle. Treatment may include: ?Changing your diet. This may include making a healthy meal plan. ?Exercise. This may include activity that causes your heart to beat faster (aerobic exercise) and strength training. Work with your doctor to design a program that works for you. ?Medicine to help you lose weight. This may be used if you are not able to lose one pound a week after 6 weeks of healthy eating and more exercise. ?Treating conditions that cause the obesity. ?Surgery. Options may include gastric banding and gastric bypass. This may be done if: ?Other treatments have not helped to improve your condition. ?You have a BMI of 40 or higher. ?You have  life-threatening health problems related to obesity. ?Follow these instructions at home: ?Eating and drinking ? ?Follow advice from your doctor about what to eat and drink. Your doctor may tell you to: ?Limit fast food, sweets, and processed snack foods. ?Choose low-fat options. For example, choose low-fat milk instead of whole milk. ?Eat five or more servings of fruits or vegetables each day. ?Eat at home more often. This gives you more control over what you eat. ?Choose healthy foods when you eat out. ?Learn to read food labels. This will help you learn how much food is in one serving. ?Keep low-fat snacks available. ?Avoid drinks that have a lot of sugar in them. These include soda, fruit juice, iced tea with sugar, and flavored milk. ?Drink enough water to keep your pee (urine) pale yellow. ?Do not go on fad diets. ?Physical activity ?Exercise often, as told by your doctor. Most adults should get up to 150 minutes of moderate-intensity exercise every week.Ask your doctor: ?What types of exercise are safe for you. ?How often you should exercise. ?Warm up and stretch before being active. ?Do slow stretching after being active (cool down). ?Rest between times of being active. ?Lifestyle ?Work with your doctor and a food expert (dietitian) to set a weight-loss goal that is best for you. ?Limit your screen time. ?Find ways to reward yourself that do not involve food. ?Do not drink alcohol if: ?Your doctor tells you not to drink. ?You are pregnant, may be pregnant, or are planning to become pregnant. ?If you drink alcohol: ?Limit how much you have to: ?0-1 drink a day for women. ?0-2 drinks   a day for men. ?Know how much alcohol is in your drink. In the [place] one drink equals one 12 oz bottle of beer (355 mL), one 5 oz glass of wine (148 mL), or one 1? oz glass of hard liquor (44 mL). ?General instructions ?Keep a weight-loss journal. This can help you keep track of: ?The food that you eat. ?How much exercise you  get. ?Take over-the-counter and prescription medicines only as told by your doctor. ?Take vitamins and supplements only as told by your doctor. ?Think about joining a support group. ?Pay attention to your mental health as obesity can lead to depression or self esteem issues. ?Keep all follow-up visits. ?Contact a doctor if: ?You cannot meet your weight-loss goal after you have changed your diet and lifestyle for 6 weeks. ?You are having trouble breathing. ?Summary ?Obesity is having too much body fat. ?Being obese means that your weight is more than what is healthy for you. ?Work with your doctor to set a weight-loss goal. ?Get regular exercise as told by your doctor. ?This information is not intended to replace advice given to you by your health care provider. Make sure you discuss any questions you have with your health care provider. ?Document Revised: 06/01/2021 Document Reviewed: 06/01/2021 ?Elsevier Patient Education ? 2023 Elsevier Inc. ? ?

## 2022-09-21 NOTE — Progress Notes (Signed)
Angel Byrd,acting as a Education administrator for Angel Greenland, MD.,have documented all relevant documentation on the behalf of Angel Greenland, MD,as directed by  Angel Greenland, MD while in the presence of Angel Greenland, MD.    Subjective:     Patient ID: Angel Byrd , female    DOB: 1996/05/26 , 26 y.o.   MRN: [id number]   Chief Complaint  Patient presents with   Weight Check    HPI  Patient presents today for a BP and weight check. She has been using [place] without any issues. She is now using 1.'7mg'$  weekly dose. She admits that she does not always take BP meds daily. She thinks it may cause her to have a headache. Denies cp, sob and palpitations.   BP Readings from Last 3 Encounters: 09/21/22 : 136/78 07/12/22 : 122/86 03/01/22 : 110/78    Hypertension This is a chronic problem. The current episode started more than 1 year ago. The problem has been gradually improving since onset. The problem is uncontrolled. Pertinent negatives include no blurred vision, chest pain, palpitations or shortness of breath. Risk factors for coronary artery disease include obesity and sedentary lifestyle. Past treatments include calcium channel blockers and beta blockers. The current treatment provides moderate improvement. Compliance problems include exercise.      Past Medical History:  Diagnosis Date   HTN (hypertension)    Iron deficiency anemia      Family History  Problem Relation Age of Onset   Sickle cell anemia Mother    Heart disease Father    Hypertension Sister      Current Outpatient Medications:    amLODipine (NORVASC) 2.5 MG tablet, Take 1 tablet (2.5 mg total) by mouth at bedtime., Disp: 30 tablet, Rfl: 2   Iron-FA-B Cmp-C-Biot-Probiotic (FUSION PLUS) CAPS, One capsule po qd, Disp: 30 capsule, Rfl: 3   Semaglutide-Weight Management (WEGOVY) 1 MG/0.5ML SOAJ, Inject 1 mg into the skin once a week., Disp: 2 mL, Rfl: 2   sertraline (ZOLOFT) 25 MG tablet, One tab po qd x 7  days, then 2 tabs po qpm, Disp: 60 tablet, Rfl: 0   norethindrone (MICRONOR) 0.35 MG tablet, Take 1 tablet (0.35 mg total) by mouth daily. (Patient not taking: Reported on 09/21/2022), Disp: 84 tablet, Rfl: 4   Sodium Fluoride 1.1 % PSTE, Apply a pea-sized amount of the material onto brush and apply to all surfaces of the teeth for 2 minutes one time per day and spit. (Patient not taking: Reported on 09/21/2022), Disp: 100 mL, Rfl: 4   traMADol (ULTRAM) 50 MG tablet, Take 1 tablet (50 mg total) by mouth every 6 (six) hours as needed. (Patient not taking: Reported on 07/12/2022), Disp: 20 tablet, Rfl: 0   triamcinolone cream (KENALOG) 0.1 %, Apply 1 Application topically 3 (three) times daily. (Patient not taking: Reported on 09/21/2022), Disp: 80 g, Rfl: 1   No Known Allergies   Review of Systems  Constitutional: Negative.   HENT: Negative.    Eyes: Negative.  Negative for blurred vision.  Respiratory: Negative.  Negative for shortness of breath.   Cardiovascular: Negative.  Negative for chest pain and palpitations.  Gastrointestinal: Negative.   Psychiatric/Behavioral:  The patient is nervous/anxious.      Today's Vitals   09/21/22 1623  BP: 136/78  Pulse: 98  Temp: 98.2 F (36.8 C)  TempSrc: Oral  Weight: 169 lb 9.6 oz (76.9 kg)  Height: '5\' 1"'$  (1.549 m)  PainSc: 0-No pain  Body mass index is 32.05 kg/m.  Wt Readings from Last 3 Encounters:  09/21/22 169 lb 9.6 oz (76.9 kg)  07/12/22 172 lb (78 kg)  03/01/22 181 lb (82.1 kg)    Objective:  Physical Exam Vitals and nursing note reviewed.  Constitutional:      Appearance: Normal appearance. She is obese.  HENT:     Head: Normocephalic and atraumatic.     Nose:     Comments: Masked     Mouth/Throat:     Comments: Masked  Eyes:     Extraocular Movements: Extraocular movements intact.  Cardiovascular:     Rate and Rhythm: Normal rate and regular rhythm.     Heart sounds: Normal heart sounds.  Pulmonary:     Effort:  Pulmonary effort is normal.     Breath sounds: Normal breath sounds.  Musculoskeletal:     Cervical back: Normal range of motion.  Skin:    General: Skin is warm.  Neurological:     General: No focal deficit present.     Mental Status: She is alert.  Psychiatric:        Mood and Affect: Mood normal.        Behavior: Behavior normal.       Assessment And Plan:     1. Essential hypertension, benign Comments: Chronic, fair control. She admits non-compliance with meds, advised to take nightly. Encouraged to follow a low sodium diet.  2. Anxiety Comments: Chronic. I will start sertraline '25mg'$  x 7days, then 2 tabs po qd. Encouraged to start therapy, f/u in six weeks.  3. Other iron deficiency anemia Comments: Unfortunately, she has yet to receive rx Accrufer. She was given samples/rx Fusion Plus to take once daily.  4. Class 1 obesity due to excess calories without serious comorbidity with body mass index (BMI) of 32.0 to 32.9 in adult Comments: She has been using Wegovy 1.7, w/ occ dyspepsia. I will decrease dose to '1mg'$  weekly. She has lost 3 lbs since Sept 2023. F/u in 8 weeks.   Patient was given opportunity to ask questions. Patient verbalized understanding of the plan and was able to repeat key elements of the plan. All questions were answered to their satisfaction.   I, Angel Greenland, MD, have reviewed all documentation for this visit. The documentation on 09/21/22 for the exam, diagnosis, procedures, and orders are all accurate and complete.   IF YOU HAVE BEEN REFERRED TO A SPECIALIST, IT MAY TAKE 1-2 WEEKS TO SCHEDULE/PROCESS THE REFERRAL. IF YOU HAVE NOT HEARD FROM US/SPECIALIST IN TWO WEEKS, PLEASE GIVE [place] A CALL AT [phone].   THE PATIENT IS ENCOURAGED TO PRACTICE SOCIAL DISTANCING DUE TO THE COVID-19 PANDEMIC.

## 2022-09-22 ENCOUNTER — Other Ambulatory Visit (HOSPITAL_COMMUNITY): Payer: Self-pay

## 2022-09-26 ENCOUNTER — Other Ambulatory Visit (HOSPITAL_COMMUNITY): Payer: Self-pay

## 2022-09-26 ENCOUNTER — Telehealth: Payer: Self-pay

## 2022-09-26 NOTE — Telephone Encounter (Signed)
PA completed for [facility].

## 2022-09-27 ENCOUNTER — Telehealth: Payer: Self-pay

## 2022-09-27 ENCOUNTER — Other Ambulatory Visit (HOSPITAL_COMMUNITY): Payer: Self-pay

## 2022-09-27 NOTE — Telephone Encounter (Signed)
Patient notified that her prior auth for [facility] has been approved through 09/27/23.

## 2022-09-28 ENCOUNTER — Other Ambulatory Visit (HOSPITAL_COMMUNITY): Payer: Self-pay

## 2022-10-02 ENCOUNTER — Other Ambulatory Visit: Payer: Self-pay | Admitting: Internal Medicine

## 2022-10-02 MED ORDER — HYDROCODONE BIT-HOMATROP MBR 5-1.5 MG/5ML PO SOLN: 5.0000 mL | Freq: Four times a day (QID) | ORAL | 0 refills | Status: DC | PRN

## 2022-10-02 MED ORDER — OSELTAMIVIR PHOSPHATE 75 MG PO CAPS: 75.0000 mg | ORAL_CAPSULE | Freq: Two times a day (BID) | ORAL | 0 refills | Status: AC

## 2022-10-07 ENCOUNTER — Other Ambulatory Visit (HOSPITAL_COMMUNITY): Payer: Self-pay

## 2022-11-03 ENCOUNTER — Other Ambulatory Visit (HOSPITAL_COMMUNITY): Payer: Self-pay

## 2022-11-08 ENCOUNTER — Other Ambulatory Visit (HOSPITAL_COMMUNITY): Payer: Self-pay

## 2022-11-08 ENCOUNTER — Encounter: Payer: Self-pay | Admitting: Internal Medicine

## 2022-11-08 ENCOUNTER — Ambulatory Visit (INDEPENDENT_AMBULATORY_CARE_PROVIDER_SITE_OTHER): Payer: BC Managed Care – PPO | Admitting: Internal Medicine

## 2022-11-08 VITALS — BP 120/78 | HR 103 | Temp 98.3°F | Ht 61.0 in | Wt 170.0 lb

## 2022-11-08 DIAGNOSIS — D573 Sickle-cell trait: Secondary | ICD-10-CM | POA: Diagnosis not present

## 2022-11-08 DIAGNOSIS — F419 Anxiety disorder, unspecified: Secondary | ICD-10-CM | POA: Diagnosis not present

## 2022-11-08 DIAGNOSIS — I10 Essential (primary) hypertension: Secondary | ICD-10-CM

## 2022-11-08 DIAGNOSIS — E6609 Other obesity due to excess calories: Secondary | ICD-10-CM | POA: Diagnosis not present

## 2022-11-08 DIAGNOSIS — Z0000 Encounter for general adult medical examination without abnormal findings: Secondary | ICD-10-CM

## 2022-11-08 DIAGNOSIS — Z6832 Body mass index (BMI) 32.0-32.9, adult: Secondary | ICD-10-CM

## 2022-11-08 LAB — POCT URINALYSIS DIPSTICK
Bilirubin, UA: NEGATIVE
Blood, UA: NEGATIVE
Glucose, UA: NEGATIVE
Ketones, UA: NEGATIVE
Nitrite, UA: NEGATIVE
Protein, UA: NEGATIVE
Spec Grav, UA: 1.015 (ref 1.010–1.025)
Urobilinogen, UA: 0.2 E.U./dL
pH, UA: 7 (ref 5.0–8.0)

## 2022-11-08 MED ORDER — SERTRALINE HCL 50 MG PO TABS
50.0000 mg | ORAL_TABLET | Freq: Every day | ORAL | 2 refills | Status: DC
  Filled 2022-11-08: qty 30, 30d supply, fill #0

## 2022-11-08 MED ORDER — WEGOVY 1 MG/0.5ML ~~LOC~~ SOAJ
1.0000 mg | SUBCUTANEOUS | 2 refills | Status: DC
  Filled 2022-11-08: qty 2, 28d supply, fill #0
  Filled 2022-11-29: qty 2, 28d supply, fill #1
  Filled 2022-12-29: qty 2, 28d supply, fill #2

## 2022-11-08 NOTE — Patient Instructions (Signed)

## 2022-11-08 NOTE — Progress Notes (Signed)
Jacklynn Ganong [place],acting as a Education administrator for [name], MD.,have documented all relevant documentation on the behalf of [name], MD,as directed by  [name], MD while in the presence of [name], MD.   Subjective:     Patient ID: Angel Byrd , female    DOB: 1996/02/13 , 27 y.o.   MRN: [id number]   Chief Complaint  Patient presents with  . Annual Exam    HPI  The patient is here today for a physical examination. She is followed by [facility] for her GYN care. She reports compliance with medications. She denies having any chest pain, shortness of breath and headaches.  Hypertension This is a chronic problem. The current episode started more than 1 year ago. The problem has been gradually improving since onset. The problem is controlled. Pertinent negatives include no blurred vision. Risk factors for coronary artery disease include obesity and sedentary lifestyle. Past treatments include beta blockers.     Past Medical History:  Diagnosis Date  . HTN (hypertension)   . Iron deficiency anemia      Family History  Problem Relation Age of Onset  . Sickle cell anemia Mother   . Heart disease Father   . Hypertension Sister      Current Outpatient Medications:  .  amLODipine (NORVASC) 2.5 MG tablet, Take 1 tablet (2.5 mg total) by mouth at bedtime., Disp: 30 tablet, Rfl: 2 .  Iron-FA-B Cmp-C-Biot-Probiotic (FUSION PLUS) CAPS, Take 1 capsule by mouth daily., Disp: 30 capsule, Rfl: 3 .  norethindrone (MICRONOR) 0.35 MG tablet, Take 1 tablet (0.35 mg total) by mouth daily., Disp: 84 tablet, Rfl: 4 .  Semaglutide-Weight Management (WEGOVY) 1 MG/0.5ML SOAJ, Inject 1 mg into the skin once a week., Disp: 2 mL, Rfl: 2 .  sertraline (ZOLOFT) 25 MG tablet, Take 1 tablet (25 mg total) by mouth daily for 7 days, THEN 2 tablets (50 mg total) every evening., Disp: 60 tablet, Rfl: 0   No Known Allergies    The patient states she uses {contraceptive methods:5051} for  birth control. Last LMP was Patient's last menstrual period was 10/18/2022.Marland Kitchen {Dysmenorrhea-menorrhagia:21918}. Negative for: breast discharge, breast lump(s), breast pain and breast self exam. Associated symptoms include abnormal vaginal bleeding. Pertinent negatives include abnormal bleeding (hematology), anxiety, decreased libido, depression, difficulty falling sleep, dyspareunia, history of infertility, nocturia, sexual dysfunction, sleep disturbances, urinary incontinence, urinary urgency, vaginal discharge and vaginal itching. Diet regular.The patient states her exercise level is    . The patient's tobacco use is:  Social History   Tobacco Use  Smoking Status Never  Smokeless Tobacco Never  . She has been exposed to passive smoke. The patient's alcohol use is:  Social History   Substance and Sexual Activity  Alcohol Use No  . Additional information: Last pap ***, next one scheduled for ***.    Review of Systems  Constitutional: Negative.   HENT: Negative.    Eyes: Negative.  Negative for blurred vision.  Respiratory: Negative.    Cardiovascular: Negative.   Gastrointestinal: Negative.   Endocrine: Negative.   Genitourinary: Negative.   Musculoskeletal: Negative.   Skin: Negative.   Allergic/Immunologic: Negative.   Neurological: Negative.   Hematological: Negative.   Psychiatric/Behavioral: Negative.       Today's Vitals   11/08/22 1024  BP: 120/78  Pulse: (!) 103  Temp: 98.3 F (36.8 C)  Weight: 170 lb (77.1 kg)  Height: '5\' 1"'$  (1.549 m)  PainSc: 0-No pain   Body  mass index is 32.12 kg/m.  Wt Readings from Last 3 Encounters:  11/08/22 170 lb (77.1 kg)  09/21/22 169 lb 9.6 oz (76.9 kg)  07/12/22 172 lb (78 kg)     Objective:  Physical Exam Vitals and nursing note reviewed.  Constitutional:      Appearance: Normal appearance.  HENT:     Head: Normocephalic and atraumatic.  Cardiovascular:     Rate and Rhythm: Normal rate and regular rhythm.     Heart  sounds: Normal heart sounds.  Pulmonary:     Effort: Pulmonary effort is normal.     Breath sounds: Normal breath sounds.  Skin:    General: Skin is warm.  Neurological:     General: No focal deficit present.     Mental Status: She is alert.  Psychiatric:        Mood and Affect: Mood normal.        Behavior: Behavior normal.        Assessment And Plan:     1. Encounter for general adult medical examination w/o abnormal findings  2. Essential hypertension, benign - POCT Urinalysis Dipstick (81002) - Microalbumin / Creatinine Urine Ratio - EKG 12-Lead  3. Class 1 obesity due to excess calories without serious comorbidity with body mass index (BMI) of 32.0 to 32.9 in adult  Patient was given opportunity to ask questions. Patient verbalized understanding of the plan and was able to repeat key elements of the plan. All questions were answered to their satisfaction.   I, [name], MD, have reviewed all documentation for this visit. The documentation on 11/08/22 for the exam, diagnosis, procedures, and orders are all accurate and complete.   THE PATIENT IS ENCOURAGED TO PRACTICE SOCIAL DISTANCING DUE TO THE COVID-19 PANDEMIC.

## 2022-11-09 LAB — CBC
Hematocrit: 30 % — ABNORMAL LOW (ref 34.0–46.6)
Hemoglobin: 8.6 g/dL — ABNORMAL LOW (ref 11.1–15.9)
MCH: 20.2 pg — ABNORMAL LOW (ref 26.6–33.0)
MCHC: 28.7 g/dL — ABNORMAL LOW (ref 31.5–35.7)
MCV: 70 fL — ABNORMAL LOW (ref 79–97)
Platelets: 547 10*3/uL — ABNORMAL HIGH (ref 150–450)
RBC: 4.26 x10E6/uL (ref 3.77–5.28)
RDW: 20.9 % — ABNORMAL HIGH (ref 11.7–15.4)
WBC: 5.7 10*3/uL (ref 3.4–10.8)

## 2022-11-09 LAB — CMP14+EGFR
ALT: 8 IU/L (ref 0–32)
AST: 12 IU/L (ref 0–40)
Albumin/Globulin Ratio: 1.4 (ref 1.2–2.2)
Albumin: 4.4 g/dL (ref 4.0–5.0)
Alkaline Phosphatase: 56 IU/L (ref 44–121)
BUN/Creatinine Ratio: 8 — ABNORMAL LOW (ref 9–23)
BUN: 6 mg/dL (ref 6–20)
Bilirubin Total: 0.3 mg/dL (ref 0.0–1.2)
CO2: 20 mmol/L (ref 20–29)
Calcium: 9.1 mg/dL (ref 8.7–10.2)
Chloride: 103 mmol/L (ref 96–106)
Creatinine, Ser: 0.73 mg/dL (ref 0.57–1.00)
Globulin, Total: 3.1 g/dL (ref 1.5–4.5)
Glucose: 69 mg/dL — ABNORMAL LOW (ref 70–99)
Potassium: 4.1 mmol/L (ref 3.5–5.2)
Sodium: 138 mmol/L (ref 134–144)
Total Protein: 7.5 g/dL (ref 6.0–8.5)
eGFR: 115 mL/min/{1.73_m2} (ref >59)

## 2022-11-09 LAB — MICROALBUMIN / CREATININE URINE RATIO
Creatinine, Urine: 86.9 mg/dL
Microalb/Creat Ratio: 10 mg/g creat (ref 0–29)
Microalbumin, Urine: 8.7 ug/mL

## 2022-11-09 LAB — IRON,TIBC AND FERRITIN PANEL
Ferritin: 7 ng/mL — ABNORMAL LOW (ref 15–150)
Iron Saturation: 49 % (ref 15–55)
Iron: 159 ug/dL (ref 27–159)
Total Iron Binding Capacity: 327 ug/dL (ref 250–450)
UIBC: 168 ug/dL (ref 131–425)

## 2022-11-09 LAB — LIPID PANEL
Chol/HDL Ratio: 3 ratio (ref 0.0–4.4)
Cholesterol, Total: 157 mg/dL (ref 100–199)
HDL: 53 mg/dL (ref >39)
LDL Chol Calc (NIH): 92 mg/dL (ref 0–99)
Triglycerides: 60 mg/dL (ref 0–149)
VLDL Cholesterol Cal: 12 mg/dL (ref 5–40)

## 2022-11-09 LAB — HEMOGLOBIN A1C
Est. average glucose Bld gHb Est-mCnc: 105 mg/dL
Hgb A1c MFr Bld: 5.3 % (ref 4.8–5.6)

## 2022-11-29 ENCOUNTER — Other Ambulatory Visit: Payer: Self-pay | Admitting: Internal Medicine

## 2022-11-29 ENCOUNTER — Other Ambulatory Visit (HOSPITAL_COMMUNITY): Payer: Self-pay

## 2022-11-29 DIAGNOSIS — I10 Essential (primary) hypertension: Secondary | ICD-10-CM

## 2022-11-29 DIAGNOSIS — F4323 Adjustment disorder with mixed anxiety and depressed mood: Secondary | ICD-10-CM | POA: Diagnosis not present

## 2022-11-29 MED ORDER — AMLODIPINE BESYLATE 2.5 MG PO TABS
2.5000 mg | ORAL_TABLET | Freq: Every evening | ORAL | 2 refills | Status: DC
  Filled 2022-11-29: qty 30, 30d supply, fill #0

## 2022-12-01 ENCOUNTER — Ambulatory Visit: Payer: BC Managed Care – PPO | Admitting: Internal Medicine

## 2022-12-14 DIAGNOSIS — F4323 Adjustment disorder with mixed anxiety and depressed mood: Secondary | ICD-10-CM | POA: Diagnosis not present

## 2022-12-28 DIAGNOSIS — F4323 Adjustment disorder with mixed anxiety and depressed mood: Secondary | ICD-10-CM | POA: Diagnosis not present

## 2023-01-03 ENCOUNTER — Ambulatory Visit (INDEPENDENT_AMBULATORY_CARE_PROVIDER_SITE_OTHER): Payer: BC Managed Care – PPO | Admitting: Internal Medicine

## 2023-01-03 ENCOUNTER — Encounter: Payer: Self-pay | Admitting: Internal Medicine

## 2023-01-03 ENCOUNTER — Other Ambulatory Visit (HOSPITAL_COMMUNITY): Payer: Self-pay

## 2023-01-03 VITALS — BP 150/100 | HR 115 | Ht 61.0 in | Wt 174.2 lb

## 2023-01-03 DIAGNOSIS — F419 Anxiety disorder, unspecified: Secondary | ICD-10-CM | POA: Diagnosis not present

## 2023-01-03 DIAGNOSIS — D573 Sickle-cell trait: Secondary | ICD-10-CM | POA: Diagnosis not present

## 2023-01-03 DIAGNOSIS — Z6832 Body mass index (BMI) 32.0-32.9, adult: Secondary | ICD-10-CM

## 2023-01-03 DIAGNOSIS — E6609 Other obesity due to excess calories: Secondary | ICD-10-CM | POA: Diagnosis not present

## 2023-01-03 DIAGNOSIS — Z566 Other physical and mental strain related to work: Secondary | ICD-10-CM

## 2023-01-03 DIAGNOSIS — I10 Essential (primary) hypertension: Secondary | ICD-10-CM | POA: Diagnosis not present

## 2023-01-03 MED ORDER — WEGOVY 1.7 MG/0.75ML ~~LOC~~ SOAJ
1.7000 mg | SUBCUTANEOUS | 1 refills | Status: DC
  Filled 2023-01-03 – 2023-01-23 (×2): qty 3, 28d supply, fill #0
  Filled 2023-03-06: qty 3, 28d supply, fill #1

## 2023-01-03 MED ORDER — SERTRALINE HCL 50 MG PO TABS
75.0000 mg | ORAL_TABLET | Freq: Every day | ORAL | 2 refills | Status: DC
  Filled 2023-01-03: qty 45, 30d supply, fill #0

## 2023-01-03 MED ORDER — AMLODIPINE BESYLATE 5 MG PO TABS
5.0000 mg | ORAL_TABLET | Freq: Every day | ORAL | 11 refills | Status: DC
  Filled 2023-01-03: qty 30, 30d supply, fill #0

## 2023-01-03 NOTE — Patient Instructions (Signed)

## 2023-01-03 NOTE — Progress Notes (Signed)
I,Victoria T Hamilton,acting as a scribe for [name], MD.,have documented all relevant documentation on the behalf of [name], MD,as directed by  [name], MD while in the presence of [name], MD.    Subjective:     Patient ID: Angel Byrd , female    DOB: Mar 16, 1996 , 27 y.o.   MRN: [id number]   Chief Complaint  Patient presents with   Weight Check   Anxiety    HPI  Patient presents today for weight check & Sertraline follow up. She has been using [place] without any issues. She is now using 1 mg weekly dose. She admits she is not getting much exercise, but she has made an effort to be more active.   She reports the Sertraline does not do much for her. She does not see a difference. She admits she is under a great deal of stress. She is now working in a new department and her productivity has dropped. She has been with [employer] for six years. She is scared she may lose her job. The work stress has made the other parts of her life more overwhelming than usual.      Anxiety Presents for follow-up visit. Symptoms include insomnia, nervous/anxious behavior and panic. Patient reports no compulsions, decreased concentration or dry mouth.       Past Medical History:  Diagnosis Date   HTN (hypertension)    Iron deficiency anemia      Family History  Problem Relation Age of Onset   Sickle cell anemia Mother    Heart disease Father    Hypertension Sister      Current Outpatient Medications:    amLODipine (NORVASC) 5 MG tablet, Take 1 tablet (5 mg total) by mouth daily., Disp: 30 tablet, Rfl: 11   Iron-FA-B Cmp-C-Biot-Probiotic (FUSION PLUS) CAPS, Take 1 capsule by mouth daily., Disp: 30 capsule, Rfl: 3   Semaglutide-Weight Management (WEGOVY) 1.7 MG/0.75ML SOAJ, Inject 1.7 mg into the skin once a week., Disp: 3 mL, Rfl: 1   sertraline (ZOLOFT) 50 MG tablet, Take 1.5 tablets (75 mg total) by mouth daily., Disp: 45 tablet, Rfl: 2   No  Known Allergies   Review of Systems  Constitutional: Negative.   Respiratory: Negative.    Cardiovascular: Negative.   Musculoskeletal: Negative.   Neurological: Negative.   Psychiatric/Behavioral:  Positive for sleep disturbance. Negative for decreased concentration. The patient is nervous/anxious and has insomnia.      Today's Vitals   01/03/23 1609 01/03/23 1708  BP: (!) 160/120 (!) 150/100  Pulse: (!) 115   SpO2: 98%   Weight: 174 lb 3.2 oz (79 kg)   Height: '5\' 1"'$  (1.549 m)   PainSc: 0-No pain    Body mass index is 32.91 kg/m.  Wt Readings from Last 3 Encounters:  01/03/23 174 lb 3.2 oz (79 kg)  11/08/22 170 lb (77.1 kg)  09/21/22 169 lb 9.6 oz (76.9 kg)   BP Readings from Last 3 Encounters:  01/03/23 (!) 150/100  11/08/22 120/78  09/21/22 136/78      Objective:  Physical Exam Vitals and nursing note reviewed.  Constitutional:      Appearance: Normal appearance.  HENT:     Head: Normocephalic and atraumatic.     Nose:     Comments: Masked     Mouth/Throat:     Comments: Masked  Eyes:     Extraocular Movements: Extraocular movements intact.  Cardiovascular:     Rate  and Rhythm: Regular rhythm. Tachycardia present.     Heart sounds: Normal heart sounds.  Pulmonary:     Effort: Pulmonary effort is normal.     Breath sounds: Normal breath sounds.  Musculoskeletal:     Cervical back: Normal range of motion.  Skin:    General: Skin is warm.  Neurological:     General: No focal deficit present.     Mental Status: She is alert.  Psychiatric:        Mood and Affect: Mood normal.        Behavior: Behavior normal.     Comments: Tearful       Assessment And Plan:     1. Essential hypertension, benign Comments: Uncontrolled, I will increase amlodipine 2.'5mg'$  to '5mg'$  daily. She will start with 2 tabs nightly today. F/u 3 weeks.  2. Anxiety Comments: I will increase dose of sertraline to '75mg'$  daily. I will also refer her to Psych for further med mgmt, she is  agreeable w/ treatment plan. - sertraline (ZOLOFT) 50 MG tablet; Take 1.5 tablets (75 mg total) by mouth daily.  Dispense: 45 tablet; Refill: 2 - Ambulatory referral to Psychiatry  3. Sickle cell trait ([place]) Comments: Chronic, she is encouraged to stay well hydrated.  4. Work stress Comments: Due to markedly elevated BP, I will take her out of work for 3 weeks. I will also increase sertraline '50mg'$  1.5 tab po qd. F/u 3 weeks. Continue f/u w/ therapy.  5. Class 1 obesity due to excess calories without serious comorbidity with body mass index (BMI) of 32.0 to 32.9 in adult She is encouraged to strive for BMI less than 30 to decrease cardiac risk. Advised to aim for at least 150 minutes of exercise per week.   Patient was given opportunity to ask questions. Patient verbalized understanding of the plan and was able to repeat key elements of the plan. All questions were answered to their satisfaction.   I, [name], MD, have reviewed all documentation for this visit. The documentation on 01/03/23 for the exam, diagnosis, procedures, and orders are all accurate and complete.   IF YOU HAVE BEEN REFERRED TO A SPECIALIST, IT MAY TAKE 1-2 WEEKS TO SCHEDULE/PROCESS THE REFERRAL. IF YOU HAVE NOT HEARD FROM US/SPECIALIST IN TWO WEEKS, PLEASE GIVE [place] A CALL AT [phone].   THE PATIENT IS ENCOURAGED TO PRACTICE SOCIAL DISTANCING DUE TO THE COVID-19 PANDEMIC.

## 2023-01-04 ENCOUNTER — Other Ambulatory Visit (HOSPITAL_COMMUNITY): Payer: Self-pay

## 2023-01-11 DIAGNOSIS — F4323 Adjustment disorder with mixed anxiety and depressed mood: Secondary | ICD-10-CM | POA: Diagnosis not present

## 2023-01-23 ENCOUNTER — Other Ambulatory Visit (HOSPITAL_COMMUNITY): Payer: Self-pay

## 2023-01-24 ENCOUNTER — Other Ambulatory Visit (HOSPITAL_COMMUNITY): Payer: Self-pay

## 2023-01-24 ENCOUNTER — Ambulatory Visit: Payer: BC Managed Care – PPO

## 2023-01-24 VITALS — BP 140/110 | HR 98 | Temp 98.7°F | Ht 61.0 in | Wt 174.0 lb

## 2023-01-24 DIAGNOSIS — I10 Essential (primary) hypertension: Secondary | ICD-10-CM

## 2023-01-24 DIAGNOSIS — F4323 Adjustment disorder with mixed anxiety and depressed mood: Secondary | ICD-10-CM | POA: Diagnosis not present

## 2023-01-24 MED ORDER — VALSARTAN 80 MG PO TABS
80.0000 mg | ORAL_TABLET | Freq: Every day | ORAL | 2 refills | Status: DC
  Filled 2023-01-24: qty 30, 30d supply, fill #0

## 2023-01-24 NOTE — Progress Notes (Signed)
Patient presents today for a BP check, patient currently taking amLODipine 5mg . BP Readings from Last 3 Encounters:  01/24/23 (!) 160/120  01/03/23 (!) 150/100  11/08/22 120/78  Per provider start valsartan 80mg  in AM and amlodipine 5mg s in PM.

## 2023-01-25 ENCOUNTER — Other Ambulatory Visit (HOSPITAL_COMMUNITY): Payer: Self-pay

## 2023-02-04 ENCOUNTER — Other Ambulatory Visit (HOSPITAL_COMMUNITY): Payer: Self-pay

## 2023-02-04 DIAGNOSIS — F331 Major depressive disorder, recurrent, moderate: Secondary | ICD-10-CM | POA: Diagnosis not present

## 2023-02-04 MED ORDER — ESCITALOPRAM OXALATE 10 MG PO TABS
10.0000 mg | ORAL_TABLET | Freq: Every day | ORAL | 3 refills | Status: DC
  Filled 2023-02-04: qty 30, 30d supply, fill #0

## 2023-02-04 MED ORDER — ZALEPLON 10 MG PO CAPS
10.0000 mg | ORAL_CAPSULE | Freq: Every day | ORAL | 3 refills | Status: DC
  Filled 2023-02-04: qty 30, 30d supply, fill #0

## 2023-02-06 ENCOUNTER — Other Ambulatory Visit (HOSPITAL_COMMUNITY): Payer: Self-pay

## 2023-02-06 ENCOUNTER — Other Ambulatory Visit: Payer: Self-pay

## 2023-02-07 ENCOUNTER — Other Ambulatory Visit (HOSPITAL_COMMUNITY): Payer: Self-pay

## 2023-02-07 ENCOUNTER — Ambulatory Visit: Payer: BC Managed Care – PPO

## 2023-02-07 VITALS — BP 120/80 | HR 85 | Temp 98.5°F | Ht 61.0 in | Wt 174.0 lb

## 2023-02-07 DIAGNOSIS — F4323 Adjustment disorder with mixed anxiety and depressed mood: Secondary | ICD-10-CM | POA: Diagnosis not present

## 2023-02-07 DIAGNOSIS — I10 Essential (primary) hypertension: Secondary | ICD-10-CM | POA: Diagnosis not present

## 2023-02-07 MED ORDER — AMLODIPINE BESYLATE 2.5 MG PO TABS
2.5000 mg | ORAL_TABLET | Freq: Every day | ORAL | 11 refills | Status: DC
  Filled 2023-02-07: qty 30, 30d supply, fill #0
  Filled 2023-03-23: qty 30, 30d supply, fill #1

## 2023-02-07 MED ORDER — HYDROCHLOROTHIAZIDE 12.5 MG PO CAPS
12.5000 mg | ORAL_CAPSULE | Freq: Every day | ORAL | 2 refills | Status: DC
  Filled 2023-02-07: qty 90, 90d supply, fill #0

## 2023-02-07 NOTE — Progress Notes (Addendum)
Patient presents today for bp check, patient currently taking amLODipine 5mg  , valsartan 80mg . Patient reports headache everyday after taking BP meds. Patient also needs labs today.  BP Readings from Last 3 Encounters:  02/07/23 120/80  01/24/23 (!) 140/110  01/03/23 (!) 150/100  Per provider- decrease amlodipine to 2.5mg , but I am going to add hctz 12.5mg   valsartan and hctz in am amlodipine at night

## 2023-02-08 LAB — BMP8+EGFR
BUN/Creatinine Ratio: 13 (ref 9–23)
BUN: 8 mg/dL (ref 6–20)
CO2: 22 mmol/L (ref 20–29)
Calcium: 9.2 mg/dL (ref 8.7–10.2)
Chloride: 105 mmol/L (ref 96–106)
Creatinine, Ser: 0.61 mg/dL (ref 0.57–1.00)
Glucose: 83 mg/dL (ref 70–99)
Potassium: 4.1 mmol/L (ref 3.5–5.2)
Sodium: 140 mmol/L (ref 134–144)
eGFR: 124 mL/min/{1.73_m2} (ref >59)

## 2023-02-21 ENCOUNTER — Ambulatory Visit: Payer: Self-pay

## 2023-02-21 NOTE — Progress Notes (Signed)
Patient presents today for a BP check and labs, patient currently taking amlodipine 2.5mg , hctz 12.5mg , valsartan 

## 2023-02-22 DIAGNOSIS — F4323 Adjustment disorder with mixed anxiety and depressed mood: Secondary | ICD-10-CM | POA: Diagnosis not present

## 2023-03-04 ENCOUNTER — Other Ambulatory Visit (HOSPITAL_COMMUNITY): Payer: Self-pay

## 2023-03-04 DIAGNOSIS — F331 Major depressive disorder, recurrent, moderate: Secondary | ICD-10-CM | POA: Diagnosis not present

## 2023-03-04 MED ORDER — ESCITALOPRAM OXALATE 20 MG PO TABS
20.0000 mg | ORAL_TABLET | Freq: Every day | ORAL | 3 refills | Status: DC
  Filled 2023-03-04 – 2023-03-06 (×2): qty 30, 30d supply, fill #0

## 2023-03-04 MED ORDER — ZOLPIDEM TARTRATE 10 MG PO TABS
10.0000 mg | ORAL_TABLET | Freq: Every day | ORAL | 3 refills | Status: DC
  Filled 2023-03-04: qty 30, 30d supply, fill #0

## 2023-03-06 ENCOUNTER — Other Ambulatory Visit (HOSPITAL_COMMUNITY): Payer: Self-pay

## 2023-03-06 ENCOUNTER — Other Ambulatory Visit: Payer: Self-pay

## 2023-03-08 DIAGNOSIS — F4323 Adjustment disorder with mixed anxiety and depressed mood: Secondary | ICD-10-CM | POA: Diagnosis not present

## 2023-03-10 ENCOUNTER — Other Ambulatory Visit (HOSPITAL_COMMUNITY): Payer: Self-pay

## 2023-03-29 DIAGNOSIS — F331 Major depressive disorder, recurrent, moderate: Secondary | ICD-10-CM | POA: Diagnosis not present

## 2023-04-04 ENCOUNTER — Ambulatory Visit: Payer: Self-pay | Admitting: Internal Medicine

## 2023-04-05 ENCOUNTER — Ambulatory Visit (INDEPENDENT_AMBULATORY_CARE_PROVIDER_SITE_OTHER): Payer: BC Managed Care – PPO | Admitting: Internal Medicine

## 2023-04-05 ENCOUNTER — Encounter: Payer: Self-pay | Admitting: Internal Medicine

## 2023-04-05 ENCOUNTER — Other Ambulatory Visit (HOSPITAL_COMMUNITY): Payer: Self-pay

## 2023-04-05 VITALS — BP 130/88 | HR 83 | Temp 98.3°F | Ht 61.0 in | Wt 178.8 lb

## 2023-04-05 DIAGNOSIS — F419 Anxiety disorder, unspecified: Secondary | ICD-10-CM | POA: Diagnosis not present

## 2023-04-05 DIAGNOSIS — I10 Essential (primary) hypertension: Secondary | ICD-10-CM | POA: Diagnosis not present

## 2023-04-05 DIAGNOSIS — D508 Other iron deficiency anemias: Secondary | ICD-10-CM

## 2023-04-05 DIAGNOSIS — E6609 Other obesity due to excess calories: Secondary | ICD-10-CM

## 2023-04-05 DIAGNOSIS — L708 Other acne: Secondary | ICD-10-CM | POA: Diagnosis not present

## 2023-04-05 DIAGNOSIS — Z6833 Body mass index (BMI) 33.0-33.9, adult: Secondary | ICD-10-CM

## 2023-04-05 MED ORDER — AMLODIPINE BESYLATE 5 MG PO TABS
5.0000 mg | ORAL_TABLET | Freq: Every day | ORAL | 1 refills | Status: DC
  Filled 2023-04-05: qty 90, 90d supply, fill #0

## 2023-04-05 MED ORDER — WEGOVY 1.7 MG/0.75ML ~~LOC~~ SOAJ
1.7000 mg | SUBCUTANEOUS | 2 refills | Status: DC
  Filled 2023-04-05: qty 3, 28d supply, fill #0
  Filled 2023-05-14: qty 3, 28d supply, fill #1
  Filled 2023-07-03: qty 3, 28d supply, fill #2

## 2023-04-05 NOTE — Progress Notes (Signed)
I,Victoria T Hamilton,acting as a scribe for Gwynneth Aliment, MD.,have documented all relevant documentation on the behalf of Gwynneth Aliment, MD,as directed by  Gwynneth Aliment, MD while in the presence of Gwynneth Aliment, MD.    Subjective:     Patient ID: Angel Byrd , female    DOB: 08-Oct-1996 , 27 y.o.   MRN: [id number]   Chief Complaint  Patient presents with  . Weight Check    HPI  Patient presents today for weight check. She has been using [place] without any issues. She is now using 1.7mg  weekly dose. She admits she is not getting much exercise, but she has made an effort to be more active.   She reports taking her shot ever 2 weeks instead of weekly. She admits noticing weight gain.   She also requests her FMLA paperwork to be renewed.        Anxiety Presents for follow-up visit. Symptoms include panic. Patient reports no compulsions, decreased concentration or dry mouth.      Past Medical History:  Diagnosis Date  . HTN (hypertension)   . Iron deficiency anemia      Family History  Problem Relation Age of Onset  . Sickle cell anemia Mother   . Heart disease Father   . Hypertension Sister      Current Outpatient Medications:  .  amLODipine (NORVASC) 2.5 MG tablet, Take 1 tablet (2.5 mg total) by mouth daily., Disp: 30 tablet, Rfl: 11 .  escitalopram (LEXAPRO) 10 MG tablet, Take 1 tablet (10 mg total) by mouth daily., Disp: 30 tablet, Rfl: 3 .  escitalopram (LEXAPRO) 20 MG tablet, Take 1 tablet (20 mg total) by mouth daily., Disp: 30 tablet, Rfl: 3 .  hydrochlorothiazide (MICROZIDE) 12.5 MG capsule, Take 1 capsule (12.5 mg total) by mouth daily., Disp: 90 capsule, Rfl: 2 .  Iron-FA-B Cmp-C-Biot-Probiotic (FUSION PLUS) CAPS, Take 1 capsule by mouth daily., Disp: 30 capsule, Rfl: 3 .  Semaglutide-Weight Management (WEGOVY) 1.7 MG/0.75ML SOAJ, Inject 1.7 mg into the skin once a week., Disp: 3 mL, Rfl: 1 .  sertraline (ZOLOFT) 50 MG tablet, Take 1.5  tablets (75 mg total) by mouth daily., Disp: 45 tablet, Rfl: 2 .  valsartan (DIOVAN) 80 MG tablet, Take 1 tablet (80 mg total) by mouth daily., Disp: 30 tablet, Rfl: 2 .  zaleplon (SONATA) 10 MG capsule, Take 1 capsule (10 mg total) by mouth daily., Disp: 30 capsule, Rfl: 3 .  zolpidem (AMBIEN) 10 MG tablet, Take 1 tablet (10 mg total) by mouth daily., Disp: 30 tablet, Rfl: 3   No Known Allergies   Review of Systems  Constitutional: Negative.   Respiratory: Negative.    Cardiovascular: Negative.   Neurological: Negative.   Psychiatric/Behavioral: Negative.  Negative for decreased concentration.      Today's Vitals   04/05/23 1634  BP: 130/88  Pulse: 83  Temp: 98.3 F (36.8 C)  SpO2: 98%  Weight: 178 lb 12.8 oz (81.1 kg)  Height: 5\' 1"  (1.549 m)   Body mass index is 33.78 kg/m.  Wt Readings from Last 3 Encounters:  04/05/23 178 lb 12.8 oz (81.1 kg)  02/07/23 174 lb (78.9 kg)  01/24/23 174 lb (78.9 kg)    BP Readings from Last 3 Encounters:  04/05/23 130/88  02/07/23 120/80  01/24/23 (!) 140/110    The ASCVD Risk score (Arnett DK, et al., 2019) failed to calculate for the following reasons:   The 2019 ASCVD risk score is only  valid for ages 54 to 78 ++ Objective:  Physical Exam Vitals and nursing note reviewed.  Constitutional:      Appearance: Normal appearance.  HENT:     Head: Normocephalic and atraumatic.  Eyes:     Extraocular Movements: Extraocular movements intact.  Cardiovascular:     Rate and Rhythm: Normal rate and regular rhythm.     Heart sounds: Normal heart sounds.  Pulmonary:     Effort: Pulmonary effort is normal.     Breath sounds: Normal breath sounds.  Musculoskeletal:     Cervical back: Normal range of motion.  Skin:    General: Skin is warm.  Neurological:     General: No focal deficit present.     Mental Status: She is alert.  Psychiatric:        Mood and Affect: Mood normal.        Behavior: Behavior normal.        Assessment  And Plan:     1. Class 1 obesity due to excess calories without serious comorbidity with body mass index (BMI) of 32.0 to 32.9 in adult  2. Other acne    No follow-ups on file.  Patient was given opportunity to ask questions. Patient verbalized understanding of the plan and was able to repeat key elements of the plan. All questions were answered to their satisfaction.   I, Gwynneth Aliment, MD, have reviewed all documentation for this visit. The documentation on 04/05/23 for the exam, diagnosis, procedures, and orders are all accurate and complete.   IF YOU HAVE BEEN REFERRED TO A SPECIALIST, IT MAY TAKE 1-2 WEEKS TO SCHEDULE/PROCESS THE REFERRAL. IF YOU HAVE NOT HEARD FROM US/SPECIALIST IN TWO WEEKS, PLEASE GIVE [place] A CALL AT [phone].   THE PATIENT IS ENCOURAGED TO PRACTICE SOCIAL DISTANCING DUE TO THE COVID-19 PANDEMIC.

## 2023-04-05 NOTE — Patient Instructions (Signed)

## 2023-04-18 DIAGNOSIS — F419 Anxiety disorder, unspecified: Secondary | ICD-10-CM | POA: Diagnosis not present

## 2023-05-27 DIAGNOSIS — F411 Generalized anxiety disorder: Secondary | ICD-10-CM | POA: Diagnosis not present

## 2023-05-28 DIAGNOSIS — F4323 Adjustment disorder with mixed anxiety and depressed mood: Secondary | ICD-10-CM | POA: Diagnosis not present

## 2023-05-29 ENCOUNTER — Other Ambulatory Visit (HOSPITAL_COMMUNITY): Payer: Self-pay

## 2023-05-29 ENCOUNTER — Other Ambulatory Visit: Payer: Self-pay

## 2023-05-29 MED ORDER — ZOLPIDEM TARTRATE 10 MG PO TABS
10.0000 mg | ORAL_TABLET | Freq: Every day | ORAL | 3 refills | Status: DC
  Filled 2023-05-29: qty 30, 30d supply, fill #0

## 2023-05-29 MED ORDER — ESCITALOPRAM OXALATE 20 MG PO TABS
20.0000 mg | ORAL_TABLET | Freq: Every day | ORAL | 3 refills | Status: DC
  Filled 2023-05-29: qty 30, 30d supply, fill #0

## 2023-06-08 ENCOUNTER — Other Ambulatory Visit (HOSPITAL_COMMUNITY): Payer: Self-pay

## 2023-06-20 ENCOUNTER — Ambulatory Visit: Payer: Self-pay | Admitting: Internal Medicine

## 2023-06-22 ENCOUNTER — Ambulatory Visit: Payer: BC Managed Care – PPO | Admitting: Internal Medicine

## 2023-07-04 ENCOUNTER — Other Ambulatory Visit (HOSPITAL_COMMUNITY): Payer: Self-pay

## 2023-07-19 ENCOUNTER — Ambulatory Visit (INDEPENDENT_AMBULATORY_CARE_PROVIDER_SITE_OTHER): Payer: BC Managed Care – PPO | Admitting: Internal Medicine

## 2023-07-19 ENCOUNTER — Other Ambulatory Visit (HOSPITAL_COMMUNITY): Payer: Self-pay

## 2023-07-19 ENCOUNTER — Encounter: Payer: Self-pay | Admitting: Internal Medicine

## 2023-07-19 VITALS — BP 126/88 | HR 75 | Temp 98.5°F | Ht 61.0 in | Wt 182.6 lb

## 2023-07-19 DIAGNOSIS — I10 Essential (primary) hypertension: Secondary | ICD-10-CM | POA: Diagnosis not present

## 2023-07-19 DIAGNOSIS — Z6834 Body mass index (BMI) 34.0-34.9, adult: Secondary | ICD-10-CM

## 2023-07-19 DIAGNOSIS — D573 Sickle-cell trait: Secondary | ICD-10-CM | POA: Diagnosis not present

## 2023-07-19 DIAGNOSIS — F419 Anxiety disorder, unspecified: Secondary | ICD-10-CM | POA: Diagnosis not present

## 2023-07-19 DIAGNOSIS — Z23 Encounter for immunization: Secondary | ICD-10-CM

## 2023-07-19 DIAGNOSIS — D500 Iron deficiency anemia secondary to blood loss (chronic): Secondary | ICD-10-CM | POA: Diagnosis not present

## 2023-07-19 DIAGNOSIS — E6609 Other obesity due to excess calories: Secondary | ICD-10-CM

## 2023-07-19 MED ORDER — HYDROCHLOROTHIAZIDE 12.5 MG PO CAPS
12.5000 mg | ORAL_CAPSULE | Freq: Every day | ORAL | 2 refills | Status: DC
Start: 2023-07-19 — End: 2024-01-23
  Filled 2023-07-19: qty 30, 30d supply, fill #0

## 2023-07-19 MED ORDER — WEGOVY 2.4 MG/0.75ML ~~LOC~~ SOAJ
2.4000 mg | SUBCUTANEOUS | 3 refills | Status: DC
  Filled 2023-07-19: qty 3, 28d supply, fill #0
  Filled 2023-08-14: qty 3, 28d supply, fill #1
  Filled 2023-09-10 – 2023-09-20 (×2): qty 3, 28d supply, fill #2
  Filled 2023-10-10 – 2023-10-17 (×4): qty 3, 28d supply, fill #3

## 2023-07-19 MED ORDER — VALSARTAN 80 MG PO TABS
80.0000 mg | ORAL_TABLET | Freq: Every day | ORAL | 2 refills | Status: DC
Start: 2023-07-19 — End: 2024-02-15
  Filled 2023-07-19: qty 30, 30d supply, fill #0

## 2023-07-19 MED ORDER — ESCITALOPRAM OXALATE 10 MG PO TABS
10.0000 mg | ORAL_TABLET | Freq: Every day | ORAL | 1 refills | Status: AC
  Filled 2023-07-19: qty 30, 30d supply, fill #0

## 2023-07-19 NOTE — Progress Notes (Signed)
I,Victoria T Deloria Lair, CMA,acting as a Neurosurgeon for Gwynneth Aliment, MD.,have documented all relevant documentation on the behalf of Gwynneth Aliment, MD,as directed by  Gwynneth Aliment, MD while in the presence of Gwynneth Aliment, MD.  Subjective:  Patient ID: Angel Byrd , female    DOB: 1996-03-31 , 27 y.o.   MRN: [id number]  Chief Complaint  Patient presents with   Hypertension   Weight Check    HPI  Pt is here for BP and weight follow up. She admits to being compliant with medications. No report of chest pain, headache, or SOB. She has no other concerns at this time. She is currently under the care of Dr. Jacqulyn Bath for anxiety.      Hypertension This is a chronic problem. The current episode started more than 1 year ago. The problem has been gradually improving since onset. The problem is uncontrolled. Associated symptoms include anxiety. Pertinent negatives include no blurred vision, chest pain, palpitations or shortness of breath. Risk factors for coronary artery disease include obesity and sedentary lifestyle. Past treatments include calcium channel blockers and beta blockers. The current treatment provides moderate improvement. Compliance problems include exercise.   Anxiety Presents for follow-up visit. Symptoms include insomnia, nervous/anxious behavior and panic. Patient reports no chest pain, compulsions, decreased concentration, dry mouth, palpitations, shortness of breath or suicidal ideas. The quality of sleep is non-restorative.       Past Medical History:  Diagnosis Date   HTN (hypertension)    Iron deficiency anemia      Family History  Problem Relation Age of Onset   Sickle cell anemia Mother    Heart disease Father    Hypertension Sister      Current Outpatient Medications:    Semaglutide-Weight Management (WEGOVY) 2.4 MG/0.75ML SOAJ, Inject 2.4 mg into the skin once a week., Disp: 3 mL, Rfl: 3   escitalopram (LEXAPRO) 10 MG tablet, Take 1 tablet (10 mg  total) by mouth daily., Disp: 90 tablet, Rfl: 1   escitalopram (LEXAPRO) 20 MG tablet, Take 1 tablet (20 mg total) by mouth daily., Disp: 30 tablet, Rfl: 3   hydrochlorothiazide (MICROZIDE) 12.5 MG capsule, Take 1 capsule (12.5 mg total) by mouth daily., Disp: 90 capsule, Rfl: 2   Iron-FA-B Cmp-C-Biot-Probiotic (FUSION PLUS) CAPS, Take 1 capsule by mouth daily. (Patient not taking: Reported on 07/19/2023), Disp: 30 capsule, Rfl: 3   valsartan (DIOVAN) 80 MG tablet, Take 1 tablet (80 mg total) by mouth daily., Disp: 90 tablet, Rfl: 2   zolpidem (AMBIEN) 10 MG tablet, Take 1 tablet (10 mg total) by mouth daily., Disp: 30 tablet, Rfl: 3   No Known Allergies   Review of Systems  Constitutional: Negative.   Eyes:  Negative for blurred vision.  Respiratory: Negative.  Negative for shortness of breath.   Cardiovascular: Negative.  Negative for chest pain and palpitations.  Gastrointestinal: Negative.   Neurological: Negative.   Psychiatric/Behavioral:  Negative for decreased concentration and suicidal ideas. The patient is nervous/anxious and has insomnia.      Today's Vitals   07/19/23 1512  BP: 126/88  Pulse: 75  Temp: 98.5 F (36.9 C)  SpO2: 99%  Weight: 182 lb 9.6 oz (82.8 kg)  Height: 5\' 1"  (1.549 m)   Body mass index is 34.5 kg/m.  Wt Readings from Last 3 Encounters:  07/19/23 182 lb 9.6 oz (82.8 kg)  04/05/23 178 lb 12.8 oz (81.1 kg)  02/07/23 174 lb (78.9 kg)     Objective:  Physical Exam Vitals and nursing note reviewed.  Constitutional:      Appearance: Normal appearance.  HENT:     Head: Normocephalic and atraumatic.  Eyes:     Extraocular Movements: Extraocular movements intact.  Cardiovascular:     Rate and Rhythm: Normal rate and regular rhythm.     Heart sounds: Normal heart sounds.  Pulmonary:     Effort: Pulmonary effort is normal.     Breath sounds: Normal breath sounds.  Musculoskeletal:     Cervical back: Normal range of motion.  Skin:    General:  Skin is warm.  Neurological:     General: No focal deficit present.     Mental Status: She is alert.  Psychiatric:        Mood and Affect: Mood normal.        Behavior: Behavior normal.         Assessment And Plan:  Essential hypertension, benign Assessment & Plan: Chronic, diastolic elevation noted. She will continue with valsartan 80mg , hct 12.5mg  daily. May need to consider increasing valsartan in the future. She recently started walking for exercise, I expect her BP to furher improve. Will recheck in 4-6 months.  Orders: -     hydroCHLOROthiazide; Take 1 capsule (12.5 mg total) by mouth daily.  Dispense: 90 capsule; Refill: 2 -     Valsartan; Take 1 tablet (80 mg total) by mouth daily.  Dispense: 90 tablet; Refill: 2  Iron deficiency anemia due to chronic blood loss Assessment & Plan: Chronic, exacerbated by menorrhagia and non-compliance with iron supplementation. She declines iron infusion.   Orders: -     CBC -     Iron, TIBC and Ferritin Panel  Anxiety Assessment & Plan: Chronic, med mgmt as per Dr. Jacqulyn Bath. She will continue with escitalopram daily.    Sickle cell trait (HCC) Assessment & Plan: Chronic, encouraged to stay well hydrated.    Class 1 obesity due to excess calories with serious comorbidity and body mass index (BMI) of 34.0 to 34.9 in adult Assessment & Plan: She has requested increase in Wegovy dose, will send 2.4mg  dose to the pharmacy. She will f/u in 8 weeks for re-evaluation.    Immunization due -     Flu vaccine trivalent PF, 6mos and older(Flulaval,Afluria,Fluarix,Fluzone)  Other orders -     FAOZHY; Inject 2.4 mg into the skin once a week.  Dispense: 3 mL; Refill: 3 -     Escitalopram Oxalate; Take 1 tablet (10 mg total) by mouth daily.  Dispense: 90 tablet; Refill: 1  She is encouraged to strive for BMI less than 30 to decrease cardiac risk. Advised to aim for at least 150 minutes of exercise per week.    Return in 4 weeks (on  08/16/2023), or [place] - bp check/lab bmp.  Patient was given opportunity to ask questions. Patient verbalized understanding of the plan and was able to repeat key elements of the plan. All questions were answered to their satisfaction.    I, Gwynneth Aliment, MD, have reviewed all documentation for this visit. The documentation on 07/19/23 for the exam, diagnosis, procedures, and orders are all accurate and complete.   IF YOU HAVE BEEN REFERRED TO A SPECIALIST, IT MAY TAKE 1-2 WEEKS TO SCHEDULE/PROCESS THE REFERRAL. IF YOU HAVE NOT HEARD FROM US/SPECIALIST IN TWO WEEKS, PLEASE GIVE [place] A CALL AT [phone].   THE PATIENT IS ENCOURAGED TO PRACTICE SOCIAL DISTANCING DUE TO THE COVID-19 PANDEMIC.

## 2023-07-19 NOTE — Patient Instructions (Signed)
Hypertension, Adult High blood pressure (hypertension) is when the force of blood pumping through the arteries is too strong. The arteries are the blood vessels that carry blood from the heart throughout the body. Hypertension forces the heart to work harder to pump blood and may cause arteries to become narrow or stiff. Untreated or uncontrolled hypertension can lead to a heart attack, heart failure, a stroke, kidney disease, and other problems. A blood pressure reading consists of a higher number over a lower number. Ideally, your blood pressure should be below 120/80. The first ("top") number is called the systolic pressure. It is a measure of the pressure in your arteries as your heart beats. The second ("bottom") number is called the diastolic pressure. It is a measure of the pressure in your arteries as the heart relaxes. What are the causes? The exact cause of this condition is not known. There are some conditions that result in high blood pressure. What increases the risk? Certain factors may make you more likely to develop high blood pressure. Some of these risk factors are under your control, including: Smoking. Not getting enough exercise or physical activity. Being overweight. Having too much fat, sugar, calories, or salt (sodium) in your diet. Drinking too much alcohol. Other risk factors include: Having a personal history of heart disease, diabetes, high cholesterol, or kidney disease. Stress. Having a family history of high blood pressure and high cholesterol. Having obstructive sleep apnea. Age. The risk increases with age. What are the signs or symptoms? High blood pressure may not cause symptoms. Very high blood pressure (hypertensive crisis) may cause: Headache. Fast or irregular heartbeats (palpitations). Shortness of breath. Nosebleed. Nausea and vomiting. Vision changes. Severe chest pain, dizziness, and seizures. How is this diagnosed? This condition is diagnosed by  measuring your blood pressure while you are seated, with your arm resting on a flat surface, your legs uncrossed, and your feet flat on the floor. The cuff of the blood pressure monitor will be placed directly against the skin of your upper arm at the level of your heart. Blood pressure should be measured at least twice using the same arm. Certain conditions can cause a difference in blood pressure between your right and left arms. If you have a high blood pressure reading during one visit or you have normal blood pressure with other risk factors, you may be asked to: Return on a different day to have your blood pressure checked again. Monitor your blood pressure at home for 1 week or longer. If you are diagnosed with hypertension, you may have other blood or imaging tests to help your health care provider understand your overall risk for other conditions. How is this treated? This condition is treated by making healthy lifestyle changes, such as eating healthy foods, exercising more, and reducing your alcohol intake. You may be referred for counseling on a healthy diet and physical activity. Your health care provider may prescribe medicine if lifestyle changes are not enough to get your blood pressure under control and if: Your systolic blood pressure is above 130. Your diastolic blood pressure is above 80. Your personal target blood pressure may vary depending on your medical conditions, your age, and other factors. Follow these instructions at home: Eating and drinking  Eat a diet that is high in fiber and potassium, and low in sodium, added sugar, and fat. An example of this eating plan is called the DASH diet. DASH stands for Dietary Approaches to Stop Hypertension. To eat this way: Eat   plenty of fresh fruits and vegetables. Try to fill one half of your plate at each meal with fruits and vegetables. Eat whole grains, such as whole-wheat pasta, brown rice, or whole-grain bread. Fill about one  fourth of your plate with whole grains. Eat or drink low-fat dairy products, such as skim milk or low-fat yogurt. Avoid fatty cuts of meat, processed or cured meats, and poultry with skin. Fill about one fourth of your plate with lean proteins, such as fish, chicken without skin, beans, eggs, or tofu. Avoid pre-made and processed foods. These tend to be higher in sodium, added sugar, and fat. Reduce your daily sodium intake. Many people with hypertension should eat less than 1,500 mg of sodium a day. Do not drink alcohol if: Your health care provider tells you not to drink. You are pregnant, may be pregnant, or are planning to become pregnant. If you drink alcohol: Limit how much you have to: 0-1 drink a day for women. 0-2 drinks a day for men. Know how much alcohol is in your drink. In the [place] one drink equals one 12 oz bottle of beer (355 mL), one 5 oz glass of wine (148 mL), or one 1 oz glass of hard liquor (44 mL). Lifestyle  Work with your health care provider to maintain a healthy body weight or to lose weight. Ask what an ideal weight is for you. Get at least 30 minutes of exercise that causes your heart to beat faster (aerobic exercise) most days of the week. Activities may include walking, swimming, or biking. Include exercise to strengthen your muscles (resistance exercise), such as Pilates or lifting weights, as part of your weekly exercise routine. Try to do these types of exercises for 30 minutes at least 3 days a week. Do not use any products that contain nicotine or tobacco. These products include cigarettes, chewing tobacco, and vaping devices, such as e-cigarettes. If you need help quitting, ask your health care provider. Monitor your blood pressure at home as told by your health care provider. Keep all follow-up visits. This is important. Medicines Take over-the-counter and prescription medicines only as told by your health care provider. Follow directions carefully. Blood  pressure medicines must be taken as prescribed. Do not skip doses of blood pressure medicine. Doing this puts you at risk for problems and can make the medicine less effective. Ask your health care provider about side effects or reactions to medicines that you should watch for. Contact a health care provider if you: Think you are having a reaction to a medicine you are taking. Have headaches that keep coming back (recurring). Feel dizzy. Have swelling in your ankles. Have trouble with your vision. Get help right away if you: Develop a severe headache or confusion. Have unusual weakness or numbness. Feel faint. Have severe pain in your chest or abdomen. Vomit repeatedly. Have trouble breathing. These symptoms may be an emergency. Get help right away. Call 911. Do not wait to see if the symptoms will go away. Do not drive yourself to the hospital. Summary Hypertension is when the force of blood pumping through your arteries is too strong. If this condition is not controlled, it may put you at risk for serious complications. Your personal target blood pressure may vary depending on your medical conditions, your age, and other factors. For most people, a normal blood pressure is less than 120/80. Hypertension is treated with lifestyle changes, medicines, or a combination of both. Lifestyle changes include losing weight, eating a healthy,   low-sodium diet, exercising more, and limiting alcohol. This information is not intended to replace advice given to you by your health care provider. Make sure you discuss any questions you have with your health care provider. Document Revised: 08/31/2021 Document Reviewed: 08/31/2021 Elsevier Patient Education  2024 [employer] Inc[employer]  

## 2023-07-20 LAB — CBC
Hematocrit: 29.2 % — ABNORMAL LOW (ref 34.0–46.6)
Hemoglobin: 8.4 g/dL — ABNORMAL LOW (ref 11.1–15.9)
MCH: 20.3 pg — ABNORMAL LOW (ref 26.6–33.0)
MCHC: 28.8 g/dL — ABNORMAL LOW (ref 31.5–35.7)
MCV: 71 fL — ABNORMAL LOW (ref 79–97)
Platelets: 573 10*3/uL — ABNORMAL HIGH (ref 150–450)
RBC: 4.13 x10E6/uL (ref 3.77–5.28)
RDW: 18 % — ABNORMAL HIGH (ref 11.7–15.4)
WBC: 6.5 10*3/uL (ref 3.4–10.8)

## 2023-07-20 LAB — IRON,TIBC AND FERRITIN PANEL
Ferritin: 6 ng/mL — ABNORMAL LOW (ref 15–150)
Iron Saturation: 5 % — CL (ref 15–55)
Iron: 19 ug/dL — ABNORMAL LOW (ref 27–159)
Total Iron Binding Capacity: 359 ug/dL (ref 250–450)
UIBC: 340 ug/dL (ref 131–425)

## 2023-07-23 ENCOUNTER — Other Ambulatory Visit (HOSPITAL_COMMUNITY): Payer: Self-pay

## 2023-07-23 MED ORDER — ESCITALOPRAM OXALATE 20 MG PO TABS
20.0000 mg | ORAL_TABLET | Freq: Every day | ORAL | 3 refills | Status: DC
  Filled 2023-07-23: qty 30, 30d supply, fill #0

## 2023-07-23 MED ORDER — ZOLPIDEM TARTRATE 10 MG PO TABS
10.0000 mg | ORAL_TABLET | Freq: Every day | ORAL | 3 refills | Status: AC
  Filled 2023-07-23: qty 30, 30d supply, fill #0

## 2023-07-24 ENCOUNTER — Other Ambulatory Visit (HOSPITAL_COMMUNITY): Payer: Self-pay

## 2023-07-29 DIAGNOSIS — E6609 Other obesity due to excess calories: Secondary | ICD-10-CM | POA: Insufficient documentation

## 2023-07-29 DIAGNOSIS — E66812 Obesity, class 2: Secondary | ICD-10-CM | POA: Insufficient documentation

## 2023-07-29 DIAGNOSIS — E66811 Obesity, class 1: Secondary | ICD-10-CM | POA: Insufficient documentation

## 2023-07-29 DIAGNOSIS — F419 Anxiety disorder, unspecified: Secondary | ICD-10-CM | POA: Insufficient documentation

## 2023-07-29 NOTE — Assessment & Plan Note (Signed)
She has requested increase in Wegovy dose, will send 2.4mg  dose to the pharmacy. She will f/u in 8 weeks for re-evaluation.

## 2023-07-29 NOTE — Assessment & Plan Note (Signed)
Chronic, diastolic elevation noted. She will continue with valsartan 80mg , hct 12.5mg  daily. May need to consider increasing valsartan in the future. She recently started walking for exercise, I expect her BP to furher improve. Will recheck in 4-6 months.

## 2023-07-29 NOTE — Assessment & Plan Note (Signed)
Chronic, exacerbated by menorrhagia and non-compliance with iron supplementation. She declines iron infusion.

## 2023-07-29 NOTE — Assessment & Plan Note (Signed)
Chronic, med mgmt as per Dr. Jacqulyn Bath. She will continue with escitalopram daily.

## 2023-07-29 NOTE — Assessment & Plan Note (Signed)
Chronic, encouraged to stay well hydrated.

## 2023-08-03 ENCOUNTER — Other Ambulatory Visit (HOSPITAL_COMMUNITY): Payer: Self-pay

## 2023-08-09 ENCOUNTER — Other Ambulatory Visit (HOSPITAL_COMMUNITY): Payer: Self-pay

## 2023-08-09 ENCOUNTER — Encounter: Payer: Self-pay | Admitting: Dermatology

## 2023-08-09 ENCOUNTER — Ambulatory Visit: Payer: BC Managed Care – PPO | Admitting: Dermatology

## 2023-08-09 VITALS — BP 119/80 | HR 100

## 2023-08-09 DIAGNOSIS — L810 Postinflammatory hyperpigmentation: Secondary | ICD-10-CM

## 2023-08-09 DIAGNOSIS — L700 Acne vulgaris: Secondary | ICD-10-CM | POA: Diagnosis not present

## 2023-08-09 MED ORDER — TRETINOIN 0.025 % EX CREA
1.0000 | TOPICAL_CREAM | CUTANEOUS | 3 refills | Status: DC
  Filled 2023-08-09: qty 45, 30d supply, fill #0

## 2023-08-09 NOTE — Progress Notes (Unsigned)
   New Patient Visit   Subjective  Angel Byrd is a 27 y.o. female who presents for the following: Acne Vulgaris  Pt had severe acne breakout on the face a few months ago that she is here today to discuss. She states the breakout lasted several months. She tried [place] Rx brand to try and calm things but it didn't help and the spots would get red and tender. She saw her PCP who didn't give her anything for the acne. She had used Cerave cleanser for a while but it seemed to make things worse so she currently only uses plain water to clean face.   The following portions of the chart were reviewed this encounter and updated as appropriate: medications, allergies, medical history  Review of Systems:  No other skin or systemic complaints except as noted in HPI or Assessment and Plan.  Objective  Well appearing patient in no apparent distress; mood and affect are within normal limits.  Areas Examined: Face, chest and back  Relevant exam findings are noted in the Assessment and Plan.  Picture provided  Exam: Open comedones and inflammatory papules  Exam: hyperpigmented macules and/or patches at face        Assessment & Plan    ACNE VULGARIS  Chronic and persistent condition with duration or expected duration over one year. Condition is symptomatic/ bothersome to patient. Not currently at goal.   Treatment Plan: -Wash with Cerave hydrating cleanser in the morning -Apply hyaluronic acid twice a day and Cerave cream if skin feels dry -Start Tretinoin 0.025% apply to face 3 times a week  Avoid using ointments on face to prevent flare up  POST-INFLAMMATORY HYPERPIGMENTATION (PIH)   This is a benign condition that comes from having previous inflammation in the skin and will fade with time over months to sometimes years. Recommend daily sun protection including sunscreen SPF 30+ to sun-exposed areas. - Recommend treating any itchy or red areas on the skin quickly to prevent new  areas of PIH. Treating with prescription medicines such as hydroquinone may help fade dark spots faster.    Samples given of cerave hydrating cleanser, cerave moisturizer and LaRoche Posay moisturizer  Treatment Plan: -topical retinoid started today will help lift some excess pigment -once skin is acclimated to retinoid we will add in a separate topical lightening agent -recommended broad spectrum SPF30 every day, hats and sun avoidence.    No follow-ups on file.  Owens Shark, CMA, am acting as scribe for [employer], DO.   Documentation: I have reviewed the above documentation for accuracy and completeness, and I agree with the above.  Langston Reusing, DO

## 2023-08-09 NOTE — Patient Instructions (Addendum)
Hello Angel Byrd,  Thank you for visiting [place] today. Your dedication to enhancing your skin health is greatly appreciated. Below is a summary of the essential instructions from today's consultation:  - Medication: Start using Tretinoin cream 0.025% three nights a week (Monday, Wednesday, Friday). Begin with this frequency and adjust according to your skin's tolerance.  - Cleansing and Hydration:   - Morning Routine:     - Wash your face with CeraVe Hydrating Cleanser.     - Apply Vichy Hyaluronic Acid.     - If your skin feels dry, add CeraVe cream.   - Evening Routine (On Tretinoin Days):     - Wash your face.     - Apply Vichy Hyaluronic Acid.     - Use a pea-sized amount of Tretinoin cream.     - Follow up with a moisturizer.  - Precautions:   - Avoid using CeraVe ointment on your face to prevent clogging pores.   - Do not apply Tretinoin cream around your eyes, lips, and neck.  - Follow-Up: We will schedule a follow-up appointment in three months to evaluate your progress and possibly introduce additional treatments.  - Samples Provided: You have been given samples of CeraVe Hydrating Cleanser, CeraVe cream, and other recommended moisturizers.  Please adhere to the prescribed regimen and monitor how your skin reacts. Should you encounter any issues or have any questions, do not hesitate to send a message through MyChart.  We are looking forward to witnessing the improvements at your next visit.  Best regards,  Dr. [facility]           Important Information   Due to recent changes in healthcare laws, you may see results of your pathology and/or laboratory studies on MyChart before the doctors have had a chance to review them. We understand that in some cases there may be results that are confusing or concerning to you. Please understand that not all results are received at the same time and often the doctors may need to interpret multiple results in order  to provide you with the best plan of care or course of treatment. Therefore, we ask that you please give [place] 2 business days to thoroughly review all your results before contacting the office for clarification. Should we see a critical lab result, you will be contacted sooner.     If You Need Anything After Your Visit   If you have any questions or concerns for your doctor, please call our main line at [phone]. If no one answers, please leave a voicemail as directed and we will return your call as soon as possible. Messages left after 4 pm will be answered the following business day.    You may also send [place] a message via MyChart. We typically respond to MyChart messages within 1-2 business days.  For prescription refills, please ask your pharmacy to contact our office. Our fax number is [phone].  If you have an urgent issue when the clinic is closed that cannot wait until the next business day, you can page your doctor at the number below.     Please note that while we do our best to be available for urgent issues outside of office hours, we are not available 24/7.    If you have an urgent issue and are unable to reach [place], you may choose to seek medical care at your doctor's office, retail clinic, urgent care center, or emergency room.   If you have a  medical emergency, please immediately call 911 or go to the emergency department. In the event of inclement weather, please call our main line at [phone] for an update on the status of any delays or closures.  Dermatology Medication Tips: Please keep the boxes that topical medications come in in order to help keep track of the instructions about where and how to use these. Pharmacies typically print the medication instructions only on the boxes and not directly on the medication tubes.   If your medication is too expensive, please contact our office at [phone] or send [place] a message through MyChart.    We are unable to tell  what your co-pay for medications will be in advance as this is different depending on your insurance coverage. However, we may be able to find a substitute medication at lower cost or fill out paperwork to get insurance to cover a needed medication.    If a prior authorization is required to get your medication covered by your insurance company, please allow [place] 1-2 business days to complete this process.   Drug prices often vary depending on where the prescription is filled and some pharmacies may offer cheaper prices.   The website www.goodrx.com contains coupons for medications through different pharmacies. The prices here do not account for what the cost may be with help from insurance (it may be cheaper with your insurance), but the website can give you the price if you did not use any insurance.  - You can print the associated coupon and take it with your prescription to the pharmacy.  - You may also stop by our office during regular business hours and pick up a GoodRx coupon card.  - If you need your prescription sent electronically to a different pharmacy, notify our office through [facility] or by phone at [phone]

## 2023-08-15 ENCOUNTER — Other Ambulatory Visit: Payer: BC Managed Care – PPO

## 2023-08-15 ENCOUNTER — Ambulatory Visit: Payer: BC Managed Care – PPO

## 2023-08-15 VITALS — BP 130/90 | HR 91 | Ht 61.0 in | Wt 186.0 lb

## 2023-08-15 DIAGNOSIS — I10 Essential (primary) hypertension: Secondary | ICD-10-CM

## 2023-08-15 NOTE — Progress Notes (Signed)
Patient presents today for a bp check. Patient reports she is taking valsartan 160mg  in the mornings. Patient reported she stopped taking the hydrochlorothiazide 12.5mg  last week because it was causing her head to hurt. I checked her blood pressure it was 130/90 P91. I waited 10 minutes and it was 120/90 patient admitted she has not been taking the amlodipine 5mg . She stated she forgot. Patient was advised to take valsartan 160mg  in the mornings and amlodpine 5mg  in the evenings at bedtime. She was advised to schedule a 2 week f/u [place] for Bpc.     BP Readings from Last 3 Encounters:  08/09/23 119/80  07/19/23 126/88  04/05/23 130/88

## 2023-08-16 LAB — BMP8+EGFR
BUN/Creatinine Ratio: 8 — ABNORMAL LOW (ref 9–23)
BUN: 6 mg/dL (ref 6–20)
CO2: 17 mmol/L — ABNORMAL LOW (ref 20–29)
Calcium: 8.7 mg/dL (ref 8.7–10.2)
Chloride: 103 mmol/L (ref 96–106)
Creatinine, Ser: 0.78 mg/dL (ref 0.57–1.00)
Glucose: 89 mg/dL (ref 70–99)
Potassium: 3.9 mmol/L (ref 3.5–5.2)
Sodium: 137 mmol/L (ref 134–144)
eGFR: 105 mL/min/{1.73_m2} (ref >59)

## 2023-08-21 ENCOUNTER — Other Ambulatory Visit (HOSPITAL_COMMUNITY): Payer: Self-pay

## 2023-08-21 DIAGNOSIS — F411 Generalized anxiety disorder: Secondary | ICD-10-CM | POA: Diagnosis not present

## 2023-08-21 MED ORDER — ESCITALOPRAM OXALATE 20 MG PO TABS
20.0000 mg | ORAL_TABLET | Freq: Every day | ORAL | 3 refills | Status: DC
  Filled 2023-08-21: qty 30, 30d supply, fill #0

## 2023-08-21 MED ORDER — ZALEPLON 10 MG PO CAPS
10.0000 mg | ORAL_CAPSULE | Freq: Every day | ORAL | 3 refills | Status: DC
Start: 2023-08-21 — End: 2024-04-08
  Filled 2023-08-21: qty 30, 30d supply, fill #0

## 2023-08-29 ENCOUNTER — Other Ambulatory Visit (HOSPITAL_COMMUNITY): Payer: Self-pay

## 2023-08-29 ENCOUNTER — Ambulatory Visit: Payer: BC Managed Care – PPO

## 2023-08-29 ENCOUNTER — Other Ambulatory Visit: Payer: Self-pay

## 2023-08-29 ENCOUNTER — Other Ambulatory Visit: Payer: Self-pay | Admitting: Internal Medicine

## 2023-08-29 VITALS — BP 130/88 | HR 95 | Temp 98.1°F | Ht 61.0 in | Wt 184.0 lb

## 2023-08-29 DIAGNOSIS — I10 Essential (primary) hypertension: Secondary | ICD-10-CM

## 2023-08-29 MED ORDER — AMLODIPINE BESYLATE 5 MG PO TABS
5.0000 mg | ORAL_TABLET | Freq: Every day | ORAL | 2 refills | Status: DC
Start: 2023-08-29 — End: 2024-06-24
  Filled 2023-08-29: qty 30, 30d supply, fill #0
  Filled 2023-10-10: qty 30, 30d supply, fill #1
  Filled 2023-11-15 – 2023-11-17 (×2): qty 30, 30d supply, fill #2
  Filled 2023-12-26: qty 30, 30d supply, fill #3
  Filled 2024-01-23: qty 30, 30d supply, fill #4
  Filled 2024-02-28: qty 30, 30d supply, fill #5
  Filled 2024-03-29 (×2): qty 30, 30d supply, fill #6
  Filled 2024-05-03: qty 30, 30d supply, fill #7
  Filled 2024-06-03: qty 30, 30d supply, fill #8

## 2023-08-29 NOTE — Patient Instructions (Signed)
Hypertension, Adult Hypertension is another name for high blood pressure. High blood pressure forces your heart to work harder to pump blood. This can cause problems over time. There are two numbers in a blood pressure reading. There is a top number (systolic) over a bottom number (diastolic). It is best to have a blood pressure that is below 120/80. What are the causes? The cause of this condition is not known. Some other conditions can lead to high blood pressure. What increases the risk? Some lifestyle factors can make you more likely to develop high blood pressure: Smoking. Not getting enough exercise or physical activity. Being overweight. Having too much fat, sugar, calories, or salt (sodium) in your diet. Drinking too much alcohol. Other risk factors include: Having any of these conditions: Heart disease. Diabetes. High cholesterol. Kidney disease. Obstructive sleep apnea. Having a family history of high blood pressure and high cholesterol. Age. The risk increases with age. Stress. What are the signs or symptoms? High blood pressure may not cause symptoms. Very high blood pressure (hypertensive crisis) may cause: Headache. Fast or uneven heartbeats (palpitations). Shortness of breath. Nosebleed. Vomiting or feeling like you may vomit (nauseous). Changes in how you see. Very bad chest pain. Feeling dizzy. Seizures. How is this treated? This condition is treated by making healthy lifestyle changes, such as: Eating healthy foods. Exercising more. Drinking less alcohol. Your doctor may prescribe medicine if lifestyle changes do not help enough and if: Your top number is above 130. Your bottom number is above 80. Your personal target blood pressure may vary. Follow these instructions at home: Eating and drinking  If told, follow the DASH eating plan. To follow this plan: Fill one half of your plate at each meal with fruits and vegetables. Fill one fourth of your plate  at each meal with whole grains. Whole grains include whole-wheat pasta, brown rice, and whole-grain bread. Eat or drink low-fat dairy products, such as skim milk or low-fat yogurt. Fill one fourth of your plate at each meal with low-fat (lean) proteins. Low-fat proteins include fish, chicken without skin, eggs, beans, and tofu. Avoid fatty meat, cured and processed meat, or chicken with skin. Avoid pre-made or processed food. Limit the amount of salt in your diet to less than 1,500 mg each day. Do not drink alcohol if: Your doctor tells you not to drink. You are pregnant, may be pregnant, or are planning to become pregnant. If you drink alcohol: Limit how much you have to: 0-1 drink a day for women. 0-2 drinks a day for men. Know how much alcohol is in your drink. In the [place] one drink equals one 12 oz bottle of beer (355 mL), one 5 oz glass of wine (148 mL), or one 1 oz glass of hard liquor (44 mL). Lifestyle  Work with your doctor to stay at a healthy weight or to lose weight. Ask your doctor what the best weight is for you. Get at least 30 minutes of exercise that causes your heart to beat faster (aerobic exercise) most days of the week. This may include walking, swimming, or biking. Get at least 30 minutes of exercise that strengthens your muscles (resistance exercise) at least 3 days a week. This may include lifting weights or doing Pilates. Do not smoke or use any products that contain nicotine or tobacco. If you need help quitting, ask your doctor. Check your blood pressure at home as told by your doctor. Keep all follow-up visits. Medicines Take over-the-counter and prescription medicines   only as told by your doctor. Follow directions carefully. Do not skip doses of blood pressure medicine. The medicine does not work as well if you skip doses. Skipping doses also puts you at risk for problems. Ask your doctor about side effects or reactions to medicines that you should watch  for. Contact a doctor if: You think you are having a reaction to the medicine you are taking. You have headaches that keep coming back. You feel dizzy. You have swelling in your ankles. You have trouble with your vision. Get help right away if: You get a very bad headache. You start to feel mixed up (confused). You feel weak or numb. You feel faint. You have very bad pain in your: Chest. Belly (abdomen). You vomit more than once. You have trouble breathing. These symptoms may be an emergency. Get help right away. Call 911. Do not wait to see if the symptoms will go away. Do not drive yourself to the hospital. Summary Hypertension is another name for high blood pressure. High blood pressure forces your heart to work harder to pump blood. For most people, a normal blood pressure is less than 120/80. Making healthy choices can help lower blood pressure. If your blood pressure does not get lower with healthy choices, you may need to take medicine. This information is not intended to replace advice given to you by your health care provider. Make sure you discuss any questions you have with your health care provider. Document Revised: 08/12/2021 Document Reviewed: 08/12/2021 Elsevier Patient Education  2024 [employer]  

## 2023-08-29 NOTE — Progress Notes (Signed)
Patient presents today for bpc. She no longer takes hydrochlorothiazide 12.5. Valsartan 80MG  she takes in the morning & Amlodipine 5 MG she reports taking at night. She reports taking Amlodipine 5mg  this morning, she has not taken the Valsartan 80MG  yet today. She admits she does better with taking one medication than two. She admits when she takes two bp medications she experiences headache. Currently she denies headache, chest pain & sob.  BP Readings from Last 3 Encounters:  08/29/23 130/88  08/15/23 (!) 130/90  08/09/23 119/80  Initial bp: 132/96. Per provider patient will be referred to HTN clinic. No follow up scheduled.

## 2023-08-31 ENCOUNTER — Other Ambulatory Visit (HOSPITAL_COMMUNITY): Payer: Self-pay

## 2023-08-31 DIAGNOSIS — Z01419 Encounter for gynecological examination (general) (routine) without abnormal findings: Secondary | ICD-10-CM | POA: Diagnosis not present

## 2023-08-31 DIAGNOSIS — Z3040 Encounter for surveillance of contraceptives, unspecified: Secondary | ICD-10-CM | POA: Diagnosis not present

## 2023-08-31 DIAGNOSIS — Z113 Encounter for screening for infections with a predominantly sexual mode of transmission: Secondary | ICD-10-CM | POA: Diagnosis not present

## 2023-08-31 MED ORDER — NORETHIN ACE-ETH ESTRAD-FE 1-20 MG-MCG PO TABS
1.0000 | ORAL_TABLET | Freq: Every day | ORAL | 4 refills | Status: AC
  Filled 2023-08-31: qty 28, 28d supply, fill #0
  Filled 2024-01-08 (×2): qty 28, 28d supply, fill #1

## 2023-09-20 ENCOUNTER — Other Ambulatory Visit (HOSPITAL_COMMUNITY): Payer: Self-pay

## 2023-10-10 ENCOUNTER — Other Ambulatory Visit (HOSPITAL_COMMUNITY): Payer: Self-pay

## 2023-10-10 ENCOUNTER — Encounter (HOSPITAL_COMMUNITY): Payer: Self-pay

## 2023-10-10 ENCOUNTER — Other Ambulatory Visit: Payer: Self-pay

## 2023-10-14 ENCOUNTER — Other Ambulatory Visit (HOSPITAL_COMMUNITY): Payer: Self-pay

## 2023-10-16 ENCOUNTER — Other Ambulatory Visit (HOSPITAL_COMMUNITY): Payer: Self-pay

## 2023-10-18 ENCOUNTER — Other Ambulatory Visit: Payer: Self-pay

## 2023-11-09 ENCOUNTER — Ambulatory Visit: Payer: BC Managed Care – PPO | Admitting: Dermatology

## 2023-11-15 ENCOUNTER — Other Ambulatory Visit (HOSPITAL_COMMUNITY): Payer: Self-pay

## 2023-11-16 ENCOUNTER — Ambulatory Visit: Payer: BC Managed Care – PPO | Admitting: Dermatology

## 2023-11-17 ENCOUNTER — Other Ambulatory Visit (HOSPITAL_COMMUNITY): Payer: Self-pay

## 2023-11-22 ENCOUNTER — Other Ambulatory Visit: Payer: Self-pay | Admitting: Internal Medicine

## 2023-11-22 ENCOUNTER — Encounter: Payer: Self-pay | Admitting: Internal Medicine

## 2023-11-22 ENCOUNTER — Ambulatory Visit (INDEPENDENT_AMBULATORY_CARE_PROVIDER_SITE_OTHER): Payer: BC Managed Care – PPO | Admitting: Internal Medicine

## 2023-11-22 VITALS — BP 118/82 | HR 87 | Temp 98.5°F | Ht 61.0 in | Wt 180.4 lb

## 2023-11-22 DIAGNOSIS — Z0000 Encounter for general adult medical examination without abnormal findings: Secondary | ICD-10-CM | POA: Diagnosis not present

## 2023-11-22 DIAGNOSIS — E66811 Obesity, class 1: Secondary | ICD-10-CM

## 2023-11-22 DIAGNOSIS — I10 Essential (primary) hypertension: Secondary | ICD-10-CM | POA: Diagnosis not present

## 2023-11-22 DIAGNOSIS — D573 Sickle-cell trait: Secondary | ICD-10-CM

## 2023-11-22 DIAGNOSIS — Z6834 Body mass index (BMI) 34.0-34.9, adult: Secondary | ICD-10-CM

## 2023-11-22 DIAGNOSIS — E6609 Other obesity due to excess calories: Secondary | ICD-10-CM

## 2023-11-22 LAB — POCT URINALYSIS DIPSTICK
Bilirubin, UA: NEGATIVE
Glucose, UA: NEGATIVE
Ketones, UA: NEGATIVE
Leukocytes, UA: NEGATIVE
Nitrite, UA: NEGATIVE
Protein, UA: POSITIVE — AB
Spec Grav, UA: 1.025 (ref 1.010–1.025)
Urobilinogen, UA: 0.2 U/dL
pH, UA: 5.5 (ref 5.0–8.0)

## 2023-11-22 LAB — CBC
Hematocrit: 27 % — ABNORMAL LOW (ref 34.0–46.6)
Hemoglobin: 7.7 g/dL — ABNORMAL LOW (ref 11.1–15.9)
MCH: 18.5 pg — ABNORMAL LOW (ref 26.6–33.0)
MCHC: 28.5 g/dL — ABNORMAL LOW (ref 31.5–35.7)
MCV: 65 fL — ABNORMAL LOW (ref 79–97)
Platelets: 472 10*3/uL — ABNORMAL HIGH (ref 150–450)
RBC: 4.17 x10E6/uL (ref 3.77–5.28)
RDW: 18.4 % — ABNORMAL HIGH (ref 11.7–15.4)
WBC: 5.8 10*3/uL (ref 3.4–10.8)

## 2023-11-22 NOTE — Patient Instructions (Signed)

## 2023-11-22 NOTE — Assessment & Plan Note (Signed)
 She will continue with Wegovy  2.4mg  dose, will send 2.4mg  dose to the pharmacy. She will f/u in 8-10 weeks for re-evaluation.

## 2023-11-22 NOTE — Progress Notes (Signed)
 I,Victoria T Basil Lim, CMA,acting as a Neurosurgeon for Smiley Dung, MD.,have documented all relevant documentation on the behalf of Smiley Dung, MD,as directed by  Smiley Dung, MD while in the presence of Smiley Dung, MD.  Subjective:    Patient ID: Angel Byrd , female    DOB: Mar 20, 1996 , 28 y.o.   MRN: [id number]  Chief Complaint  Patient presents with   Annual Exam   Hypertension    HPI  The patient is here today for a physical examination. She is followed by [facility] for her GYN care. She reports compliance with medications. She denies having any chest pain, shortness of breath and headaches.  Since her last visit, she states she has made some dietary changes -  decreasing her intake of processed foods.   She has appointment scheduled with HTN Clinic at the end of month.    Hypertension This is a chronic problem. The current episode started more than 1 year ago. The problem has been gradually improving since onset. The problem is controlled. Pertinent negatives include no blurred vision. Risk factors for coronary artery disease include obesity and sedentary lifestyle. Past treatments include beta blockers and calcium  channel blockers. The current treatment provides moderate improvement. Compliance problems include exercise.      Past Medical History:  Diagnosis Date   HTN (hypertension)    Iron  deficiency anemia      Family History  Problem Relation Age of Onset   Sickle cell anemia Mother    Heart disease Father    Hypertension Sister      Current Outpatient Medications:    amLODipine  (NORVASC ) 5 MG tablet, Take 1 tablet (5 mg total) by mouth at bedtime., Disp: 90 tablet, Rfl: 2   escitalopram  (LEXAPRO ) 10 MG tablet, Take 1 tablet (10 mg total) by mouth daily., Disp: 90 tablet, Rfl: 1   norethindrone -ethinyl estradiol -FE (JUNEL  FE 1/20) 1-20 MG-MCG tablet, Take 1 tablet by mouth daily., Disp: 84 tablet, Rfl: 4   Semaglutide -Weight Management (WEGOVY ) 2.4  MG/0.75ML SOAJ, Inject 2.4 mg into the skin once a week., Disp: 3 mL, Rfl: 3   tretinoin  (RETIN-A ) 0.025 % cream, Apply to face 3 nights a week, Disp: 45 g, Rfl: 3   valsartan  (DIOVAN ) 80 MG tablet, Take 1 tablet (80 mg total) by mouth daily., Disp: 90 tablet, Rfl: 2   zaleplon  (SONATA ) 10 MG capsule, Take 1 capsule (10 mg total) by mouth daily., Disp: 30 capsule, Rfl: 3   zolpidem  (AMBIEN ) 10 MG tablet, Take 1 tablet (10 mg total) by mouth daily., Disp: 30 tablet, Rfl: 3   hydrochlorothiazide  (MICROZIDE ) 12.5 MG capsule, Take 1 capsule (12.5 mg total) by mouth daily. (Patient not taking: Reported on 11/22/2023), Disp: 90 capsule, Rfl: 2   Iron -FA-B Cmp-C-Biot-Probiotic (FUSION PLUS) CAPS, Take 1 capsule by mouth daily. (Patient not taking: Reported on 11/22/2023), Disp: 30 capsule, Rfl: 3   No Known Allergies    The patient states she uses none for birth control. Patient's last menstrual period was 10/09/2023 (exact date).. Negative for Dysmenorrhea. Negative for: breast discharge, breast lump(s), breast pain and breast self exam. Associated symptoms include abnormal vaginal bleeding. Pertinent negatives include abnormal bleeding (hematology), anxiety, decreased libido, depression, difficulty falling sleep, dyspareunia, history of infertility, nocturia, sexual dysfunction, sleep disturbances, urinary incontinence, urinary urgency, vaginal discharge and vaginal itching. Diet regular.The patient states her exercise level is  intermittent.  . The patient's tobacco use is:  Social History   Tobacco Use  Smoking  Status Never  Smokeless Tobacco Never  . She has been exposed to passive smoke. The patient's alcohol use is:  Social History   Substance and Sexual Activity  Alcohol Use No     Review of Systems  Constitutional: Negative.   HENT: Negative.    Eyes: Negative.  Negative for blurred vision.  Respiratory: Negative.    Cardiovascular: Negative.   Gastrointestinal: Negative.   Endocrine:  Negative.   Genitourinary: Negative.   Musculoskeletal: Negative.   Skin: Negative.   Allergic/Immunologic: Negative.   Neurological: Negative.   Hematological: Negative.   Psychiatric/Behavioral: Negative.       Today's Vitals   11/22/23 1030  BP: 118/82  Pulse: 87  Temp: 98.5 F (36.9 C)  SpO2: 98%  Weight: 180 lb 6.4 oz (81.8 kg)  Height: 5\' 1"  (1.549 m)   Body mass index is 34.09 kg/m.  Wt Readings from Last 3 Encounters:  11/22/23 180 lb 6.4 oz (81.8 kg)  08/29/23 184 lb (83.5 kg)  08/15/23 186 lb (84.4 kg)    BP Readings from Last 3 Encounters:  11/22/23 118/82  08/29/23 130/88  08/15/23 (!) 130/90    Objective:  Physical Exam Vitals and nursing note reviewed.  Constitutional:      Appearance: Normal appearance. She is obese.  HENT:     Head: Normocephalic and atraumatic.     Right Ear: Tympanic membrane, ear canal and external ear normal.     Left Ear: Tympanic membrane, ear canal and external ear normal.     Nose: Nose normal.     Mouth/Throat:     Mouth: Mucous membranes are moist.     Pharynx: Oropharynx is clear.  Eyes:     Extraocular Movements: Extraocular movements intact.     Conjunctiva/sclera: Conjunctivae normal.     Pupils: Pupils are equal, round, and reactive to light.  Cardiovascular:     Rate and Rhythm: Normal rate and regular rhythm.     Pulses: Normal pulses.     Heart sounds: Normal heart sounds.  Pulmonary:     Effort: Pulmonary effort is normal.     Breath sounds: Normal breath sounds.  Chest:  Breasts:    Tanner Score is 5.     Right: Normal.     Left: Normal.  Abdominal:     General: Abdomen is flat. Bowel sounds are normal.     Palpations: Abdomen is soft.  Genitourinary:    Comments: deferred Musculoskeletal:        General: Normal range of motion.     Cervical back: Normal range of motion and neck supple.  Skin:    General: Skin is warm and dry.  Neurological:     General: No focal deficit present.     Mental  Status: She is alert and oriented to person, place, and time.  Psychiatric:        Mood and Affect: Mood normal.        Behavior: Behavior normal.         Assessment And Plan:     Encounter for general adult medical examination w/o abnormal findings Assessment & Plan: A full exam was performed.  Importance of monthly self breast exams was discussed with the patient.  She is advised to get 30-45 minutes of regular exercise, no less than four to five days per week. Both weight-bearing and aerobic exercises are recommended.  She is advised to follow a healthy diet with at least six fruits/veggies per day, decrease intake  of red meat and other saturated fats and to increase fish intake to twice weekly.  Meats/fish should not be fried -- baked, boiled or broiled is preferable. It is also important to cut back on your sugar intake.  Be sure to read labels - try to avoid anything with added sugar, high fructose corn syrup or other sweeteners.  If you must use a sweetener, you can try stevia or monkfruit.  It is also important to avoid artificially sweetened foods/beverages and diet drinks. Lastly, wear SPF 50 sunscreen on exposed skin and when in direct sunlight for an extended period of time.  Be sure to avoid fast food restaurants and aim for at least 60 ounces of water daily.       Orders: -     CBC -     CMP14+EGFR -     Lipid panel -     TSH  Essential hypertension, benign Assessment & Plan: Chronic, slight diastolic elevation note; however this has improved.  She was congratulated on her dietary changes. EKG performed, NSR w/ voltage criteria for LVH. She will continue with valsartan  80mg , hct 12.5mg  daily.  She agrees to f/u in six months. She will reschedule her HTN clinic appt for another 8 weeks out. She is encouraged to aim for at least 150 minutes of exercise per week.   Orders: -     POCT urinalysis dipstick -     Microalbumin / creatinine urine ratio -     EKG 12-Lead  Sickle  cell trait (HCC) Assessment & Plan: Chronic, encouraged to stay well hydrated. She is currently asymptomatic.    Class 1 obesity due to excess calories with serious comorbidity and body mass index (BMI) of 34.0 to 34.9 in adult Assessment & Plan: She will continue with Wegovy  2.4mg  dose, will send 2.4mg  dose to the pharmacy. She will f/u in 8-10 weeks for re-evaluation.    She is encouraged to strive for BMI less than 30 to decrease cardiac risk. Advised to aim for at least 150 minutes of exercise per week.    Return in 3 months (on 02/20/2024), or 7, for 1 year physical. Patient was given opportunity to ask questions. Patient verbalized understanding of the plan and was able to repeat key elements of the plan. All questions were answered to their satisfaction.    I, Smiley Dung, MD, have reviewed all documentation for this visit. The documentation on 11/22/23 for the exam, diagnosis, procedures, and orders are all accurate and complete.

## 2023-11-22 NOTE — Assessment & Plan Note (Addendum)
 Chronic, slight diastolic elevation note; however this has improved.  She was congratulated on her dietary changes. EKG performed, NSR w/ voltage criteria for LVH. She will continue with valsartan  80mg , hct 12.5mg  daily.  She agrees to f/u in six months. She will reschedule her HTN clinic appt for another 8 weeks out. She is encouraged to aim for at least 150 minutes of exercise per week.

## 2023-11-22 NOTE — Assessment & Plan Note (Signed)

## 2023-11-22 NOTE — Assessment & Plan Note (Addendum)
 Chronic, encouraged to stay well hydrated. She is currently asymptomatic.

## 2023-11-23 LAB — CMP14+EGFR
ALT: 5 [IU]/L (ref 0–32)
AST: 12 [IU]/L (ref 0–40)
Albumin: 4.5 g/dL (ref 4.0–5.0)
Alkaline Phosphatase: 61 [IU]/L (ref 44–121)
BUN/Creatinine Ratio: 11 (ref 9–23)
BUN: 7 mg/dL (ref 6–20)
Bilirubin Total: 0.5 mg/dL (ref 0.0–1.2)
CO2: 20 mmol/L (ref 20–29)
Calcium: 9.2 mg/dL (ref 8.7–10.2)
Chloride: 103 mmol/L (ref 96–106)
Creatinine, Ser: 0.65 mg/dL (ref 0.57–1.00)
Globulin, Total: 3.1 g/dL (ref 1.5–4.5)
Glucose: 58 mg/dL — ABNORMAL LOW (ref 70–99)
Potassium: 4 mmol/L (ref 3.5–5.2)
Sodium: 140 mmol/L (ref 134–144)
Total Protein: 7.6 g/dL (ref 6.0–8.5)
eGFR: 122 mL/min/{1.73_m2} (ref >59)

## 2023-11-23 LAB — MICROALBUMIN / CREATININE URINE RATIO
Creatinine, Urine: 293.7 mg/dL
Microalb/Creat Ratio: 22 mg/g{creat} (ref 0–29)
Microalbumin, Urine: 63.5 ug/mL

## 2023-11-23 LAB — TSH: TSH: 0.968 u[IU]/mL (ref 0.450–4.500)

## 2023-11-23 LAB — LIPID PANEL
Chol/HDL Ratio: 3.1 {ratio} (ref 0.0–4.4)
Cholesterol, Total: 160 mg/dL (ref 100–199)
HDL: 51 mg/dL (ref >39)
LDL Chol Calc (NIH): 96 mg/dL (ref 0–99)
Triglycerides: 69 mg/dL (ref 0–149)
VLDL Cholesterol Cal: 13 mg/dL (ref 5–40)

## 2023-11-24 ENCOUNTER — Other Ambulatory Visit (HOSPITAL_COMMUNITY): Payer: Self-pay

## 2023-11-24 MED ORDER — WEGOVY 2.4 MG/0.75ML ~~LOC~~ SOAJ
2.4000 mg | SUBCUTANEOUS | 3 refills | Status: DC
  Filled 2023-11-24: qty 3, 28d supply, fill #0
  Filled 2023-12-21: qty 3, 28d supply, fill #1
  Filled 2024-01-16: qty 3, 28d supply, fill #2
  Filled 2024-02-16: qty 3, 28d supply, fill #3

## 2023-11-28 ENCOUNTER — Encounter: Payer: Self-pay | Admitting: Internal Medicine

## 2023-11-28 ENCOUNTER — Other Ambulatory Visit: Payer: Self-pay | Admitting: Internal Medicine

## 2023-11-28 DIAGNOSIS — D500 Iron deficiency anemia secondary to blood loss (chronic): Secondary | ICD-10-CM

## 2023-11-28 DIAGNOSIS — D573 Sickle-cell trait: Secondary | ICD-10-CM

## 2023-11-29 ENCOUNTER — Other Ambulatory Visit (HOSPITAL_COMMUNITY): Payer: Self-pay

## 2023-11-29 MED ORDER — OMRON 3 SERIES BP MONITOR DEVI
0 refills | Status: AC
  Filled 2023-11-29: qty 1, 30d supply, fill #0

## 2023-11-30 ENCOUNTER — Institutional Professional Consult (permissible substitution) (HOSPITAL_BASED_OUTPATIENT_CLINIC_OR_DEPARTMENT_OTHER): Payer: BC Managed Care – PPO | Admitting: Family

## 2023-12-06 ENCOUNTER — Other Ambulatory Visit (HOSPITAL_COMMUNITY): Payer: Self-pay

## 2023-12-06 DIAGNOSIS — F411 Generalized anxiety disorder: Secondary | ICD-10-CM | POA: Diagnosis not present

## 2023-12-06 MED ORDER — PROPRANOLOL HCL 20 MG PO TABS
20.0000 mg | ORAL_TABLET | Freq: Two times a day (BID) | ORAL | 3 refills | Status: DC | PRN
  Filled 2023-12-06: qty 60, 30d supply, fill #0

## 2023-12-06 MED ORDER — ZALEPLON 10 MG PO CAPS
10.0000 mg | ORAL_CAPSULE | Freq: Every day | ORAL | 3 refills | Status: DC
  Filled 2023-12-06: qty 30, 30d supply, fill #0

## 2023-12-06 MED ORDER — ESCITALOPRAM OXALATE 20 MG PO TABS
20.0000 mg | ORAL_TABLET | Freq: Every day | ORAL | 3 refills | Status: DC
  Filled 2023-12-06: qty 30, 30d supply, fill #0

## 2023-12-11 ENCOUNTER — Other Ambulatory Visit (HOSPITAL_COMMUNITY): Payer: Self-pay

## 2023-12-18 ENCOUNTER — Other Ambulatory Visit (HOSPITAL_COMMUNITY): Payer: Self-pay

## 2023-12-21 ENCOUNTER — Other Ambulatory Visit (HOSPITAL_COMMUNITY): Payer: Self-pay

## 2023-12-27 ENCOUNTER — Other Ambulatory Visit (HOSPITAL_COMMUNITY): Payer: Self-pay

## 2024-01-01 ENCOUNTER — Inpatient Hospital Stay: Payer: BC Managed Care – PPO | Admitting: Family

## 2024-01-01 ENCOUNTER — Other Ambulatory Visit: Payer: Self-pay | Admitting: Family

## 2024-01-01 ENCOUNTER — Inpatient Hospital Stay: Payer: BC Managed Care – PPO | Attending: Hematology & Oncology

## 2024-01-01 DIAGNOSIS — D573 Sickle-cell trait: Secondary | ICD-10-CM

## 2024-01-01 DIAGNOSIS — D649 Anemia, unspecified: Secondary | ICD-10-CM

## 2024-01-08 ENCOUNTER — Other Ambulatory Visit (HOSPITAL_COMMUNITY): Payer: Self-pay

## 2024-01-11 ENCOUNTER — Other Ambulatory Visit (HOSPITAL_COMMUNITY): Payer: Self-pay

## 2024-01-11 ENCOUNTER — Ambulatory Visit: Payer: BC Managed Care – PPO | Admitting: Dermatology

## 2024-01-11 ENCOUNTER — Encounter: Payer: Self-pay | Admitting: Dermatology

## 2024-01-11 VITALS — BP 124/84

## 2024-01-11 DIAGNOSIS — L810 Postinflammatory hyperpigmentation: Secondary | ICD-10-CM

## 2024-01-11 DIAGNOSIS — L709 Acne, unspecified: Secondary | ICD-10-CM | POA: Diagnosis not present

## 2024-01-11 DIAGNOSIS — L700 Acne vulgaris: Secondary | ICD-10-CM

## 2024-01-11 MED ORDER — SAFETY SEAL MISCELLANEOUS MISC
0 refills | Status: AC
Start: 2024-01-11 — End: ?

## 2024-01-11 MED ORDER — TRETINOIN 0.05 % EX CREA
TOPICAL_CREAM | Freq: Every day | CUTANEOUS | 0 refills | Status: AC
  Filled 2024-01-11: qty 45, 90d supply, fill #0

## 2024-01-11 NOTE — Patient Instructions (Addendum)
 Hello Hanin,  Thank you for visiting [place] again and for your dedication to improving your skin health. It was great to see the progress you've made with your acne regimen.   Here is a summary of the key instructions from today's consultation:  Tretinoin: Increase to 0.05%. Begin using it two nights a week for the first month, then increase to three nights a week.  Cleanser and Moisturizer:   Switch to La Roche-Posay blue gel cleanser for normal to oily skin and Toleriane moisturizer.   Alternative Options: If these do not suit your skin, consider switching to Kiehl's ultra-hydrating cream and creamy wash.  Dark Spots Treatment: Use the transaminic acid lightening agent mixed with vitamin C and resveratrol from [facility] as a spot treatment.  Sunscreen: Start using [employer], which is light and hydrating. A picture of this product will be included in your after-visit summary.  Follow-Up: Schedule a return visit in about 8 months. If you experience worsening conditions, please make an emergency appointment.  Please make sure to pick up your samples and detailed instructions before you leave. We look forward to seeing how you progress and are here to support you every step of the way.  Warm regards,  Dr. [facility]      Important Information  Due to recent changes in healthcare laws, you may see results of your pathology and/or laboratory studies on MyChart before the doctors have had a chance to review them. We understand that in some cases there may be results that are confusing or concerning to you. Please understand that not all results are received at the same time and often the doctors may need to interpret multiple results in order to provide you with the best plan of care or course of treatment. Therefore, we ask that you please give [place] 2 business days to thoroughly review all your results before contacting the office for clarification. Should we  see a critical lab result, you will be contacted sooner.   If You Need Anything After Your Visit  If you have any questions or concerns for your doctor, please call our main line at [phone] If no one answers, please leave a voicemail as directed and we will return your call as soon as possible. Messages left after 4 pm will be answered the following business day.   You may also send [place] a message via MyChart. We typically respond to MyChart messages within 1-2 business days.  For prescription refills, please ask your pharmacy to contact our office. Our fax number is [phone].  If you have an urgent issue when the clinic is closed that cannot wait until the next business day, you can page your doctor at the number below.    Please note that while we do our best to be available for urgent issues outside of office hours, we are not available 24/7.   If you have an urgent issue and are unable to reach [place], you may choose to seek medical care at your doctor's office, retail clinic, urgent care center, or emergency room.  If you have a medical emergency, please immediately call 911 or go to the emergency department. In the event of inclement weather, please call our main line at [phone] for an update on the status of any delays or closures.  Dermatology Medication Tips: Please keep the boxes that topical medications come in in order to help keep track of the instructions about where and how to use these. Pharmacies  typically print the medication instructions only on the boxes and not directly on the medication tubes.   If your medication is too expensive, please contact our office at [phone] or send [place] a message through MyChart.   We are unable to tell what your co-pay for medications will be in advance as this is different depending on your insurance coverage. However, we may be able to find a substitute medication at lower cost or fill out paperwork to get insurance to cover a  needed medication.   If a prior authorization is required to get your medication covered by your insurance company, please allow [place] 1-2 business days to complete this process.  Drug prices often vary depending on where the prescription is filled and some pharmacies may offer cheaper prices.  The website www.goodrx.com contains coupons for medications through different pharmacies. The prices here do not account for what the cost may be with help from insurance (it may be cheaper with your insurance), but the website can give you the price if you did not use any insurance.  - You can print the associated coupon and take it with your prescription to the pharmacy.  - You may also stop by our office during regular business hours and pick up a GoodRx coupon card.  - If you need your prescription sent electronically to a different pharmacy, notify our office through [facility] or by phone at [phone]

## 2024-01-11 NOTE — Progress Notes (Signed)
   Follow-Up Visit   Subjective  Angel Byrd is a 28 y.o. female who presents for the following: acne  Patient present today for follow up visit. Patient was last evaluated on 08/09/23. At this visit patient was prescribed Tretinoin and recommended using hyaluronic acid BID and CeraVe cleanser. Patient reports sxs are better. However, she has D/C CeraVe products as she noticed that she was having breakouts while using it. Patient denies medication changes.  The following portions of the chart were reviewed this encounter and updated as appropriate: medications, allergies, medical history  Review of Systems:  No other skin or systemic complaints except as noted in HPI or Assessment and Plan.  Objective  Well appearing patient in no apparent distress; mood and affect are within normal limits.   A focused examination was performed of the following areas: face   Relevant exam findings are noted in the Assessment and Plan.           Assessment & Plan   1. Acne - Assessment: Patient has been on an acne regimen since October, using tretinoin nightly with minimal dryness. Current skincare routine includes Vichy hyaluronic acid, CeraVe hydrating cleanser (which causes breakouts), and CeraVe moisturizer. Patient reports cyclical breakouts, possibly related to hormonal fluctuations with menstrual cycle. Skin appearance is described as "beautiful" by the clinician, but patient experiences recurrent breakouts followed by hyperpigmentation. - Plan:    Increase tretinoin to 0.05%    Use tretinoin two nights per week for the first month, then increase to three nights per week    Discontinue CeraVe hydrating cleanser    Trial La Roche-Posay blue gel cleanser for normal to oily skin    Trial La Roche-Posay Toleriane moisturizer    If new regimen ineffective, consider Kiehl's ultra-hydrating cream and creamy wash    Initiate Iztree sunscreen ([place] brand) for UV protection    Follow-up in 8  months or sooner if condition worsens  2. Post-inflammatory hyperpigmentation - Assessment: Patient has dark spots, likely post-inflammatory hyperpigmentation secondary to acne. Currently using tretinoin on these areas. No sunscreen use, which may exacerbate hyperpigmentation due to UV radiation exposure. - Plan:    Initiate transaminic acid lightening agent mixed with vitamin C and resveratrol from [facility] as a spot treatment    Educate on importance of sunscreen use to prevent darkening of hyperpigmented areas    Initiate Iztree sunscreen ([place] brand) for UV protection    No follow-ups on file.    Documentation: I have reviewed the above documentation for accuracy and completeness, and I agree with the above.  I, Angel Byrd, CMA, am acting as scribe for [employer], DO.   Langston Reusing, DO

## 2024-01-16 ENCOUNTER — Other Ambulatory Visit: Payer: Self-pay | Admitting: Internal Medicine

## 2024-01-17 ENCOUNTER — Other Ambulatory Visit (HOSPITAL_COMMUNITY): Payer: Self-pay

## 2024-01-17 ENCOUNTER — Other Ambulatory Visit: Payer: Self-pay

## 2024-01-17 MED ORDER — FUSION PLUS PO CAPS
1.0000 | ORAL_CAPSULE | Freq: Every day | ORAL | 3 refills | Status: AC
  Filled 2024-01-17: qty 30, 30d supply, fill #0
  Filled 2024-04-09 – 2024-04-22 (×2): qty 30, 30d supply, fill #1

## 2024-01-18 ENCOUNTER — Institutional Professional Consult (permissible substitution) (HOSPITAL_BASED_OUTPATIENT_CLINIC_OR_DEPARTMENT_OTHER): Payer: BC Managed Care – PPO | Admitting: Family

## 2024-01-23 ENCOUNTER — Other Ambulatory Visit: Payer: Self-pay | Admitting: Medical Oncology

## 2024-01-23 ENCOUNTER — Inpatient Hospital Stay: Admitting: Medical Oncology

## 2024-01-23 ENCOUNTER — Other Ambulatory Visit: Payer: Self-pay

## 2024-01-23 ENCOUNTER — Other Ambulatory Visit (HOSPITAL_COMMUNITY): Payer: Self-pay

## 2024-01-23 ENCOUNTER — Encounter: Payer: Self-pay | Admitting: Medical Oncology

## 2024-01-23 ENCOUNTER — Inpatient Hospital Stay: Attending: Medical Oncology

## 2024-01-23 VITALS — BP 129/80 | HR 98 | Temp 98.2°F | Resp 18 | Ht 61.0 in | Wt 185.8 lb

## 2024-01-23 DIAGNOSIS — N920 Excessive and frequent menstruation with regular cycle: Secondary | ICD-10-CM | POA: Diagnosis not present

## 2024-01-23 DIAGNOSIS — E639 Nutritional deficiency, unspecified: Secondary | ICD-10-CM | POA: Diagnosis not present

## 2024-01-23 DIAGNOSIS — R5383 Other fatigue: Secondary | ICD-10-CM | POA: Insufficient documentation

## 2024-01-23 DIAGNOSIS — Z79899 Other long term (current) drug therapy: Secondary | ICD-10-CM | POA: Insufficient documentation

## 2024-01-23 DIAGNOSIS — Z832 Family history of diseases of the blood and blood-forming organs and certain disorders involving the immune mechanism: Secondary | ICD-10-CM | POA: Insufficient documentation

## 2024-01-23 DIAGNOSIS — F5089 Other specified eating disorder: Secondary | ICD-10-CM | POA: Insufficient documentation

## 2024-01-23 DIAGNOSIS — I10 Essential (primary) hypertension: Secondary | ICD-10-CM | POA: Diagnosis not present

## 2024-01-23 DIAGNOSIS — D573 Sickle-cell trait: Secondary | ICD-10-CM | POA: Diagnosis not present

## 2024-01-23 DIAGNOSIS — D571 Sickle-cell disease without crisis: Secondary | ICD-10-CM | POA: Diagnosis not present

## 2024-01-23 DIAGNOSIS — D500 Iron deficiency anemia secondary to blood loss (chronic): Secondary | ICD-10-CM

## 2024-01-23 DIAGNOSIS — D509 Iron deficiency anemia, unspecified: Secondary | ICD-10-CM | POA: Insufficient documentation

## 2024-01-23 DIAGNOSIS — Z8249 Family history of ischemic heart disease and other diseases of the circulatory system: Secondary | ICD-10-CM | POA: Diagnosis not present

## 2024-01-23 LAB — CBC WITH DIFFERENTIAL (CANCER CENTER ONLY)
Abs Immature Granulocytes: 0.04 10*3/uL (ref 0.00–0.07)
Basophils Absolute: 0 10*3/uL (ref 0.0–0.1)
Basophils Relative: 1 %
Eosinophils Absolute: 0.1 10*3/uL (ref 0.0–0.5)
Eosinophils Relative: 1 %
HCT: 25.1 % — ABNORMAL LOW (ref 36.0–46.0)
Hemoglobin: 7.1 g/dL — ABNORMAL LOW (ref 12.0–15.0)
Immature Granulocytes: 1 %
Lymphocytes Relative: 44 %
Lymphs Abs: 2.9 10*3/uL (ref 0.7–4.0)
MCH: 18 pg — ABNORMAL LOW (ref 26.0–34.0)
MCHC: 28.3 g/dL — ABNORMAL LOW (ref 30.0–36.0)
MCV: 63.5 fL — ABNORMAL LOW (ref 80.0–100.0)
Monocytes Absolute: 0.3 10*3/uL (ref 0.1–1.0)
Monocytes Relative: 5 %
Neutro Abs: 3.3 10*3/uL (ref 1.7–7.7)
Neutrophils Relative %: 48 %
Platelet Count: 559 10*3/uL — ABNORMAL HIGH (ref 150–400)
RBC: 3.95 MIL/uL (ref 3.87–5.11)
RDW: 18.1 % — ABNORMAL HIGH (ref 11.5–15.5)
WBC Count: 6.6 10*3/uL (ref 4.0–10.5)
nRBC: 0 % (ref 0.0–0.2)

## 2024-01-23 LAB — RETIC PANEL
Immature Retic Fract: 18.5 % — ABNORMAL HIGH (ref 2.3–15.9)
RBC.: 3.91 MIL/uL (ref 3.87–5.11)
Retic Count, Absolute: 51.2 10*3/uL (ref 19.0–186.0)
Retic Ct Pct: 1.3 % (ref 0.4–3.1)
Reticulocyte Hemoglobin: 22.5 pg — ABNORMAL LOW (ref >27.9)

## 2024-01-23 LAB — SAMPLE TO BLOOD BANK

## 2024-01-23 LAB — SAVE SMEAR(SSMR), FOR PROVIDER SLIDE REVIEW

## 2024-01-23 LAB — FOLATE: Folate: 29.1 ng/mL (ref >5.9)

## 2024-01-23 LAB — PREPARE RBC (CROSSMATCH)

## 2024-01-23 LAB — VITAMIN B12: Vitamin B-12: 180 pg/mL (ref 180–914)

## 2024-01-23 LAB — FERRITIN: Ferritin: 4 ng/mL — ABNORMAL LOW (ref 11–307)

## 2024-01-23 MED ORDER — FOLIC ACID 1 MG PO TABS
1.0000 mg | ORAL_TABLET | Freq: Every day | ORAL | 3 refills | Status: AC
  Filled 2024-01-23: qty 30, 30d supply, fill #0

## 2024-01-23 NOTE — Progress Notes (Addendum)
 [facility] [phone]   [phone]  INITIAL CONSULT NOTE  Patient Care Team: Dorothyann Peng, MD as PCP - General (Internal Medicine) Ob/Gyn, [facility] (Obstetrics and Gynecology)  CHIEF COMPLAINTS/PURPOSE OF CONSULTATION:  IDA  HISTORY OF PRESENTING ILLNESS:  Angel Byrd 28 y.o. female is referred to our office by their PCP Dr. Dorothyann Peng for IDA.   Patient has been found to have IDA. She also has a history of sickle cell trait which was diagnosed after birth. She does believe that she has had anemia for most of her life.    Her last set of labs on 11/22/2023 showed a Hgb of 7.7, MCV of 65, platelet count of 472.Normal WBC. Normal creatinine/GFR. Her last set of iron studies performed on 07/19/2023 showed an iron saturation of 5%, TIBC of 359, ferritin of 6.   Blood in stools: No Change of bowel movement/constipation: No Family history of GI cancers: No Last Colonoscopy/ Endoscopy: Never History of GERD or GI bleed: No UC/Crohn's: No PPI use: No Hiatal Hernia: No NSAID use: No Blood in urine: No Difficulty swallowing: No Pica: Yes SOB: No Fatigue: Yes Prior blood transfusion: No Prior history of blood loss: No She did have her son 2 months early. He is now 33 years old.  Liver disease: No Kidney Disease: No Bariatric/Intestinal surgery: No Frequent Blood Donation: No Vaginal bleeding: Menstrual cycles are regular. She recently started taking OCP but this has not stopped her heavy menses. Very heavy with many clots. She reports that her normal cycle lasts about 4 days. Current cycle has lasted 7 days.  Prior evaluation with hematology: No Prior bone marrow biopsy: No Oral iron: Fusion plus as of this week.  Prior IV iron infusions: No Family history Anemia: No family history of thalassemia  No Personal or family history of bleeding or clotting disorders.  Special diets: No No use of GLP-1: JYNWGN- she has been on this  for about 1 year Iron rich foods in diet: No Eating habits: Minimal   MEDICAL HISTORY:  Past Medical History:  Diagnosis Date   HTN (hypertension)    Iron deficiency anemia     SURGICAL HISTORY: Past Surgical History:  Procedure Laterality Date   CESAREAN SECTION N/A 11/22/2019   Procedure: CESAREAN SECTION;  Surgeon: Silverio Lay, MD;  Location: [facility] LD ORS;  Service: Obstetrics;  Laterality: N/A;   NO PAST SURGERIES      SOCIAL HISTORY: Social History   Socioeconomic History   Marital status: Single    Spouse name: Not on file   Number of children: Not on file   Years of education: Not on file   Highest education level: Not on file  Occupational History   Not on file  Tobacco Use   Smoking status: Never   Smokeless tobacco: Never  Vaping Use   Vaping status: Never Used  Substance and Sexual Activity   Alcohol use: No   Drug use: No   Sexual activity: Yes    Birth control/protection: None  Other Topics Concern   Not on file  Social History Narrative   Not on file   Social Drivers of Health   Financial Resource Strain: Not on file  Food Insecurity: No Food Insecurity (01/23/2024)   Hunger Vital Sign    Worried About Running Out of Food in the Last Year: Never true    Ran Out of Food in the Last Year: Never true  Transportation Needs: No Transportation Needs (01/23/2024)  PRAPARE - Administrator, Civil Service (Medical): No    Lack of Transportation (Non-Medical): No  Physical Activity: Not on file  Stress: Not on file  Social Connections: Not on file  Intimate Partner Violence: Not At Risk (01/23/2024)   Humiliation, Afraid, Rape, and Kick questionnaire    Fear of Current or Ex-Partner: No    Emotionally Abused: No    Physically Abused: No    Sexually Abused: No    FAMILY HISTORY: Family History  Problem Relation Age of Onset   Sickle cell anemia Mother    Heart disease Father    Hypertension Sister     ALLERGIES:  has no known  allergies.  MEDICATIONS:  Current Outpatient Medications  Medication Sig Dispense Refill   amLODipine (NORVASC) 5 MG tablet Take 1 tablet (5 mg total) by mouth at bedtime. 90 tablet 2   Blood Pressure Monitoring (OMRON 3 SERIES BP MONITOR) DEVI Use to monitor blood pressure as directed 1 each 0   escitalopram (LEXAPRO) 10 MG tablet Take 1 tablet (10 mg total) by mouth daily. 90 tablet 1   folic acid (FOLVITE) 1 MG tablet Take 1 tablet (1 mg total) by mouth daily. 90 tablet 3   Iron-FA-B Cmp-C-Biot-Probiotic (FUSION PLUS) CAPS Take 1 capsule by mouth daily. 30 capsule 3   norethindrone-ethinyl estradiol-FE (JUNEL FE 1/20) 1-20 MG-MCG tablet Take 1 tablet by mouth daily. 84 tablet 4   propranolol (INDERAL) 20 MG tablet Take 1 tablet (20 mg total) by mouth 2 (two) times daily as needed. 60 tablet 3   Safety Seal Miscellaneous MISC Melaxemic cream with tranexamic acid 5% kojic acid usp 2% vit c usp 2.5% hyaluronic acid excp 0.1% use twice a day on problem areas. 15 g 0   Semaglutide-Weight Management (WEGOVY) 2.4 MG/0.75ML SOAJ Inject 2.4 mg into the skin once a week. 3 mL 3   tretinoin (RETIN-A) 0.05 % cream Apply pea size amount 2 times a week at bedtime. After 1 month increase to using 3 times weekly at night. 45 g 0   valsartan (DIOVAN) 80 MG tablet Take 1 tablet (80 mg total) by mouth daily. 90 tablet 2   zaleplon (SONATA) 10 MG capsule Take 1 capsule (10 mg total) by mouth daily. 30 capsule 3   zolpidem (AMBIEN) 10 MG tablet Take 1 tablet (10 mg total) by mouth daily. 30 tablet 3   zaleplon (SONATA) 10 MG capsule Take 1 capsule (10 mg total) by mouth daily. (Patient not taking: Reported on 01/23/2024) 30 capsule 3   No current facility-administered medications for this visit.    REVIEW OF SYSTEMS:   Constitutional: ( - ) fevers, ( - )  chills , ( - ) night sweats Eyes: ( - ) blurriness of vision, ( - ) double vision, ( - ) watery eyes Ears, nose, mouth, throat, and face: ( - ) mucositis, (  - ) sore throat Respiratory: ( - ) cough, ( - ) dyspnea, ( - ) wheezes Cardiovascular: ( - ) palpitation, ( - ) chest discomfort, ( - ) lower extremity swelling Gastrointestinal:  ( - ) nausea, ( - ) heartburn, ( - ) change in bowel habits Skin: ( - ) abnormal skin rashes Lymphatics: ( - ) new lymphadenopathy, ( - ) easy bruising Neurological: ( - ) numbness, ( - ) tingling, ( - ) new weaknesses Behavioral/Psych: ( - ) mood change, ( - ) new changes  All other systems were reviewed with the  patient and are negative.  PHYSICAL EXAMINATION: ECOG PERFORMANCE STATUS: 1 - Symptomatic but completely ambulatory  Vitals:   01/23/24 1313  BP: 129/80  Pulse: 98  Resp: 18  Temp: 98.2 F (36.8 C)  SpO2: 100%   Filed Weights   01/23/24 1313  Weight: 185 lb 12.8 oz (84.3 kg)    GENERAL: well appearing female in NAD  SKIN: skin color, texture, turgor are normal, no rashes or significant lesions EYES: conjunctiva are pink and non-injected, sclera clear OROPHARYNX: no exudate, no erythema; lips, buccal mucosa, and tongue normal  NECK: supple, non-tender LYMPH:  no palpable lymphadenopathy in the cervical, axillary or supraclavicular lymph nodes.  LUNGS: clear to auscultation and percussion with normal breathing effort HEART: regular rate & rhythm and no murmurs and no lower extremity edema ABDOMEN: soft, non-tender, non-distended, normal bowel sounds Musculoskeletal: no cyanosis of digits and no clubbing  PSYCH: alert & oriented x 3, fluent speech NEURO: no focal motor/sensory deficits  LABORATORY DATA:  Pending    ASSESSMENT & PLAN Angel Byrd is a 28 y.o. female who was referred to [place] for IDA in the setting of sickle cell anemia and menorrhagia.   Her IDA is new to [place] but chronic to her. Her Hgb counts are low.  I am concerned about the length and heaviness of her current menstrual cycles. She will reach out to her GYN today after our visit for further guidance.  In terms of  her IDA Her menstrual cycles have been significantly heavy and long in nature. This, along with her sickle cell trait, appears the most likely cause of her iron deficiency given the significant of blood loss. No GI symptoms. I have recommended GI evaluation if iron deficiency continues or GI symptoms develop. For now we discussed IV iron as the oral iron does not appear effective. Discussed how this medication is administered along with common potential side effects (headache, nausea, IV complications) along with very low allergic reaction risk. She is agreeable to start IV therapy. Orders placed and scheduling aware. We will plan for her to follow up in 2 months with labs. She is aware that length of iron improvement often relates to iron loss and is very patient dependent. She may need repeat IVF iron infusions as soon as 2 month or may never need one again. If iron levels do not respond as expected we will switch to a different version of IV iron. She will hold her oral iron while undergoing IV iron treatments and will restart oral iron following her last infusion.  Labs today RTC 2 months APP, labs (CBC, iron, ferritin, retic, sample)  UPDATE: Hgb 7.1. I have called patient and we discussed blood products. She has consented to blood products and IV iron. She will come in tomorrow for 1 unit of blood and session 1/3 of weekly venofer   All questions were answered. The patient knows to call the clinic with any problems, questions or concerns.  I have spent a total of 40 minutes minutes of face-to-face and non-face-to-face time, preparing to see the patient, obtaining and/or reviewing separately obtained history, performing a medically appropriate examination, counseling and educating the patient, ordering medications/tests/procedures, referring and communicating with other health care professionals, documenting clinical information in the electronic health record, independently interpreting results and  communicating results to the patient, and care coordination.    Clent Jacks PA-C Department of Hematology/Oncology [facility]

## 2024-01-24 ENCOUNTER — Other Ambulatory Visit: Payer: Self-pay | Admitting: Medical Oncology

## 2024-01-24 ENCOUNTER — Encounter: Payer: Self-pay | Admitting: Medical Oncology

## 2024-01-24 ENCOUNTER — Inpatient Hospital Stay

## 2024-01-24 VITALS — BP 128/88 | HR 92 | Temp 98.0°F | Resp 18

## 2024-01-24 DIAGNOSIS — Z832 Family history of diseases of the blood and blood-forming organs and certain disorders involving the immune mechanism: Secondary | ICD-10-CM | POA: Diagnosis not present

## 2024-01-24 DIAGNOSIS — I10 Essential (primary) hypertension: Secondary | ICD-10-CM | POA: Diagnosis not present

## 2024-01-24 DIAGNOSIS — N920 Excessive and frequent menstruation with regular cycle: Secondary | ICD-10-CM | POA: Diagnosis not present

## 2024-01-24 DIAGNOSIS — Z8249 Family history of ischemic heart disease and other diseases of the circulatory system: Secondary | ICD-10-CM | POA: Diagnosis not present

## 2024-01-24 DIAGNOSIS — D500 Iron deficiency anemia secondary to blood loss (chronic): Secondary | ICD-10-CM

## 2024-01-24 DIAGNOSIS — R5383 Other fatigue: Secondary | ICD-10-CM | POA: Diagnosis not present

## 2024-01-24 DIAGNOSIS — F5089 Other specified eating disorder: Secondary | ICD-10-CM | POA: Diagnosis not present

## 2024-01-24 DIAGNOSIS — Z79899 Other long term (current) drug therapy: Secondary | ICD-10-CM | POA: Diagnosis not present

## 2024-01-24 DIAGNOSIS — D571 Sickle-cell disease without crisis: Secondary | ICD-10-CM | POA: Diagnosis not present

## 2024-01-24 LAB — IRON AND IRON BINDING CAPACITY (CC-WL,HP ONLY)
Iron: 40 ug/dL (ref 28–170)
Saturation Ratios: 10 % — ABNORMAL LOW (ref 10.4–31.8)
TIBC: 405 ug/dL (ref 250–450)
UIBC: 365 ug/dL (ref 148–442)

## 2024-01-24 LAB — ERYTHROPOIETIN: Erythropoietin: 168.1 m[IU]/mL — ABNORMAL HIGH (ref 2.6–18.5)

## 2024-01-24 MED ORDER — SODIUM CHLORIDE 0.9 % IV SOLN: INTRAVENOUS | Status: DC

## 2024-01-24 MED ORDER — ACETAMINOPHEN 325 MG PO TABS: 650.0000 mg | ORAL_TABLET | Freq: Once | ORAL | Status: DC

## 2024-01-24 MED ORDER — CYANOCOBALAMIN 1000 MCG/ML IJ SOLN
1000.0000 ug | Freq: Once | INTRAMUSCULAR | Status: AC
  Administered 2024-01-24: 1000 ug via INTRAMUSCULAR
  Filled 2024-01-24: qty 1

## 2024-01-24 MED ORDER — SODIUM CHLORIDE 0.9% IV SOLUTION
250.0000 mL | INTRAVENOUS | Status: DC
  Administered 2024-01-24: 250 mL via INTRAVENOUS

## 2024-01-24 MED ORDER — DIPHENHYDRAMINE HCL 25 MG PO CAPS
25.0000 mg | ORAL_CAPSULE | Freq: Once | ORAL | Status: AC
Start: 2024-01-24 — End: 2024-01-24
  Administered 2024-01-24: 25 mg via ORAL
  Filled 2024-01-24: qty 1

## 2024-01-24 MED ORDER — SODIUM CHLORIDE 0.9 % IV SOLN
300.0000 mg | Freq: Once | INTRAVENOUS | Status: AC
  Administered 2024-01-24: 300 mg via INTRAVENOUS
  Filled 2024-01-24: qty 300

## 2024-01-24 NOTE — Patient Instructions (Addendum)
 Blood Transfusion, Adult, Care After The following information offers guidance on how to care for yourself after your procedure. Your health care provider may also give you more specific instructions. If you have problems or questions, contact your health care provider. What can I expect after the procedure? After the procedure, it is common to have: Bruising and soreness where the IV was inserted. A headache. Follow these instructions at home: IV insertion site care     Follow instructions from your health care provider about how to take care of your IV insertion site. Make sure you: Wash your hands with soap and water for at least 20 seconds before and after you change your bandage (dressing). If soap and water are not available, use hand sanitizer. Change your dressing as told by your health care provider. Check your IV insertion site every day for signs of infection. Check for: Redness, swelling, or pain. Bleeding from the site. Warmth. Pus or a bad smell. General instructions Take over-the-counter and prescription medicines only as told by your health care provider. Rest as told by your health care provider. Return to your normal activities as told by your health care provider. Keep all follow-up visits. Lab tests may need to be done at certain periods to recheck your blood counts. Contact a health care provider if: You have itching or red, swollen areas of skin (hives). You have a fever or chills. You have pain in the head, back, or chest. You feel anxious or you feel weak after doing your normal activities. You have redness, swelling, warmth, or pain around the IV insertion site. You have blood coming from the IV insertion site that does not stop with pressure. You have pus or a bad smell coming from your IV insertion site. If you received your blood transfusion in an outpatient setting, you will be told whom to contact to report any reactions. Get help right away if: You  have symptoms of a serious allergic or immune system reaction, including: Trouble breathing or shortness of breath. Swelling of the face, feeling flushed, or widespread rash. Dark urine or blood in the urine. Fast heartbeat. These symptoms may be an emergency. Get help right away. Call 911. Do not wait to see if the symptoms will go away. Do not drive yourself to the hospital. Summary Bruising and soreness around the IV insertion site are common. Check your IV insertion site every day for signs of infection. Rest as told by your health care provider. Return to your normal activities as told by your health care provider. Get help right away for symptoms of a serious allergic or immune system reaction to the blood transfusion. This information is not intended to replace advice given to you by your health care provider. Make sure you discuss any questions you have with your health care provider. Document Revised: 01/21/2022 Document Reviewed: 01/21/2022 Elsevier Patient Education  2024 Else[employer]Iron Sucrose Injection What is this medication? IRON SUCROSE (EYE ern SOO krose) treats low levels of iron (iron deficiency anemia) in people with kidney disease. Iron is a mineral that plays an important role in making red blood cells, which carry oxygen from your lungs to the rest of your body. This medicine may be used for other purposes; ask your health care provider or pharmacist if you have questions. COMMON BRAND NAME(S): Venofer What should I tell my care team before I take this medication? They need to know if you have any of these conditions: Anemia not caused by low  iron levels Heart disease High levels of iron in the blood Kidney disease Liver disease An unusual or allergic reaction to iron, other medications, foods, dyes, or preservatives Pregnant or trying to get pregnant Breastfeeding How should I use this medication? This medication is infused into a vein. It is given by your  care team in a hospital or clinic setting. Talk to your care team about the use of this medication in children. While it may be prescribed for children as young as 2 years for selected conditions, precautions do apply. Overdosage: If you think you have taken too much of this medicine contact a poison control center or emergency room at once. NOTE: This medicine is only for you. Do not share this medicine with others. What if I miss a dose? Keep appointments for follow-up doses. It is important not to miss your dose. Call your care team if you are unable to keep an appointment. What may interact with this medication? Do not take this medication with any of the following: Deferoxamine Dimercaprol Other iron products This medication may also interact with the following: Chloramphenicol Deferasirox This list may not describe all possible interactions. Give your health care provider a list of all the medicines, herbs, non-prescription drugs, or dietary supplements you use. Also tell them if you smoke, drink alcohol, or use illegal drugs. Some items may interact with your medicine. What should I watch for while using this medication? Visit your care team for regular checks on your progress. Tell your care team if your symptoms do not start to get better or if they get worse. You may need blood work done while you are taking this medication. You may need to eat more foods that contain iron. Talk to your care team. Foods that contain iron include whole grains or cereals, dried fruits, beans, peas, leafy green vegetables, and organ meats (liver, kidney). What side effects may I notice from receiving this medication? Side effects that you should report to your care team as soon as possible: Allergic reactions--skin rash, itching, hives, swelling of the face, lips, tongue, or throat Low blood pressure--dizziness, feeling faint or lightheaded, blurry vision Shortness of breath Side effects that usually  do not require medical attention (report to your care team if they continue or are bothersome): Flushing Headache Joint pain Muscle pain Nausea Pain, redness, or irritation at injection site This list may not describe all possible side effects. Call your doctor for medical advice about side effects. You may report side effects to FDA at 1-800-FDA-1088. Where should I keep my medication? This medication is given in a hospital or clinic. It will not be stored at home. NOTE: This sheet is a summary. It may not cover all possible information. If you have questions about this medicine, talk to your doctor, pharmacist, or health care provider.  2024 Elsevier/Gold Standard (2023-06-14 00:00:00)

## 2024-01-25 LAB — TYPE AND SCREEN
ABO/RH(D): A POS
Antibody Screen: NEGATIVE
Unit division: 0

## 2024-01-25 LAB — BPAM RBC
Blood Product Expiration Date: 202504182359
ISSUE DATE / TIME: 202503190717
Unit Type and Rh: 202504182359
Unit Type and Rh: 6200

## 2024-01-26 ENCOUNTER — Other Ambulatory Visit (HOSPITAL_COMMUNITY): Payer: Self-pay

## 2024-02-14 ENCOUNTER — Other Ambulatory Visit (HOSPITAL_COMMUNITY): Payer: Self-pay

## 2024-02-14 ENCOUNTER — Encounter (HOSPITAL_COMMUNITY): Payer: Self-pay

## 2024-02-14 DIAGNOSIS — F331 Major depressive disorder, recurrent, moderate: Secondary | ICD-10-CM | POA: Diagnosis not present

## 2024-02-14 MED ORDER — PROPRANOLOL HCL 20 MG PO TABS
20.0000 mg | ORAL_TABLET | Freq: Two times a day (BID) | ORAL | 3 refills | Status: AC
Start: 2024-02-14 — End: ?
  Filled 2024-02-14: qty 60, 30d supply, fill #0

## 2024-02-14 MED ORDER — ZALEPLON 10 MG PO CAPS
10.0000 mg | ORAL_CAPSULE | Freq: Every day | ORAL | 3 refills | Status: DC
  Filled 2024-02-14: qty 30, 30d supply, fill #0

## 2024-02-14 MED ORDER — ESCITALOPRAM OXALATE 20 MG PO TABS
20.0000 mg | ORAL_TABLET | Freq: Every day | ORAL | 3 refills | Status: DC
  Filled 2024-02-14: qty 90, 90d supply, fill #0

## 2024-02-15 ENCOUNTER — Encounter: Payer: Self-pay | Admitting: Internal Medicine

## 2024-02-15 ENCOUNTER — Telehealth: Admitting: Internal Medicine

## 2024-02-15 VITALS — Wt 184.0 lb

## 2024-02-15 DIAGNOSIS — H5789 Other specified disorders of eye and adnexa: Secondary | ICD-10-CM | POA: Diagnosis not present

## 2024-02-15 DIAGNOSIS — T7840XA Allergy, unspecified, initial encounter: Secondary | ICD-10-CM | POA: Diagnosis not present

## 2024-02-15 DIAGNOSIS — F419 Anxiety disorder, unspecified: Secondary | ICD-10-CM | POA: Diagnosis not present

## 2024-02-15 DIAGNOSIS — I10 Essential (primary) hypertension: Secondary | ICD-10-CM

## 2024-02-15 DIAGNOSIS — E66811 Obesity, class 1: Secondary | ICD-10-CM

## 2024-02-15 DIAGNOSIS — Z6834 Body mass index (BMI) 34.0-34.9, adult: Secondary | ICD-10-CM

## 2024-02-15 DIAGNOSIS — R002 Palpitations: Secondary | ICD-10-CM

## 2024-02-15 NOTE — Progress Notes (Signed)
 Virtual Visit via Video Note  I,Victoria T Hamilton, CMA,acting as a Neurosurgeon for Smiley Dung, MD.,have documented all relevant documentation on the behalf of Smiley Dung, MD,as directed by  Smiley Dung, MD while in the presence of Smiley Dung, MD.  I connected with Beverly Buckler on 02/15/24 at 10:20 AM EDT by a video enabled telemedicine application and verified that I am speaking with the correct person using two identifiers.  Patient Location: Home Provider Location: Office/Clinic  I discussed the limitations, risks, security, and privacy concerns of performing an evaluation and management service by video and the availability of in person appointments. I also discussed with the patient that there may be a patient responsible charge related to this service. The patient expressed understanding and agreed to proceed.  Subjective: PCP: Cleave Curling, MD  Chief Complaint  Patient presents with   Referral    Patient presents today for referral to an allergist. She has had two episodes of eye swelling.    Discussed the use of AI scribe software for clinical note transcription with the patient, who gave verbal consent to proceed.  History of Present Illness Angel Byrd is a 28 year old female who presents with recurrent episodes of eye swelling and itching.   She experiences recurrent episodes of eye swelling and itching. The first episode occurred almost a month ago, involving both eyes with swelling but no itching. The most recent episode occurred yesterday, affecting only the left eye, with both swelling and itching present. The swelling improved significantly after taking Benadryl, which she also used during the first episode. No issues with pollen allergies are reported, and Benadryl effectively alleviates her symptoms.  She recalls that the first episode happened after her birthday weekend ([birth year]), during which she did not consume any seafood. However, the recent  episode occurred after eating hibachi shrimp. She is uncertain if the episodes are related to food intake, as the first episode occurred without any recent food consumption.  She is on [facility] for weight management and has been advised to monitor her weight regularly.    ROS: Per HPI  Current Outpatient Medications:    amLODipine (NORVASC) 5 MG tablet, Take 1 tablet (5 mg total) by mouth at bedtime., Disp: 90 tablet, Rfl: 2   Blood Pressure Monitoring (OMRON 3 SERIES BP MONITOR) DEVI, Use to monitor blood pressure as directed, Disp: 1 each, Rfl: 0   escitalopram (LEXAPRO) 10 MG tablet, Take 1 tablet (10 mg total) by mouth daily., Disp: 90 tablet, Rfl: 1   folic acid (FOLVITE) 1 MG tablet, Take 1 tablet (1 mg total) by mouth daily., Disp: 90 tablet, Rfl: 3   Iron-FA-B Cmp-C-Biot-Probiotic (FUSION PLUS) CAPS, Take 1 capsule by mouth daily., Disp: 30 capsule, Rfl: 3   norethindrone-ethinyl estradiol-FE (JUNEL FE 1/20) 1-20 MG-MCG tablet, Take 1 tablet by mouth daily., Disp: 84 tablet, Rfl: 4   propranolol (INDERAL) 20 MG tablet, Take 1 tablet (20 mg total) by mouth 2 (two) times daily. Take as needed., Disp: 60 tablet, Rfl: 3   Safety Seal Miscellaneous MISC, Melaxemic cream with tranexamic acid 5% kojic acid usp 2% vit c usp 2.5% hyaluronic acid excp 0.1% use twice a day on problem areas., Disp: 15 g, Rfl: 0   Semaglutide-Weight Management (WEGOVY) 2.4 MG/0.75ML SOAJ, Inject 2.4 mg into the skin once a week., Disp: 3 mL, Rfl: 3   tretinoin (RETIN-A) 0.05 % cream, Apply pea size amount 2 times a week at bedtime. After  1 month increase to using 3 times weekly at night., Disp: 45 g, Rfl: 0   zaleplon (SONATA) 10 MG capsule, Take 1 capsule (10 mg total) by mouth daily. (Patient not taking: Reported on 01/23/2024), Disp: 30 capsule, Rfl: 3   zaleplon (SONATA) 10 MG capsule, Take 1 capsule (10 mg total) by mouth daily., Disp: 30 capsule, Rfl: 3   zaleplon (SONATA) 10 MG capsule, Take 1 capsule (10 mg  total) by mouth daily., Disp: 30 capsule, Rfl: 3   zolpidem (AMBIEN) 10 MG tablet, Take 1 tablet (10 mg total) by mouth daily., Disp: 30 tablet, Rfl: 3  Observations/Objective: Today's Vitals   02/15/24 1705  Weight: 184 lb (83.5 kg)   Wt Readings from Last 3 Encounters:  02/15/24 184 lb (83.5 kg)  01/23/24 185 lb 12.8 oz (84.3 kg)  11/22/23 180 lb 6.4 oz (81.8 kg)     Physical Exam Vitals and nursing note reviewed.  Constitutional:      Appearance: Normal appearance.  HENT:     Head: Normocephalic and atraumatic.  Eyes:     Extraocular Movements: Extraocular movements intact.  Pulmonary:     Effort: Pulmonary effort is normal.  Musculoskeletal:     Cervical back: Normal range of motion.  Skin:    General: Skin is warm.  Neurological:     General: No focal deficit present.     Mental Status: She is alert.  Psychiatric:        Mood and Affect: Mood normal.        Behavior: Behavior normal.     Assessment and Plan: Eye swelling, left Assessment & Plan: Improvement with Benadryl suggests allergic reaction. Possible food or environmental allergens. - Refer to allergist for comprehensive allergy testing. - Advise avoidance of shrimp until testing completed. - Instruct to carry Benadryl for recurrent swelling.  Orders: -     Ambulatory referral to Allergy  Allergic reaction, initial encounter Assessment & Plan: Will refer her to allergist for further evaluation. Advised to keep Benadryl on her for possible future episodes. Pt advised she will likely be checked for both food/environmental triggers.   Orders: -     Ambulatory referral to Allergy  Anxiety Assessment & Plan: Chronic, currently stable. Also followed by Dr. Dolan Freiberg.  On Lexapro 10 mg, prefers 10 mg dose. - Refill Lexapro 10 mg prescription. - Advise use of pill cutter if only 20 mg tablets available.   Essential hypertension, benign Assessment & Plan: Chronic, she has been controlled.  She will  continue with amlodipine daily. Encouraged to follow low sodium diet.    Palpitations Assessment & Plan: Propranolol prescribed but not initiated due to polypharmacy concerns. - Discuss importance of managing palpitations. - Consider starting propranolol if symptoms persist. -Sx have decreased since starting meds for anxiety   Class 1 obesity due to excess calories with serious comorbidity and body mass index (BMI) of 34.0 to 34.9 in adult Assessment & Plan: Chronic, she has gained 4 lbs since her last visit. Encouraged to incorporate more exercise into her daily routine. She is encouraged to add strength training to her weekly routine. Importance of adequate protein intake was also stressed to the patient. She will f/u in 12 weeks.      Follow Up Instructions: Return if symptoms worsen or fail to improve.   I discussed the assessment and treatment plan with the patient. The patient was provided an opportunity to ask questions, and all were answered. The patient agreed with the plan  and demonstrated an understanding of the instructions.   The patient was advised to call back or seek an in-person evaluation if the symptoms worsen or if the condition fails to improve as anticipated.  The above assessment and management plan was discussed with the patient. The patient verbalized understanding of and has agreed to the management plan.   I, Smiley Dung, MD, have reviewed all documentation for this visit. The documentation on 02/15/24 for the exam, diagnosis, procedures, and orders are all accurate and complete.

## 2024-02-22 DIAGNOSIS — H5789 Other specified disorders of eye and adnexa: Secondary | ICD-10-CM | POA: Insufficient documentation

## 2024-02-22 DIAGNOSIS — R002 Palpitations: Secondary | ICD-10-CM | POA: Insufficient documentation

## 2024-02-22 DIAGNOSIS — T7840XA Allergy, unspecified, initial encounter: Secondary | ICD-10-CM | POA: Insufficient documentation

## 2024-02-22 NOTE — Assessment & Plan Note (Signed)
 Chronic, she has been controlled.  She will continue with amlodipine daily. Encouraged to follow low sodium diet.

## 2024-02-22 NOTE — Assessment & Plan Note (Signed)
 Chronic, currently stable. Also followed by Dr. Dolan Freiberg.  On Lexapro 10 mg, prefers 10 mg dose. - Refill Lexapro 10 mg prescription. - Advise use of pill cutter if only 20 mg tablets available.

## 2024-02-22 NOTE — Assessment & Plan Note (Signed)
 Chronic, she has gained 4 lbs since her last visit. Encouraged to incorporate more exercise into her daily routine. She is encouraged to add strength training to her weekly routine. Importance of adequate protein intake was also stressed to the patient. She will f/u in 12 weeks.

## 2024-02-22 NOTE — Assessment & Plan Note (Signed)
 Propranolol prescribed but not initiated due to polypharmacy concerns. - Discuss importance of managing palpitations. - Consider starting propranolol if symptoms persist. -Sx have decreased since starting meds for anxiety

## 2024-02-22 NOTE — Assessment & Plan Note (Signed)
 Will refer her to allergist for further evaluation. Advised to keep Benadryl on her for possible future episodes. Pt advised she will likely be checked for both food/environmental triggers.

## 2024-02-22 NOTE — Patient Instructions (Signed)
 Food Allergy  A food allergy is when your body reacts to a food in a way that is not normal. The reaction can be mild or very bad. A very bad allergic reaction is called an anaphylactic reaction (anaphylaxis). A very bad reaction is an emergency. What are the causes? Common foods that can cause a reaction are: Milk. Eggs. Peanuts. Seafood. Wheat. Soy. Tree nuts. These include pecans, walnuts, and cashews. What are the signs or symptoms? Signs of a mild reaction Stuffy nose. Tingling in the mouth. An itchy, red rash. Vomiting. Watery poop (diarrhea). Signs of a very bad reaction Itchy, red, swollen areas of skin (hives). Swelling of your: Face or eyes. Lips. Mouth or tongue. Throat. Trouble with: Breathing. Talking. Swallowing. Noisy breathing, high-pitched whistling sounds when you breathe, most often when you breathe out (wheezing). Pain in your belly. Having any of these feelings: Warmth in your face (flushed). Dizziness, light-headedness, or fainting. Get help right away if you have symptoms of anaphylaxis. Follow these instructions at home: If you are being tested for an allergy: Avoid foods as told by your doctor (elimination diet). Write down what you eat and drink in a notebook (food diary). Each day, write: What you eat and drink and when. What problems you have and when. If you have a very bad allergy:  Wear a bracelet or necklace that says you have an allergy. Carry your allergy kit (anaphylaxis kit) or an allergy shot (epinephrine injection) with you all the time. Use them as told by your doctor. Make sure that you, your family, and your boss know: The signs of a very bad reaction. How to use your allergy kit. How to give an allergy shot. If you use an allergy shot: Replace your allergy shot immediately after you use it. This is important because you may have another allergic reaction. If possible, carry two allergy shots. General instructions Avoid  the foods that you are allergic to. Read food labels. Look for ingredients that you are allergic to. When you are at a restaurant, tell your server that you have an allergy. Ask if your meal has an ingredient that you are allergic to. Take over-the-counter and prescription medicines only as told by your doctor. Do not drive or use machines until your doctor says that it is safe. Tell all people who care for you that you have a food allergy. This includes your doctor and dentist. If you think that you might be allergic to something else, talk with your doctor. Do not eat a food to see if you are allergic to it without talking with your doctor first. Contact a doctor if: You have signs of a reaction that have not gone away after 2 days. You get worse. You have new signs of a reaction. Get help right away if you have signs of a very bad reaction: Itchy, red, swollen areas of skin. Swelling of your: Face or eyes. Lips. Mouth or tongue. Throat. Trouble with: Breathing. Talking. Swallowing. Noisy breathing (wheezing). Having any of these feelings: Warmth in your face (flushed). Dizziness, light-headedness, or fainting. Pain in your belly. These signs may be an emergency. Use your allergy shot or allergy kit as you have been told. Get medical help right away. Call your local emergency services (911 in the [place] Do not wait to see if the signs will go away. Do not drive yourself to the hospital. If you had to use your allergy pen, you must go to the emergency room even if  the medicine seems to be working. This is important because another allergic reaction may happen within 3 days. Summary A food allergy is when your body reacts to a food in a way that is not normal. Avoid the foods that you are allergic to. Wear a bracelet or necklace that says you have an allergy. Carry your allergy kit (anaphylaxis kit) or an allergy shot (epinephrine injection) with you all the time. Use them as told  by your doctor. This information is not intended to replace advice given to you by your health care provider. Make sure you discuss any questions you have with your health care provider. Document Revised: 02/09/2021 Document Reviewed: 02/09/2021 Elsevier Patient Education  2024 [employer].

## 2024-02-22 NOTE — Assessment & Plan Note (Signed)
 Improvement with Benadryl suggests allergic reaction. Possible food or environmental allergens. - Refer to allergist for comprehensive allergy testing. - Advise avoidance of shrimp until testing completed. - Instruct to carry Benadryl for recurrent swelling.

## 2024-02-28 ENCOUNTER — Other Ambulatory Visit (HOSPITAL_COMMUNITY): Payer: Self-pay

## 2024-02-29 ENCOUNTER — Other Ambulatory Visit (HOSPITAL_COMMUNITY): Payer: Self-pay

## 2024-02-29 ENCOUNTER — Encounter: Payer: Self-pay | Admitting: Hematology & Oncology

## 2024-03-13 ENCOUNTER — Ambulatory Visit: Admitting: Allergy

## 2024-03-16 ENCOUNTER — Other Ambulatory Visit: Payer: Self-pay | Admitting: Internal Medicine

## 2024-03-18 ENCOUNTER — Other Ambulatory Visit (HOSPITAL_COMMUNITY): Payer: Self-pay

## 2024-03-18 MED ORDER — WEGOVY 2.4 MG/0.75ML ~~LOC~~ SOAJ
2.4000 mg | SUBCUTANEOUS | 3 refills | Status: DC
  Filled 2024-03-18 – 2024-03-29 (×3): qty 3, 28d supply, fill #0
  Filled 2024-04-22: qty 3, 28d supply, fill #1
  Filled 2024-05-19: qty 3, 28d supply, fill #2
  Filled 2024-06-18: qty 3, 28d supply, fill #3

## 2024-03-25 ENCOUNTER — Inpatient Hospital Stay

## 2024-03-25 ENCOUNTER — Inpatient Hospital Stay: Admitting: Medical Oncology

## 2024-03-27 ENCOUNTER — Other Ambulatory Visit (HOSPITAL_COMMUNITY): Payer: Self-pay

## 2024-03-29 ENCOUNTER — Other Ambulatory Visit (HOSPITAL_COMMUNITY): Payer: Self-pay

## 2024-03-29 ENCOUNTER — Encounter: Payer: Self-pay | Admitting: Hematology & Oncology

## 2024-03-31 ENCOUNTER — Other Ambulatory Visit: Payer: Self-pay

## 2024-04-08 ENCOUNTER — Encounter: Payer: Self-pay | Admitting: Medical Oncology

## 2024-04-08 ENCOUNTER — Other Ambulatory Visit: Payer: Self-pay

## 2024-04-08 ENCOUNTER — Ambulatory Visit: Payer: Self-pay | Admitting: Medical Oncology

## 2024-04-08 ENCOUNTER — Inpatient Hospital Stay

## 2024-04-08 ENCOUNTER — Inpatient Hospital Stay (HOSPITAL_BASED_OUTPATIENT_CLINIC_OR_DEPARTMENT_OTHER): Admitting: Medical Oncology

## 2024-04-08 ENCOUNTER — Inpatient Hospital Stay: Attending: Medical Oncology

## 2024-04-08 VITALS — BP 124/89 | HR 102 | Temp 98.2°F | Resp 19 | Wt 186.0 lb

## 2024-04-08 DIAGNOSIS — E639 Nutritional deficiency, unspecified: Secondary | ICD-10-CM

## 2024-04-08 DIAGNOSIS — E538 Deficiency of other specified B group vitamins: Secondary | ICD-10-CM | POA: Diagnosis not present

## 2024-04-08 DIAGNOSIS — D500 Iron deficiency anemia secondary to blood loss (chronic): Secondary | ICD-10-CM

## 2024-04-08 DIAGNOSIS — D649 Anemia, unspecified: Secondary | ICD-10-CM

## 2024-04-08 DIAGNOSIS — E46 Unspecified protein-calorie malnutrition: Secondary | ICD-10-CM | POA: Diagnosis not present

## 2024-04-08 DIAGNOSIS — N920 Excessive and frequent menstruation with regular cycle: Secondary | ICD-10-CM | POA: Diagnosis not present

## 2024-04-08 DIAGNOSIS — D573 Sickle-cell trait: Secondary | ICD-10-CM

## 2024-04-08 DIAGNOSIS — Z79899 Other long term (current) drug therapy: Secondary | ICD-10-CM | POA: Insufficient documentation

## 2024-04-08 DIAGNOSIS — D75838 Other thrombocytosis: Secondary | ICD-10-CM | POA: Diagnosis not present

## 2024-04-08 DIAGNOSIS — I10 Essential (primary) hypertension: Secondary | ICD-10-CM | POA: Diagnosis not present

## 2024-04-08 DIAGNOSIS — D509 Iron deficiency anemia, unspecified: Secondary | ICD-10-CM

## 2024-04-08 LAB — SAMPLE TO BLOOD BANK

## 2024-04-08 LAB — CBC
HCT: 35.4 % — ABNORMAL LOW (ref 36.0–46.0)
Hemoglobin: 11.6 g/dL — ABNORMAL LOW (ref 12.0–15.0)
MCH: 25.6 pg — ABNORMAL LOW (ref 26.0–34.0)
MCHC: 32.8 g/dL (ref 30.0–36.0)
MCV: 78 fL — ABNORMAL LOW (ref 80.0–100.0)
Platelets: 416 10*3/uL — ABNORMAL HIGH (ref 150–400)
RBC: 4.54 MIL/uL (ref 3.87–5.11)
RDW: 20.3 % — ABNORMAL HIGH (ref 11.5–15.5)
WBC: 7.2 10*3/uL (ref 4.0–10.5)
nRBC: 0 % (ref 0.0–0.2)

## 2024-04-08 LAB — RETIC PANEL
Immature Retic Fract: 7.5 % (ref 2.3–15.9)
RBC.: 4.64 MIL/uL (ref 3.87–5.11)
Retic Count, Absolute: 38 10*3/uL (ref 19.0–186.0)
Retic Ct Pct: 0.8 % (ref 0.4–3.1)
Reticulocyte Hemoglobin: 31.1 pg (ref >27.9)

## 2024-04-08 LAB — IRON AND IRON BINDING CAPACITY (CC-WL,HP ONLY)
Iron: 71 ug/dL (ref 28–170)
Saturation Ratios: 21 % (ref 10.4–31.8)
TIBC: 343 ug/dL (ref 250–450)
UIBC: 272 ug/dL (ref 148–442)

## 2024-04-08 LAB — FERRITIN: Ferritin: 16 ng/mL (ref 11–307)

## 2024-04-08 MED ORDER — CYANOCOBALAMIN 1000 MCG/ML IJ SOLN
1000.0000 ug | Freq: Once | INTRAMUSCULAR | Status: AC
  Administered 2024-04-08: 1000 ug via INTRAMUSCULAR
  Filled 2024-04-08: qty 1

## 2024-04-08 NOTE — Progress Notes (Signed)
 Hematology and Oncology Follow Up Visit  Angel Byrd [id number] [birth year] 28 y.o. 04/08/2024  Past Medical History:  Diagnosis Date   HTN (hypertension)    Iron  deficiency anemia     Principle Diagnosis:  IDA Menorrhagia  Sickle cell trait Malnutrition   Current Therapy:   IV Iron - Venofer - 1 infusion on 01/24/2024 B12 IM once monthly- one injection given on 01/24/2024  PriorTherapy:   Oral iron - failed- not effective      Interim History:  Angel Byrd is back for follow-up for her IDA and B12 deficiency.    She has a history of sickle cell trait which was diagnosed after birth. She does believe that she has had anemia for most of her life.     Labs ( 11/22/2023) prior to her first visit with us  on 01/23/2024 showed a Hgb of 7.7, MCV of 65, platelet count of 472.Normal WBC. Normal creatinine/GFR. Her last set of iron  studies performed on 07/19/2023 showed an iron  saturation of 5%, TIBC of 359, ferritin of 6.   At her first visit with us  her Hgb was 7.1 with an MCV of 63.5. She also had reactive thrombocytosis with a platelet value of 559. B12 level was 180, ferritin was 4, iron  saturation was 10%. Following her first visit she was given 1 unit of blood, 1 treatment of Venofer  IV iron , and one infusion of B12. She was also prescribed folic acid  given her sickle cell trait. She was due to return weekly for IV Venofer  but she appears to have had loss of follow up.   Today she returns stating that overall she is feeling a bit better since starting on her medications. Fatigue is improved, no SOB. No longer craving ice. She tolerated blood, B12 and Venofer  well.  Menstrual cycle started yesterday. Very heavy. Working with GYN regarding this. No other bleeding episodes.   She is taking the folic acid  most days. She takes oral iron  daily. She is not taking B12 currently as she forgot.   Wt Readings from Last 3 Encounters:  04/08/24 186 lb (84.4 kg)  02/15/24 184 lb (83.5 kg)   01/23/24 185 lb 12.8 oz (84.3 kg)     Medications:   Current Outpatient Medications:    amLODipine  (NORVASC ) 5 MG tablet, Take 1 tablet (5 mg total) by mouth at bedtime., Disp: 90 tablet, Rfl: 2   Blood Pressure Monitoring (OMRON 3 SERIES BP MONITOR) DEVI, Use to monitor blood pressure as directed, Disp: 1 each, Rfl: 0   escitalopram  (LEXAPRO ) 10 MG tablet, Take 1 tablet (10 mg total) by mouth daily., Disp: 90 tablet, Rfl: 1   folic acid  (FOLVITE ) 1 MG tablet, Take 1 tablet (1 mg total) by mouth daily., Disp: 90 tablet, Rfl: 3   Iron -FA-B Cmp-C-Biot-Probiotic (FUSION PLUS) CAPS, Take 1 capsule by mouth daily., Disp: 30 capsule, Rfl: 3   norethindrone -ethinyl estradiol -FE (JUNEL  FE 1/20) 1-20 MG-MCG tablet, Take 1 tablet by mouth daily., Disp: 84 tablet, Rfl: 4   propranolol  (INDERAL ) 20 MG tablet, Take 1 tablet (20 mg total) by mouth 2 (two) times daily. Take as needed., Disp: 60 tablet, Rfl: 3   Safety Seal Miscellaneous MISC, Melaxemic cream with tranexamic acid 5% kojic acid usp 2% vit c usp 2.5% hyaluronic acid excp 0.1% use twice a day on problem areas., Disp: 15 g, Rfl: 0   Semaglutide -Weight Management (WEGOVY ) 2.4 MG/0.75ML SOAJ, Inject 2.4 mg into the skin once a week., Disp: 3 mL, Rfl: 3   tretinoin  (RETIN-A ) 0.05 %  cream, Apply pea size amount 2 times a week at bedtime. After 1 month increase to using 3 times weekly at night., Disp: 45 g, Rfl: 0   zaleplon  (SONATA ) 10 MG capsule, Take 1 capsule (10 mg total) by mouth daily., Disp: 30 capsule, Rfl: 3   zolpidem  (AMBIEN ) 10 MG tablet, Take 1 tablet (10 mg total) by mouth daily., Disp: 30 tablet, Rfl: 3  Allergies: No Known Allergies  Past Medical History, Surgical history, Social history, and Family History were reviewed and updated.  Review of Systems: Review of Systems  Constitutional: Negative.   HENT:  Negative.    Eyes: Negative.   Respiratory: Negative.    Cardiovascular: Negative.   Gastrointestinal: Negative.    Endocrine: Negative.   Genitourinary: Negative.    Musculoskeletal: Negative.   Skin: Negative.   Neurological: Negative.   Hematological: Negative.   Psychiatric/Behavioral: Negative.     Physical Exam:  weight is 186 lb (84.4 kg). Her temperature is 98.2 F (36.8 C). Her blood pressure is 124/89 and her pulse is 102 (abnormal). Her respiration is 19 and oxygen saturation is 100%.   Physical Exam General: NAD Cardiovascular: regular rate and rhythm Pulmonary: clear ant fields Abdomen: soft, nontender, + bowel sounds GU: no suprapubic tenderness Extremities: no edema, no joint deformities Skin: no rashes Neurological: Weakness but otherwise nonfocal   Lab Results  Component Value Date   WBC 7.2 04/08/2024   HGB 11.6 (L) 04/08/2024   HCT 35.4 (L) 04/08/2024   MCV 78.0 (L) 04/08/2024   PLT 416 (H) 04/08/2024     Chemistry      Component Value Date/Time   NA 140 11/22/2023 1141   K 4.0 11/22/2023 1141   CL 103 11/22/2023 1141   CO2 20 11/22/2023 1141   BUN 7 11/22/2023 1141   CREATININE 0.65 11/22/2023 1141   CREATININE 0.68 08/29/2019 0825   GLU 82 05/09/2018 0000      Component Value Date/Time   CALCIUM  9.2 11/22/2023 1141   ALKPHOS 61 11/22/2023 1141   AST 12 11/22/2023 1141   AST 11 (L) 08/29/2019 0825   ALT 5 11/22/2023 1141   ALT 10 08/29/2019 0825   BILITOT 0.5 11/22/2023 1141   BILITOT 0.3 08/29/2019 0825     Encounter Diagnoses  Name Primary?   Iron  deficiency anemia due to chronic blood loss Yes   Nutritional deficiency    Sickle cell trait (HCC)    B12 deficiency    Symptomatic anemia     Assessment and Plan- Patient is a 28 y.o. female for IDA contributed to heavy menses, nutritional deficiency who also has a history of sickle cell trait.   Very happy to see her improvement in her symptoms and labs with IV iron , B12 and 1 unit of blood. Fortunatley she does not need blood products tomorrow.   She will get a B12 injection today Iron   studies pending- will schedule for IV iron  if needed   Disposition: No blood needed tomorrow B12 injection today RTC monthly for B12 x 12 RTC 2 months APP, labs, injection    Sunnie England PA-C 6/2/202510:37 AM

## 2024-04-09 ENCOUNTER — Other Ambulatory Visit (HOSPITAL_COMMUNITY): Payer: Self-pay

## 2024-04-09 LAB — ERYTHROPOIETIN: Erythropoietin: 21.5 m[IU]/mL — ABNORMAL HIGH (ref 2.6–18.5)

## 2024-04-16 ENCOUNTER — Inpatient Hospital Stay

## 2024-04-16 VITALS — BP 116/87 | HR 90 | Temp 98.1°F | Resp 17

## 2024-04-16 DIAGNOSIS — D75838 Other thrombocytosis: Secondary | ICD-10-CM | POA: Diagnosis not present

## 2024-04-16 DIAGNOSIS — D500 Iron deficiency anemia secondary to blood loss (chronic): Secondary | ICD-10-CM

## 2024-04-16 DIAGNOSIS — Z79899 Other long term (current) drug therapy: Secondary | ICD-10-CM | POA: Diagnosis not present

## 2024-04-16 DIAGNOSIS — N920 Excessive and frequent menstruation with regular cycle: Secondary | ICD-10-CM | POA: Diagnosis not present

## 2024-04-16 DIAGNOSIS — I10 Essential (primary) hypertension: Secondary | ICD-10-CM | POA: Diagnosis not present

## 2024-04-16 DIAGNOSIS — E46 Unspecified protein-calorie malnutrition: Secondary | ICD-10-CM | POA: Diagnosis not present

## 2024-04-16 DIAGNOSIS — D573 Sickle-cell trait: Secondary | ICD-10-CM | POA: Diagnosis not present

## 2024-04-16 MED ORDER — SODIUM CHLORIDE 0.9 % IV SOLN: INTRAVENOUS | Status: DC

## 2024-04-16 MED ORDER — SODIUM CHLORIDE 0.9 % IV SOLN
300.0000 mg | Freq: Once | INTRAVENOUS | Status: AC
  Administered 2024-04-16: 300 mg via INTRAVENOUS
  Filled 2024-04-16: qty 300

## 2024-04-16 NOTE — Patient Instructions (Signed)

## 2024-04-17 ENCOUNTER — Ambulatory Visit: Admitting: Allergy

## 2024-04-19 ENCOUNTER — Other Ambulatory Visit (HOSPITAL_COMMUNITY): Payer: Self-pay

## 2024-04-23 ENCOUNTER — Other Ambulatory Visit (HOSPITAL_COMMUNITY): Payer: Self-pay

## 2024-04-23 ENCOUNTER — Inpatient Hospital Stay

## 2024-04-23 VITALS — BP 122/87 | HR 99 | Temp 98.2°F | Resp 18

## 2024-04-23 DIAGNOSIS — D75838 Other thrombocytosis: Secondary | ICD-10-CM | POA: Diagnosis not present

## 2024-04-23 DIAGNOSIS — I10 Essential (primary) hypertension: Secondary | ICD-10-CM | POA: Diagnosis not present

## 2024-04-23 DIAGNOSIS — N920 Excessive and frequent menstruation with regular cycle: Secondary | ICD-10-CM | POA: Diagnosis not present

## 2024-04-23 DIAGNOSIS — D500 Iron deficiency anemia secondary to blood loss (chronic): Secondary | ICD-10-CM

## 2024-04-23 DIAGNOSIS — Z79899 Other long term (current) drug therapy: Secondary | ICD-10-CM | POA: Diagnosis not present

## 2024-04-23 DIAGNOSIS — E46 Unspecified protein-calorie malnutrition: Secondary | ICD-10-CM | POA: Diagnosis not present

## 2024-04-23 DIAGNOSIS — D573 Sickle-cell trait: Secondary | ICD-10-CM | POA: Diagnosis not present

## 2024-04-23 MED ORDER — SODIUM CHLORIDE 0.9 % IV SOLN: INTRAVENOUS | Status: DC

## 2024-04-23 MED ORDER — SODIUM CHLORIDE 0.9 % IV SOLN
300.0000 mg | Freq: Once | INTRAVENOUS | Status: AC
  Administered 2024-04-23: 300 mg via INTRAVENOUS
  Filled 2024-04-23: qty 300

## 2024-04-23 NOTE — Patient Instructions (Signed)

## 2024-04-26 ENCOUNTER — Other Ambulatory Visit (HOSPITAL_COMMUNITY): Payer: Self-pay

## 2024-04-30 ENCOUNTER — Inpatient Hospital Stay

## 2024-04-30 VITALS — BP 115/87 | HR 88 | Resp 16

## 2024-04-30 DIAGNOSIS — D500 Iron deficiency anemia secondary to blood loss (chronic): Secondary | ICD-10-CM | POA: Diagnosis not present

## 2024-04-30 DIAGNOSIS — Z79899 Other long term (current) drug therapy: Secondary | ICD-10-CM | POA: Diagnosis not present

## 2024-04-30 DIAGNOSIS — D573 Sickle-cell trait: Secondary | ICD-10-CM | POA: Diagnosis not present

## 2024-04-30 DIAGNOSIS — E46 Unspecified protein-calorie malnutrition: Secondary | ICD-10-CM | POA: Diagnosis not present

## 2024-04-30 DIAGNOSIS — N920 Excessive and frequent menstruation with regular cycle: Secondary | ICD-10-CM | POA: Diagnosis not present

## 2024-04-30 DIAGNOSIS — I10 Essential (primary) hypertension: Secondary | ICD-10-CM | POA: Diagnosis not present

## 2024-04-30 DIAGNOSIS — D75838 Other thrombocytosis: Secondary | ICD-10-CM | POA: Diagnosis not present

## 2024-04-30 MED ORDER — SODIUM CHLORIDE 0.9 % IV SOLN
300.0000 mg | Freq: Once | INTRAVENOUS | Status: AC
  Administered 2024-04-30: 300 mg via INTRAVENOUS
  Filled 2024-04-30: qty 300

## 2024-04-30 MED ORDER — IRON SUCROSE 300 MG IVPB - SIMPLE MED: 300.0000 mg | Freq: Once | Status: DC

## 2024-04-30 MED ORDER — SODIUM CHLORIDE 0.9 % IV SOLN: Freq: Once | INTRAVENOUS | Status: AC

## 2024-04-30 NOTE — Progress Notes (Signed)
Patient refused to wait 30 minutes post infusion. Released stable and ASX. 

## 2024-04-30 NOTE — Patient Instructions (Signed)

## 2024-05-03 ENCOUNTER — Other Ambulatory Visit (HOSPITAL_COMMUNITY): Payer: Self-pay

## 2024-05-03 ENCOUNTER — Encounter: Payer: Self-pay | Admitting: Hematology & Oncology

## 2024-05-06 ENCOUNTER — Inpatient Hospital Stay

## 2024-05-13 ENCOUNTER — Inpatient Hospital Stay

## 2024-05-15 ENCOUNTER — Inpatient Hospital Stay: Attending: Medical Oncology

## 2024-05-15 VITALS — BP 121/83 | HR 98 | Temp 97.6°F | Resp 17

## 2024-05-15 DIAGNOSIS — E538 Deficiency of other specified B group vitamins: Secondary | ICD-10-CM | POA: Diagnosis not present

## 2024-05-15 DIAGNOSIS — Z79899 Other long term (current) drug therapy: Secondary | ICD-10-CM | POA: Diagnosis not present

## 2024-05-15 DIAGNOSIS — N920 Excessive and frequent menstruation with regular cycle: Secondary | ICD-10-CM | POA: Insufficient documentation

## 2024-05-15 DIAGNOSIS — D500 Iron deficiency anemia secondary to blood loss (chronic): Secondary | ICD-10-CM | POA: Insufficient documentation

## 2024-05-15 MED ORDER — CYANOCOBALAMIN 1000 MCG/ML IJ SOLN
1000.0000 ug | Freq: Once | INTRAMUSCULAR | Status: AC
  Administered 2024-05-15: 1000 ug via INTRAMUSCULAR
  Filled 2024-05-15: qty 1

## 2024-05-15 NOTE — Patient Instructions (Signed)
 Vitamin B12 Injection What is this medication? Vitamin B12 (VAHY tuh min B12) prevents and treats low vitamin B12 levels in your body. It is used in people who do not get enough vitamin B12 from their diet or when their digestive tract does not absorb enough. Vitamin B12 plays an important role in maintaining the health of your nervous system and red blood cells. This medicine may be used for other purposes; ask your health care provider or pharmacist if you have questions. COMMON BRAND NAME(S): B-12 Compliance Kit, B-12 Injection Kit, Cyomin, Dodex, LA-12, Nutri-Twelve, Physicians EZ Use B-12, Primabalt, Vitamin Deficiency Injectable System - B12 What should I tell my care team before I take this medication? They need to know if you have any of these conditions: Kidney disease Leber's disease Megaloblastic anemia An unusual or allergic reaction to cyanocobalamin, cobalt, other medications, foods, dyes, or preservatives Pregnant or trying to get pregnant Breast-feeding How should I use this medication? This medication is injected into a muscle or deeply under the skin. It is usually given in a clinic or care team's office. However, your care team may teach you how to inject yourself. Follow all instructions. Talk to your care team about the use of this medication in children. Special care may be needed. Overdosage: If you think you have taken too much of this medicine contact a poison control center or emergency room at once. NOTE: This medicine is only for you. Do not share this medicine with others. What if I miss a dose? If you are given your dose at a clinic or care team's office, call to reschedule your appointment. If you give your own injections, and you miss a dose, take it as soon as you can. If it is almost time for your next dose, take only that dose. Do not take double or extra doses. What may interact with this medication? Alcohol Colchicine This list may not describe all possible  interactions. Give your health care provider a list of all the medicines, herbs, non-prescription drugs, or dietary supplements you use. Also tell them if you smoke, drink alcohol, or use illegal drugs. Some items may interact with your medicine. What should I watch for while using this medication? Visit your care team regularly. You may need blood work done while you are taking this medication. You may need to follow a special diet. Talk to your care team. Limit your alcohol intake and avoid smoking to get the best benefit. What side effects may I notice from receiving this medication? Side effects that you should report to your care team as soon as possible: Allergic reactions--skin rash, itching, hives, swelling of the face, lips, tongue, or throat Swelling of the ankles, hands, or feet Trouble breathing Side effects that usually do not require medical attention (report to your care team if they continue or are bothersome): Diarrhea This list may not describe all possible side effects. Call your doctor for medical advice about side effects. You may report side effects to FDA at 1-800-FDA-1088. Where should I keep my medication? Keep out of the reach of children. Store at room temperature between 15 and 30 degrees C (59 and 85 degrees F). Protect from light. Throw away any unused medication after the expiration date. NOTE: This sheet is a summary. It may not cover all possible information. If you have questions about this medicine, talk to your doctor, pharmacist, or health care provider.  2024 Elsevier/Gold Standard (2021-07-06 00:00:00)

## 2024-06-03 ENCOUNTER — Inpatient Hospital Stay

## 2024-06-03 ENCOUNTER — Inpatient Hospital Stay: Admitting: Medical Oncology

## 2024-06-04 ENCOUNTER — Encounter: Payer: Self-pay | Admitting: Hematology & Oncology

## 2024-06-04 ENCOUNTER — Other Ambulatory Visit (HOSPITAL_COMMUNITY): Payer: Self-pay

## 2024-06-13 ENCOUNTER — Encounter: Payer: Self-pay | Admitting: Medical Oncology

## 2024-06-13 ENCOUNTER — Inpatient Hospital Stay

## 2024-06-13 ENCOUNTER — Ambulatory Visit: Payer: Self-pay | Admitting: Medical Oncology

## 2024-06-13 ENCOUNTER — Inpatient Hospital Stay (HOSPITAL_BASED_OUTPATIENT_CLINIC_OR_DEPARTMENT_OTHER): Admitting: Medical Oncology

## 2024-06-13 ENCOUNTER — Inpatient Hospital Stay: Attending: Medical Oncology

## 2024-06-13 VITALS — BP 115/77 | HR 95 | Temp 99.2°F | Resp 18 | Ht 61.0 in | Wt 190.0 lb

## 2024-06-13 DIAGNOSIS — D500 Iron deficiency anemia secondary to blood loss (chronic): Secondary | ICD-10-CM | POA: Insufficient documentation

## 2024-06-13 DIAGNOSIS — E639 Nutritional deficiency, unspecified: Secondary | ICD-10-CM | POA: Diagnosis not present

## 2024-06-13 DIAGNOSIS — I10 Essential (primary) hypertension: Secondary | ICD-10-CM | POA: Insufficient documentation

## 2024-06-13 DIAGNOSIS — Z79899 Other long term (current) drug therapy: Secondary | ICD-10-CM | POA: Diagnosis not present

## 2024-06-13 DIAGNOSIS — E538 Deficiency of other specified B group vitamins: Secondary | ICD-10-CM | POA: Insufficient documentation

## 2024-06-13 DIAGNOSIS — D75838 Other thrombocytosis: Secondary | ICD-10-CM | POA: Diagnosis not present

## 2024-06-13 DIAGNOSIS — N920 Excessive and frequent menstruation with regular cycle: Secondary | ICD-10-CM | POA: Diagnosis not present

## 2024-06-13 DIAGNOSIS — E46 Unspecified protein-calorie malnutrition: Secondary | ICD-10-CM | POA: Diagnosis not present

## 2024-06-13 DIAGNOSIS — D573 Sickle-cell trait: Secondary | ICD-10-CM | POA: Diagnosis not present

## 2024-06-13 DIAGNOSIS — D649 Anemia, unspecified: Secondary | ICD-10-CM

## 2024-06-13 LAB — RETIC PANEL
Immature Retic Fract: 9.1 % (ref 2.3–15.9)
RBC.: 4.36 MIL/uL (ref 3.87–5.11)
Retic Count, Absolute: 65.4 K/uL (ref 19.0–186.0)
Retic Ct Pct: 1.5 % (ref 0.4–3.1)
Reticulocyte Hemoglobin: 33.5 pg (ref >27.9)

## 2024-06-13 LAB — CBC WITH DIFFERENTIAL (CANCER CENTER ONLY)
Abs Immature Granulocytes: 0.02 K/uL (ref 0.00–0.07)
Basophils Absolute: 0 K/uL (ref 0.0–0.1)
Basophils Relative: 1 %
Eosinophils Absolute: 0.1 K/uL (ref 0.0–0.5)
Eosinophils Relative: 1 %
HCT: 36.3 % (ref 36.0–46.0)
Hemoglobin: 12.1 g/dL (ref 12.0–15.0)
Immature Granulocytes: 0 %
Lymphocytes Relative: 35 %
Lymphs Abs: 2.7 K/uL (ref 0.7–4.0)
MCH: 28.1 pg (ref 26.0–34.0)
MCHC: 33.3 g/dL (ref 30.0–36.0)
MCV: 84.2 fL (ref 80.0–100.0)
Monocytes Absolute: 0.4 K/uL (ref 0.1–1.0)
Monocytes Relative: 5 %
Neutro Abs: 4.4 K/uL (ref 1.7–7.7)
Neutrophils Relative %: 58 %
Platelet Count: 355 K/uL (ref 150–400)
RBC: 4.31 MIL/uL (ref 3.87–5.11)
RDW: 13.8 % (ref 11.5–15.5)
WBC Count: 7.6 K/uL (ref 4.0–10.5)
nRBC: 0 % (ref 0.0–0.2)

## 2024-06-13 LAB — IRON AND IRON BINDING CAPACITY (CC-WL,HP ONLY)
Iron: 54 ug/dL (ref 28–170)
Saturation Ratios: 21 % (ref 10.4–31.8)
TIBC: 263 ug/dL (ref 250–450)
UIBC: 209 ug/dL

## 2024-06-13 LAB — SAMPLE TO BLOOD BANK

## 2024-06-13 LAB — FERRITIN: Ferritin: 60 ng/mL (ref 11–307)

## 2024-06-13 LAB — VITAMIN B12: Vitamin B-12: 249 pg/mL (ref 180–914)

## 2024-06-13 MED ORDER — CYANOCOBALAMIN 1000 MCG/ML IJ SOLN
1000.0000 ug | Freq: Once | INTRAMUSCULAR | Status: AC
  Administered 2024-06-13: 1000 ug via INTRAMUSCULAR
  Filled 2024-06-13: qty 1

## 2024-06-13 NOTE — Patient Instructions (Signed)
 Vitamin B12 Injection What is this medication? Vitamin B12 (VAHY tuh min B12) prevents and treats low vitamin B12 levels in your body. It is used in people who do not get enough vitamin B12 from their diet or when their digestive tract does not absorb enough. Vitamin B12 plays an important role in maintaining the health of your nervous system and red blood cells. This medicine may be used for other purposes; ask your health care provider or pharmacist if you have questions. COMMON BRAND NAME(S): B-12 Compliance Kit, B-12 Injection Kit, Cyomin, Dodex, LA-12, Nutri-Twelve, Physicians EZ Use B-12, Primabalt, Vitamin Deficiency Injectable System - B12 What should I tell my care team before I take this medication? They need to know if you have any of these conditions: Kidney disease Leber's disease Megaloblastic anemia An unusual or allergic reaction to cyanocobalamin, cobalt, other medications, foods, dyes, or preservatives Pregnant or trying to get pregnant Breast-feeding How should I use this medication? This medication is injected into a muscle or deeply under the skin. It is usually given in a clinic or care team's office. However, your care team may teach you how to inject yourself. Follow all instructions. Talk to your care team about the use of this medication in children. Special care may be needed. Overdosage: If you think you have taken too much of this medicine contact a poison control center or emergency room at once. NOTE: This medicine is only for you. Do not share this medicine with others. What if I miss a dose? If you are given your dose at a clinic or care team's office, call to reschedule your appointment. If you give your own injections, and you miss a dose, take it as soon as you can. If it is almost time for your next dose, take only that dose. Do not take double or extra doses. What may interact with this medication? Alcohol Colchicine This list may not describe all possible  interactions. Give your health care provider a list of all the medicines, herbs, non-prescription drugs, or dietary supplements you use. Also tell them if you smoke, drink alcohol, or use illegal drugs. Some items may interact with your medicine. What should I watch for while using this medication? Visit your care team regularly. You may need blood work done while you are taking this medication. You may need to follow a special diet. Talk to your care team. Limit your alcohol intake and avoid smoking to get the best benefit. What side effects may I notice from receiving this medication? Side effects that you should report to your care team as soon as possible: Allergic reactions--skin rash, itching, hives, swelling of the face, lips, tongue, or throat Swelling of the ankles, hands, or feet Trouble breathing Side effects that usually do not require medical attention (report to your care team if they continue or are bothersome): Diarrhea This list may not describe all possible side effects. Call your doctor for medical advice about side effects. You may report side effects to FDA at 1-800-FDA-1088. Where should I keep my medication? Keep out of the reach of children. Store at room temperature between 15 and 30 degrees C (59 and 85 degrees F). Protect from light. Throw away any unused medication after the expiration date. NOTE: This sheet is a summary. It may not cover all possible information. If you have questions about this medicine, talk to your doctor, pharmacist, or health care provider.  2024 Elsevier/Gold Standard (2021-07-06 00:00:00)

## 2024-06-13 NOTE — Progress Notes (Signed)
 Hematology and Oncology Follow Up Visit  Analyse Angst [id number] [birth year] 28 y.o. 06/13/2024  Past Medical History:  Diagnosis Date   HTN (hypertension)    Iron  deficiency anemia     Principle Diagnosis:  IDA Menorrhagia  Sickle cell trait Malnutrition   Current Therapy:   IV Iron - Venofer - 1 infusion on 04/30/2024 B12 IM once monthly- given here  PriorTherapy:   Oral iron - failed- not effective  Transfusion PRBC 01/24/2024     Interim History:  Ms. Ellerman is back for follow-up for her IDA and B12 deficiency.    She has a history of sickle cell trait which was diagnosed after birth. She does believe that she has had anemia for most of her life.     Labs ( 11/22/2023) prior to her first visit with us  on 01/23/2024 showed a Hgb of 7.7, MCV of 65, platelet count of 472.Normal WBC. Normal creatinine/GFR. Her last set of iron  studies performed on 07/19/2023 showed an iron  saturation of 5%, TIBC of 359, ferritin of 6.   At her first visit with us  her Hgb was 7.1 with an MCV of 63.5. She also had reactive thrombocytosis with a platelet value of 559. B12 level was 180, ferritin was 4, iron  saturation was 10%. Following her first visit she was given 1 unit of blood, 1 treatment of Venofer  IV iron , and one infusion of B12. She was also prescribed folic acid  given her sickle cell trait. She was due to return weekly for IV Venofer  but she appears to have had loss of follow up.   Today she states that she is doing well. All her symptoms of fatigue, SOB, pica have resolved. She tolerated blood, B12 and Venofer  well.  Menstrual cycles are regular lasting 5 days and are heavy in nature. Working with GYN regarding this. No other bleeding episodes.   She is taking the folic acid  most days. She takes oral iron  daily.   Wt Readings from Last 3 Encounters:  06/13/24 190 lb (86.2 kg)  04/08/24 186 lb (84.4 kg)  02/15/24 184 lb (83.5 kg)     Medications:   Current Outpatient  Medications:    amLODipine  (NORVASC ) 5 MG tablet, Take 1 tablet (5 mg total) by mouth at bedtime., Disp: 90 tablet, Rfl: 2   Blood Pressure Monitoring (OMRON 3 SERIES BP MONITOR) DEVI, Use to monitor blood pressure as directed, Disp: 1 each, Rfl: 0   escitalopram  (LEXAPRO ) 10 MG tablet, Take 1 tablet (10 mg total) by mouth daily., Disp: 90 tablet, Rfl: 1   folic acid  (FOLVITE ) 1 MG tablet, Take 1 tablet (1 mg total) by mouth daily., Disp: 90 tablet, Rfl: 3   Iron -FA-B Cmp-C-Biot-Probiotic (FUSION PLUS) CAPS, Take 1 capsule by mouth daily., Disp: 30 capsule, Rfl: 3   norethindrone -ethinyl estradiol -FE (JUNEL  FE 1/20) 1-20 MG-MCG tablet, Take 1 tablet by mouth daily., Disp: 84 tablet, Rfl: 4   propranolol  (INDERAL ) 20 MG tablet, Take 1 tablet (20 mg total) by mouth 2 (two) times daily. Take as needed., Disp: 60 tablet, Rfl: 3   Safety Seal Miscellaneous MISC, Melaxemic cream with tranexamic acid 5% kojic acid usp 2% vit c usp 2.5% hyaluronic acid excp 0.1% use twice a day on problem areas., Disp: 15 g, Rfl: 0   Semaglutide -Weight Management (WEGOVY ) 2.4 MG/0.75ML SOAJ, Inject 2.4 mg into the skin once a week., Disp: 3 mL, Rfl: 3   tretinoin  (RETIN-A ) 0.05 % cream, Apply pea size amount 2 times a week at bedtime. After 1  month increase to using 3 times weekly at night., Disp: 45 g, Rfl: 0   zaleplon  (SONATA ) 10 MG capsule, Take 1 capsule (10 mg total) by mouth daily., Disp: 30 capsule, Rfl: 3   zolpidem  (AMBIEN ) 10 MG tablet, Take 1 tablet (10 mg total) by mouth daily., Disp: 30 tablet, Rfl: 3  Allergies: No Known Allergies  Past Medical History, Surgical history, Social history, and Family History were reviewed and updated.  Review of Systems: Review of Systems  Constitutional: Negative.   HENT:  Negative.    Eyes: Negative.   Respiratory: Negative.    Cardiovascular: Negative.   Gastrointestinal: Negative.   Endocrine: Negative.   Genitourinary: Negative.    Musculoskeletal: Negative.    Skin: Negative.   Neurological: Negative.   Hematological: Negative.   Psychiatric/Behavioral: Negative.     Physical Exam:  height is 5' 1 (1.549 m) and weight is 190 lb (86.2 kg). Her oral temperature is 99.2 F (37.3 C). Her blood pressure is 115/77 and her pulse is 95. Her respiration is 18 and oxygen saturation is 100%.   Physical Exam General: NAD Cardiovascular: regular rate and rhythm Pulmonary: clear ant fields Abdomen: soft, nontender, + bowel sounds GU: no suprapubic tenderness Extremities: no edema, no joint deformities Skin: no rashes Neurological: Weakness but otherwise nonfocal   Lab Results  Component Value Date   WBC 7.6 06/13/2024   HGB 12.1 06/13/2024   HCT 36.3 06/13/2024   MCV 84.2 06/13/2024   PLT 355 06/13/2024     Chemistry      Component Value Date/Time   NA 140 11/22/2023 1141   K 4.0 11/22/2023 1141   CL 103 11/22/2023 1141   CO2 20 11/22/2023 1141   BUN 7 11/22/2023 1141   CREATININE 0.65 11/22/2023 1141   CREATININE 0.68 08/29/2019 0825   GLU 82 05/09/2018 0000      Component Value Date/Time   CALCIUM  9.2 11/22/2023 1141   ALKPHOS 61 11/22/2023 1141   AST 12 11/22/2023 1141   AST 11 (L) 08/29/2019 0825   ALT 5 11/22/2023 1141   ALT 10 08/29/2019 0825   BILITOT 0.5 11/22/2023 1141   BILITOT 0.3 08/29/2019 0825     Encounter Diagnoses  Name Primary?   Iron  deficiency anemia due to chronic blood loss Yes   Nutritional deficiency    Sickle cell trait (HCC)    B12 deficiency    Assessment and Plan- Patient is a 28 y.o. female for IDA contributed to heavy menses, nutritional deficiency who also has a history of sickle cell trait.   CBC looks great Iron  and B12 levels pending  She will get a B12 injection today Iron  studies pending- will schedule for IV iron  if needed   Disposition: No blood needed tomorrow B12 injection today RTC monthly for B12 x 12 RTC 3 months APP, labs, injection    Lauraine Dais  PA-C 8/7/20259:58 AM

## 2024-06-24 ENCOUNTER — Encounter: Payer: Self-pay | Admitting: Internal Medicine

## 2024-06-24 ENCOUNTER — Ambulatory Visit: Payer: BC Managed Care – PPO | Admitting: Internal Medicine

## 2024-06-24 ENCOUNTER — Other Ambulatory Visit (HOSPITAL_COMMUNITY): Payer: Self-pay

## 2024-06-24 VITALS — BP 118/86 | HR 85 | Temp 98.2°F | Ht 61.0 in | Wt 189.0 lb

## 2024-06-24 DIAGNOSIS — E66812 Obesity, class 2: Secondary | ICD-10-CM | POA: Diagnosis not present

## 2024-06-24 DIAGNOSIS — E6609 Other obesity due to excess calories: Secondary | ICD-10-CM | POA: Diagnosis not present

## 2024-06-24 DIAGNOSIS — I10 Essential (primary) hypertension: Secondary | ICD-10-CM | POA: Diagnosis not present

## 2024-06-24 DIAGNOSIS — Z6835 Body mass index (BMI) 35.0-35.9, adult: Secondary | ICD-10-CM

## 2024-06-24 MED ORDER — AMLODIPINE BESYLATE 5 MG PO TABS
5.0000 mg | ORAL_TABLET | Freq: Every day | ORAL | 2 refills | Status: AC
  Filled 2024-06-24: qty 90, 90d supply, fill #0
  Filled 2024-07-06: qty 30, 30d supply, fill #0
  Filled 2024-08-12: qty 30, 30d supply, fill #1
  Filled 2024-09-16: qty 30, 30d supply, fill #2
  Filled 2024-10-22: qty 30, 30d supply, fill #3
  Filled 2024-11-17: qty 30, 30d supply, fill #4

## 2024-06-24 NOTE — Assessment & Plan Note (Signed)
 Discussed obesity management focusing on exercise and strength training. Wegovy  may have plateaued. Considered weight loss surgery with referral for evaluation. - Refer to weight loss surgery program for evaluation, including orientation, nutritionist consultation, and mental health evaluation. - Encourage exercise, including strength training, daily. - Continue Wegovy  and optimize protein intake

## 2024-06-24 NOTE — Patient Instructions (Signed)
 Hypertension, Adult Hypertension is another name for high blood pressure. High blood pressure forces your heart to work harder to pump blood. This can cause problems over time. There are two numbers in a blood pressure reading. There is a top number (systolic) over a bottom number (diastolic). It is best to have a blood pressure that is below 120/80. What are the causes? The cause of this condition is not known. Some other conditions can lead to high blood pressure. What increases the risk? Some lifestyle factors can make you more likely to develop high blood pressure: Smoking. Not getting enough exercise or physical activity. Being overweight. Having too much fat, sugar, calories, or salt (sodium) in your diet. Drinking too much alcohol . Other risk factors include: Having any of these conditions: Heart disease. Diabetes. High cholesterol. Kidney disease. Obstructive sleep apnea. Having a family history of high blood pressure and high cholesterol. Age. The risk increases with age. Stress. What are the signs or symptoms? High blood pressure may not cause symptoms. Very high blood pressure (hypertensive crisis) may cause: Headache. Fast or uneven heartbeats (palpitations). Shortness of breath. Nosebleed. Vomiting or feeling like you may vomit (nauseous). Changes in how you see. Very bad chest pain. Feeling dizzy. Seizures. How is this treated? This condition is treated by making healthy lifestyle changes, such as: Eating healthy foods. Exercising more. Drinking less alcohol . Your doctor may prescribe medicine if lifestyle changes do not help enough and if: Your top number is above 130. Your bottom number is above 80. Your personal target blood pressure may vary. Follow these instructions at home: Eating and drinking  If told, follow the DASH eating plan. To follow this plan: Fill one half of your plate at each meal with fruits and vegetables. Fill one fourth of your plate  at each meal with whole grains. Whole grains include whole-wheat pasta, brown rice, and whole-grain bread. Eat or drink low-fat dairy products, such as skim milk or low-fat yogurt. Fill one fourth of your plate at each meal with low-fat (lean) proteins. Low-fat proteins include fish, chicken without skin, eggs, beans, and tofu. Avoid fatty meat, cured and processed meat, or chicken with skin. Avoid pre-made or processed food. Limit the amount of salt in your diet to less than 1,500 mg each day. Do not drink alcohol  if: Your doctor tells you not to drink. You are pregnant, may be pregnant, or are planning to become pregnant. If you drink alcohol : Limit how much you have to: 0-1 drink a day for women. 0-2 drinks a day for men. Know how much alcohol  is in your drink. In the [place] one drink equals one 12 oz bottle of beer (355 mL), one 5 oz glass of wine (148 mL), or one 1 oz glass of hard liquor (44 mL). Lifestyle  Work with your doctor to stay at a healthy weight or to lose weight. Ask your doctor what the best weight is for you. Get at least 30 minutes of exercise that causes your heart to beat faster (aerobic exercise) most days of the week. This may include walking, swimming, or biking. Get at least 30 minutes of exercise that strengthens your muscles (resistance exercise) at least 3 days a week. This may include lifting weights or doing Pilates. Do not smoke or use any products that contain nicotine  or tobacco. If you need help quitting, ask your doctor. Check your blood pressure at home as told by your doctor. Keep all follow-up visits. Medicines Take over-the-counter and prescription medicines  only as told by your doctor. Follow directions carefully. Do not skip doses of blood pressure medicine. The medicine does not work as well if you skip doses. Skipping doses also puts you at risk for problems. Ask your doctor about side effects or reactions to medicines that you should watch  for. Contact a doctor if: You think you are having a reaction to the medicine you are taking. You have headaches that keep coming back. You feel dizzy. You have swelling in your ankles. You have trouble with your vision. Get help right away if: You get a very bad headache. You start to feel mixed up (confused). You feel weak or numb. You feel faint. You have very bad pain in your: Chest. Belly (abdomen). You vomit more than once. You have trouble breathing. These symptoms may be an emergency. Get help right away. Call 911. Do not wait to see if the symptoms will go away. Do not drive yourself to the hospital. Summary Hypertension is another name for high blood pressure. High blood pressure forces your heart to work harder to pump blood. For most people, a normal blood pressure is less than 120/80. Making healthy choices can help lower blood pressure. If your blood pressure does not get lower with healthy choices, you may need to take medicine. This information is not intended to replace advice given to you by your health care provider. Make sure you discuss any questions you have with your health care provider. Document Revised: 08/12/2021 Document Reviewed: 08/12/2021 Elsevier Patient Education  2024 [employer].

## 2024-06-24 NOTE — Assessment & Plan Note (Signed)
 Chronic, initial blood pressure is elevated.  Repeat BP is .   She will continue with amlodipine  5mg  daily. Encouraged to follow low sodium diet.

## 2024-06-24 NOTE — Progress Notes (Signed)
 I,Angel Byrd, CMA,acting as a Neurosurgeon for Angel LOISE Slocumb, MD.,have documented all relevant documentation on the behalf of Angel LOISE Slocumb, MD,as directed by  Angel LOISE Slocumb, MD while in the presence of Angel LOISE Slocumb, MD.  Subjective:  Patient ID: Angel Byrd , female    DOB: 1996-08-19 , 28 y.o.   MRN: [id number]  Chief Complaint  Patient presents with   Hypertension    HPI Discussed the use of AI scribe software for clinical note transcription with the patient, who gave verbal consent to proceed.  History of Present Illness Angel Byrd is a 28 year old female who presents for a blood pressure check and referral to a weight management program.  She reports compliance with amlodipine , denies cp and shortness of breath.   She is considering weight loss surgery and is interested in the process, which includes meeting with a nutritionist and undergoing a mental health evaluation. She emphasizes the importance of maintaining good muscle tone and healthy fats in her diet.  She has started going to the gym during her lunchtime, primarily using the treadmill as other equipment in her apartment complex gym is not functional. She experienced an episode of feeling faint due to overexertion and lack of hydration. She is considering joining a regular gym that offers daycare services to accommodate her schedule and childcare needs.  She has been taking Wegovy  but feels her body may have become accustomed to it.  Her son, Angel Byrd, has been a distraction during her workouts at home, leading her to prefer exercising without him present. She has taken him out of daycare to prepare for school and save money, which has added to her challenges in finding time for exercise.   Hypertension This is a chronic problem. The current episode started more than 1 year ago. The problem has been gradually improving since onset. The problem is uncontrolled. Risk factors for coronary artery disease include  obesity and sedentary lifestyle. Past treatments include calcium  channel blockers and beta blockers. The current treatment provides moderate improvement. Compliance problems include exercise.      Past Medical History:  Diagnosis Date   HTN (hypertension)    Iron  deficiency anemia      Family History  Problem Relation Age of Onset   Sickle cell anemia Mother    Heart disease Father    Hypertension Sister      Current Outpatient Medications:    Blood Pressure Monitoring (OMRON 3 SERIES BP MONITOR) DEVI, Use to monitor blood pressure as directed, Disp: 1 each, Rfl: 0   escitalopram  (LEXAPRO ) 10 MG tablet, Take 1 tablet (10 mg total) by mouth daily., Disp: 90 tablet, Rfl: 1   folic acid  (FOLVITE ) 1 MG tablet, Take 1 tablet (1 mg total) by mouth daily., Disp: 90 tablet, Rfl: 3   Iron -FA-B Cmp-C-Biot-Probiotic (FUSION PLUS) CAPS, Take 1 capsule by mouth daily., Disp: 30 capsule, Rfl: 3   norethindrone -ethinyl estradiol -FE (JUNEL  FE 1/20) 1-20 MG-MCG tablet, Take 1 tablet by mouth daily., Disp: 84 tablet, Rfl: 4   propranolol  (INDERAL ) 20 MG tablet, Take 1 tablet (20 mg total) by mouth 2 (two) times daily. Take as needed., Disp: 60 tablet, Rfl: 3   Safety Seal Miscellaneous MISC, Melaxemic cream with tranexamic acid 5% kojic acid usp 2% vit c usp 2.5% hyaluronic acid excp 0.1% use twice a day on problem areas., Disp: 15 g, Rfl: 0   Semaglutide -Weight Management (WEGOVY ) 2.4 MG/0.75ML SOAJ, Inject 2.4 mg into the skin once a week.,  Disp: 3 mL, Rfl: 3   tretinoin  (RETIN-A ) 0.05 % cream, Apply pea size amount 2 times a week at bedtime. After 1 month increase to using 3 times weekly at night., Disp: 45 g, Rfl: 0   zaleplon  (SONATA ) 10 MG capsule, Take 1 capsule (10 mg total) by mouth daily., Disp: 30 capsule, Rfl: 3   zolpidem  (AMBIEN ) 10 MG tablet, Take 1 tablet (10 mg total) by mouth daily., Disp: 30 tablet, Rfl: 3   amLODipine  (NORVASC ) 5 MG tablet, Take 1 tablet (5 mg total) by mouth at  bedtime., Disp: 90 tablet, Rfl: 2   No Known Allergies   Review of Systems  Constitutional: Negative.   HENT: Negative.    Respiratory: Negative.  Negative for wheezing.   Cardiovascular: Negative.   Gastrointestinal: Negative.      Today's Vitals   06/24/24 0837 06/24/24 0902  BP: (!) 120/90 118/86  Pulse: 85   Temp: 98.2 F (36.8 C)   TempSrc: Oral   Weight: 189 lb (85.7 kg)   Height: 5' 1 (1.549 m)   PainSc: 0-No pain    Body mass index is 35.71 kg/m.  Wt Readings from Last 3 Encounters:  06/24/24 189 lb (85.7 kg)  06/13/24 190 lb (86.2 kg)  04/08/24 186 lb (84.4 kg)    The ASCVD Risk score (Arnett DK, et al., 2019) failed to calculate for the following reasons:   The 2019 ASCVD risk score is only valid for ages 57 to 63  Objective:  Physical Exam Vitals and nursing note reviewed.  Constitutional:      Appearance: Normal appearance.  HENT:     Head: Normocephalic and atraumatic.  Eyes:     Extraocular Movements: Extraocular movements intact.  Cardiovascular:     Rate and Rhythm: Regular rhythm.     Heart sounds: Normal heart sounds.  Pulmonary:     Effort: Pulmonary effort is normal.     Breath sounds: Normal breath sounds.  Musculoskeletal:     Cervical back: Normal range of motion.  Skin:    General: Skin is warm.  Neurological:     General: No focal deficit present.     Mental Status: She is alert.  Psychiatric:        Mood and Affect: Mood normal.        Behavior: Behavior normal.    Assessment And Plan:  Essential hypertension, benign Assessment & Plan: Chronic, initial blood pressure is elevated.  Repeat BP is .   She will continue with amlodipine  5mg  daily. Encouraged to follow low sodium diet.   Orders: -     CMP14+EGFR -     amLODIPine  Besylate; Take 1 tablet (5 mg total) by mouth at bedtime.  Dispense: 90 tablet; Refill: 2  Class 2 obesity due to excess calories without serious comorbidity with body mass index (BMI) of 35.0 to 35.9 in  adult Assessment & Plan: Discussed obesity management focusing on exercise and strength training. Wegovy  may have plateaued. Considered weight loss surgery with referral for evaluation. - Refer to weight loss surgery program for evaluation, including orientation, nutritionist consultation, and mental health evaluation. - Encourage exercise, including strength training, daily. - Continue Wegovy  and optimize protein intake  Orders: -     Amb Ref to Medical Weight Management   Return if symptoms worsen or fail to improve.  Patient was given opportunity to ask questions. Patient verbalized understanding of the plan and was able to repeat key elements of the plan. All questions  were answered to their satisfaction.   I, Angel LOISE Slocumb, MD, have reviewed all documentation for this visit. The documentation on 06/24/24 for the exam, diagnosis, procedures, and orders are all accurate and complete.   IF YOU HAVE BEEN REFERRED TO A SPECIALIST, IT MAY TAKE 1-2 WEEKS TO SCHEDULE/PROCESS THE REFERRAL. IF YOU HAVE NOT HEARD FROM US /SPECIALIST IN TWO WEEKS, PLEASE GIVE US  A CALL AT [phone].

## 2024-06-25 LAB — CMP14+EGFR
ALT: 8 IU/L (ref 0–32)
AST: 12 IU/L (ref 0–40)
Albumin: 4.6 g/dL (ref 4.0–5.0)
Alkaline Phosphatase: 64 IU/L (ref 44–121)
BUN/Creatinine Ratio: 5 — ABNORMAL LOW (ref 9–23)
BUN: 5 mg/dL — ABNORMAL LOW (ref 6–20)
Bilirubin Total: 0.5 mg/dL (ref 0.0–1.2)
CO2: 22 mmol/L (ref 20–29)
Calcium: 9.3 mg/dL (ref 8.7–10.2)
Chloride: 104 mmol/L (ref 96–106)
Creatinine, Ser: 0.91 mg/dL (ref 0.57–1.00)
Globulin, Total: 3.1 g/dL (ref 1.5–4.5)
Glucose: 76 mg/dL (ref 70–99)
Potassium: 4.1 mmol/L (ref 3.5–5.2)
Sodium: 139 mmol/L (ref 134–144)
Total Protein: 7.7 g/dL (ref 6.0–8.5)
eGFR: 87 mL/min/1.73 (ref >59)

## 2024-06-27 ENCOUNTER — Ambulatory Visit: Payer: Self-pay | Admitting: Internal Medicine

## 2024-07-06 ENCOUNTER — Encounter: Payer: Self-pay | Admitting: Hematology & Oncology

## 2024-07-06 ENCOUNTER — Other Ambulatory Visit (HOSPITAL_COMMUNITY): Payer: Self-pay

## 2024-07-07 ENCOUNTER — Other Ambulatory Visit: Payer: Self-pay

## 2024-07-10 ENCOUNTER — Encounter: Payer: Self-pay | Admitting: Hematology & Oncology

## 2024-07-10 ENCOUNTER — Other Ambulatory Visit (HOSPITAL_COMMUNITY): Payer: Self-pay

## 2024-07-10 DIAGNOSIS — F331 Major depressive disorder, recurrent, moderate: Secondary | ICD-10-CM | POA: Diagnosis not present

## 2024-07-10 MED ORDER — PROPRANOLOL HCL 20 MG PO TABS
20.0000 mg | ORAL_TABLET | Freq: Two times a day (BID) | ORAL | 3 refills | Status: AC
  Filled 2024-07-10: qty 60, 30d supply, fill #0

## 2024-07-10 MED ORDER — BUPROPION HCL ER (XL) 150 MG PO TB24
150.0000 mg | ORAL_TABLET | Freq: Every morning | ORAL | 3 refills | Status: AC
  Filled 2024-07-10: qty 30, 30d supply, fill #0

## 2024-07-10 MED ORDER — ZALEPLON 10 MG PO CAPS
10.0000 mg | ORAL_CAPSULE | Freq: Every day | ORAL | 3 refills | Status: AC
  Filled 2024-07-10: qty 30, 30d supply, fill #0

## 2024-07-10 MED ORDER — ESCITALOPRAM OXALATE 20 MG PO TABS
20.0000 mg | ORAL_TABLET | Freq: Every day | ORAL | 3 refills | Status: AC
  Filled 2024-07-10: qty 30, 30d supply, fill #0

## 2024-07-11 ENCOUNTER — Other Ambulatory Visit: Payer: Self-pay | Admitting: Internal Medicine

## 2024-07-12 ENCOUNTER — Other Ambulatory Visit (HOSPITAL_COMMUNITY): Payer: Self-pay

## 2024-07-12 MED ORDER — WEGOVY 2.4 MG/0.75ML ~~LOC~~ SOAJ
2.4000 mg | SUBCUTANEOUS | 3 refills | Status: DC
  Filled 2024-07-12: qty 3, 28d supply, fill #0
  Filled 2024-08-20: qty 3, 28d supply, fill #1

## 2024-07-17 DIAGNOSIS — F331 Major depressive disorder, recurrent, moderate: Secondary | ICD-10-CM | POA: Diagnosis not present

## 2024-07-18 ENCOUNTER — Inpatient Hospital Stay

## 2024-08-07 ENCOUNTER — Inpatient Hospital Stay: Attending: Medical Oncology

## 2024-08-13 ENCOUNTER — Other Ambulatory Visit (HOSPITAL_COMMUNITY): Payer: Self-pay

## 2024-08-13 ENCOUNTER — Encounter: Payer: Self-pay | Admitting: Hematology & Oncology

## 2024-08-21 ENCOUNTER — Other Ambulatory Visit (HOSPITAL_COMMUNITY): Payer: Self-pay

## 2024-08-22 ENCOUNTER — Inpatient Hospital Stay

## 2024-09-03 ENCOUNTER — Other Ambulatory Visit (HOSPITAL_COMMUNITY): Payer: Self-pay

## 2024-09-03 ENCOUNTER — Ambulatory Visit: Attending: Internal Medicine

## 2024-09-03 ENCOUNTER — Ambulatory Visit (INDEPENDENT_AMBULATORY_CARE_PROVIDER_SITE_OTHER): Admitting: Internal Medicine

## 2024-09-03 ENCOUNTER — Encounter: Payer: Self-pay | Admitting: Internal Medicine

## 2024-09-03 VITALS — BP 116/80 | HR 103 | Temp 98.3°F | Ht 61.0 in | Wt 191.0 lb

## 2024-09-03 DIAGNOSIS — Z23 Encounter for immunization: Secondary | ICD-10-CM | POA: Diagnosis not present

## 2024-09-03 DIAGNOSIS — E66812 Obesity, class 2: Secondary | ICD-10-CM

## 2024-09-03 DIAGNOSIS — E6609 Other obesity due to excess calories: Secondary | ICD-10-CM

## 2024-09-03 DIAGNOSIS — R002 Palpitations: Secondary | ICD-10-CM

## 2024-09-03 DIAGNOSIS — D500 Iron deficiency anemia secondary to blood loss (chronic): Secondary | ICD-10-CM

## 2024-09-03 DIAGNOSIS — E538 Deficiency of other specified B group vitamins: Secondary | ICD-10-CM

## 2024-09-03 DIAGNOSIS — Z6836 Body mass index (BMI) 36.0-36.9, adult: Secondary | ICD-10-CM

## 2024-09-03 DIAGNOSIS — I10 Essential (primary) hypertension: Secondary | ICD-10-CM

## 2024-09-03 DIAGNOSIS — F419 Anxiety disorder, unspecified: Secondary | ICD-10-CM

## 2024-09-03 MED ORDER — WEGOVY 2.4 MG/0.75ML ~~LOC~~ SOAJ
2.4000 mg | SUBCUTANEOUS | 3 refills | Status: AC
  Filled 2024-09-03 – 2024-09-09 (×2): qty 3, 28d supply, fill #0
  Filled 2024-10-04: qty 3, 28d supply, fill #1
  Filled 2024-11-07 – 2024-11-08 (×3): qty 3, 28d supply, fill #2

## 2024-09-03 MED ORDER — CYANOCOBALAMIN 1000 MCG/ML IJ SOLN
1000.0000 ug | Freq: Once | INTRAMUSCULAR | Status: AC
  Administered 2024-09-03: 1000 ug via INTRAMUSCULAR

## 2024-09-03 NOTE — Assessment & Plan Note (Signed)
 Chronic, currently stable. Also followed by Dr. Darra.  On Lexapro  10 mg, prefers 10 mg dose. - Refill Lexapro  10 mg prescription. - Continue with Wellbutrin  XL 150mg  daily.  - Advise use of pill cutter if only 20 mg tablets available. - Recommend magnesium supplementation for anxiety.

## 2024-09-03 NOTE — Assessment & Plan Note (Signed)
 Chronic, fair control.  Goal BP <120/80.    She will continue with amlodipine  5mg  daily. Encouraged to follow low sodium diet.  - Encouraged to incorporate more exercise into her daily routine.

## 2024-09-03 NOTE — Assessment & Plan Note (Signed)
 Interested in weight loss surgery with approval received. Concerns about surgery reversibility discussed. Wegovy  use considered for discontinuation due to palpitations. - Renew Wegovy  prescription. - Send referral for weight loss surgery. - Follow up in 12 weeks.

## 2024-09-03 NOTE — Assessment & Plan Note (Signed)
 Experiences palpitations and anxiety, especially in stress. Propranolol  prescribed but not taken as directed. Consideration of heart monitor and Wegovy 's impact on palpitations discussed. - Instruct to take propranolol  during episodes of increased anxiety or palpitations. - Consider wearing a heart monitor for 14 days to assess palpitations. - Discuss potential discontinuation of Wegovy  if palpitations worsen. - She agrees to Zio patch.

## 2024-09-03 NOTE — Assessment & Plan Note (Signed)
 Mixed anemia with low B12 levels. She has not improved after iron  infusions and missed B12 shots due to travel. - Check blood count, iron  levels, and B12 level. - Administer B12 injection after blood draw. - Schedule nurse visits for B12 injections closer to home.

## 2024-09-03 NOTE — Progress Notes (Unsigned)
 EP to read.

## 2024-09-03 NOTE — Progress Notes (Signed)
 I,Angel Byrd, CMA,acting as a neurosurgeon for Angel LOISE Slocumb, MD.,have documented all relevant documentation on the behalf of Angel LOISE Slocumb, MD,as directed by  Angel LOISE Slocumb, MD while in the presence of Angel LOISE Slocumb, MD.  Subjective:  Patient ID: Angel Byrd , female    DOB: 20-Sep-1996 , 28 y.o.   MRN: [id number]  Chief Complaint  Patient presents with   Weight Check    Patient feels like the wegovy  is not working for her. She feels like she has been gaining weight.   Hypertension    Patient presents today for a bpc. Patient reports compliance with her meds. Patient reports she has been having slight headaches, she is unsure if it is due to her bp.    HPI Discussed the use of AI scribe software for clinical note transcription with the patient, who gave verbal consent to proceed.  History of Present Illness Angel Byrd is a 28 year old female with hypertension and mixed anemia who presents for a blood pressure check and B12 shot.  She experiences headaches, which she does not attribute to her blood pressure. Her blood count was good in August, but she has not felt better since then. She receives B12 shots monthly and missed the second one due to travel inconvenience. She prefers receiving the B12 shot locally to avoid long travel distances.  She reports never feeling better after iron  infusions. She feels more tired post-infusions and has bruises on her arms from previous blood draws. She takes iron  pills occasionally but notices no difference.  She experiences heart palpitations, particularly in crowded situations or when stressed, such as when her son Worth is 'getting on her nerves.' She was prescribed propranolol  but has not been taking it regularly. She feels anxious about having a heart attack due to stress and is considering wearing a monitor to track her heart rhythm.  She has been prescribed medication for anxiety and heart palpitations but feels it is not  effective. She has not refilled the prescription for heart palpitations as she feels the medication does not help. She uses regular sleeping medicine instead of Sonata  or Ambien  due to side effects like nightmares and frequent waking.  She is currently on Wegovy  and is interested in weight loss surgery, despite her sisters' disapproval. She inquires about the possibility of reversing the surgery if needed.  She does not take any vitamins and expresses interest in starting a multivitamin regimen. She reports drinking enough water and is interested in finding a suitable multivitamin, considering options from health food stores.   Hypertension This is a chronic problem. The current episode started more than 1 year ago. The problem has been gradually improving since onset. The problem is uncontrolled. Associated symptoms include headaches and palpitations. Risk factors for coronary artery disease include obesity and sedentary lifestyle. Past treatments include calcium  channel blockers and beta blockers. The current treatment provides moderate improvement. Compliance problems include exercise.      Past Medical History:  Diagnosis Date   HTN (hypertension)    Iron  deficiency anemia      Family History  Problem Relation Age of Onset   Sickle cell anemia Mother    Heart disease Father    Hypertension Sister      Current Outpatient Medications:    amLODipine  (NORVASC ) 5 MG tablet, Take 1 tablet (5 mg total) by mouth at bedtime., Disp: 90 tablet, Rfl: 2   Blood Pressure Monitoring (OMRON 3 SERIES BP MONITOR) DEVI, Use to  monitor blood pressure as directed, Disp: 1 each, Rfl: 0   buPROPion  (WELLBUTRIN  XL) 150 MG 24 hr tablet, Take 1 tablet (150 mg total) by mouth every morning., Disp: 30 tablet, Rfl: 3   escitalopram  (LEXAPRO ) 10 MG tablet, Take 1 tablet (10 mg total) by mouth daily., Disp: 90 tablet, Rfl: 1   escitalopram  (LEXAPRO ) 20 MG tablet, Take 1 tablet (20 mg total) by mouth daily., Disp:  30 tablet, Rfl: 3   folic acid  (FOLVITE ) 1 MG tablet, Take 1 tablet (1 mg total) by mouth daily., Disp: 90 tablet, Rfl: 3   Iron -FA-B Cmp-C-Biot-Probiotic (FUSION PLUS) CAPS, Take 1 capsule by mouth daily., Disp: 30 capsule, Rfl: 3   norethindrone -ethinyl estradiol -FE (JUNEL  FE 1/20) 1-20 MG-MCG tablet, Take 1 tablet by mouth daily., Disp: 84 tablet, Rfl: 4   propranolol  (INDERAL ) 20 MG tablet, Take 1 tablet (20 mg total) by mouth 2 (two) times daily. Take as needed., Disp: 60 tablet, Rfl: 3   propranolol  (INDERAL ) 20 MG tablet, Take 1 tablet (20 mg total) by mouth 2 (two) times daily., Disp: 60 tablet, Rfl: 3   Safety Seal Miscellaneous MISC, Melaxemic cream with tranexamic acid 5% kojic acid usp 2% vit c usp 2.5% hyaluronic acid excp 0.1% use twice a day on problem areas., Disp: 15 g, Rfl: 0   tretinoin  (RETIN-A ) 0.05 % cream, Apply pea size amount 2 times a week at bedtime. After 1 month increase to using 3 times weekly at night., Disp: 45 g, Rfl: 0   zaleplon  (SONATA ) 10 MG capsule, Take 1 capsule (10 mg total) by mouth daily., Disp: 30 capsule, Rfl: 3   zolpidem  (AMBIEN ) 10 MG tablet, Take 1 tablet (10 mg total) by mouth daily., Disp: 30 tablet, Rfl: 3   semaglutide -weight management (WEGOVY ) 2.4 MG/0.75ML SOAJ SQ injection, Inject 2.4 mg into the skin once a week., Disp: 3 mL, Rfl: 3   No Known Allergies   Review of Systems  Constitutional: Negative.   Respiratory: Negative.    Cardiovascular:  Positive for palpitations.  Gastrointestinal: Negative.   Musculoskeletal: Negative.   Skin: Negative.   Neurological:  Positive for headaches.  Psychiatric/Behavioral:  The patient is nervous/anxious.      Today's Vitals   09/03/24 1615  BP: 116/80  Pulse: (!) 103  Temp: 98.3 F (36.8 C)  TempSrc: Oral  Weight: 191 lb (86.6 kg)  Height: 5' 1 (1.549 m)  PainSc: 5   PainLoc: Head   Body mass index is 36.09 kg/m.  Wt Readings from Last 3 Encounters:  09/03/24 191 lb (86.6 kg)   06/24/24 189 lb (85.7 kg)  06/13/24 190 lb (86.2 kg)    The ASCVD Risk score (Arnett DK, et al., 2019) failed to calculate for the following reasons:   The 2019 ASCVD risk score is only valid for ages 36 to 75  Objective:  Physical Exam Vitals and nursing note reviewed.  Constitutional:      Appearance: Normal appearance. She is obese.  HENT:     Head: Normocephalic and atraumatic.  Eyes:     Extraocular Movements: Extraocular movements intact.  Cardiovascular:     Rate and Rhythm: Regular rhythm. Tachycardia present.     Heart sounds: Normal heart sounds.  Pulmonary:     Effort: Pulmonary effort is normal.     Breath sounds: Normal breath sounds.  Musculoskeletal:     Cervical back: Normal range of motion.  Skin:    General: Skin is warm.  Neurological:  General: No focal deficit present.     Mental Status: She is alert.  Psychiatric:        Mood and Affect: Mood normal.        Behavior: Behavior normal.         Assessment And Plan:  Essential hypertension, benign Assessment & Plan: Chronic, fair control.  Goal BP <120/80.    She will continue with amlodipine  5mg  daily. Encouraged to follow low sodium diet.  - Encouraged to incorporate more exercise into her daily routine.    Class 2 obesity due to excess calories without serious comorbidity with body mass index (BMI) of 36.0 to 36.9 in adult Assessment & Plan: Interested in weight loss surgery with approval received. Concerns about surgery reversibility discussed. Wegovy  use considered for discontinuation due to palpitations. - Renew Wegovy  prescription. - Send referral for weight loss surgery. - Follow up in 12 weeks.    Vitamin B12 deficiency -     CBC -     Vitamin B12 -     Cyanocobalamin   Iron  deficiency anemia due to chronic blood loss Assessment & Plan: Mixed anemia with low B12 levels. She has not improved after iron  infusions and missed B12 shots due to travel. - Check blood count, iron   levels, and B12 level. - Administer B12 injection after blood draw. - Schedule nurse visits for B12 injections closer to home.  Orders: -     CBC -     Iron , TIBC and Ferritin Panel  Palpitations Assessment & Plan: Experiences palpitations and anxiety, especially in stress. Propranolol  prescribed but not taken as directed. Consideration of heart monitor and Wegovy 's impact on palpitations discussed. - Instruct to take propranolol  during episodes of increased anxiety or palpitations. - Consider wearing a heart monitor for 14 days to assess palpitations. - Discuss potential discontinuation of Wegovy  if palpitations worsen. - She agrees to Zio patch.   Orders: -     LONG TERM MONITOR (3-14 DAYS); Future  Anxiety Assessment & Plan: Chronic, currently stable. Also followed by Dr. Darra.  On Lexapro  10 mg, prefers 10 mg dose. - Refill Lexapro  10 mg prescription. - Continue with Wellbutrin  XL 150mg  daily.  - Advise use of pill cutter if only 20 mg tablets available. - Recommend magnesium supplementation for anxiety.   Need for influenza vaccination -     Flu vaccine trivalent PF, 6mos and older(Flulaval,Afluria,Fluarix,Fluzone)  Other orders -     Wegovy ; Inject 2.4 mg into the skin once a week.  Dispense: 3 mL; Refill: 3   Return if symptoms worsen or fail to improve.  Patient was given opportunity to ask questions. Patient verbalized understanding of the plan and was able to repeat key elements of the plan. All questions were answered to their satisfaction.    I, Angel LOISE Slocumb, MD, have reviewed all documentation for this visit. The documentation on 09/03/24 for the exam, diagnosis, procedures, and orders are all accurate and complete.   IF YOU HAVE BEEN REFERRED TO A SPECIALIST, IT MAY TAKE 1-2 WEEKS TO SCHEDULE/PROCESS THE REFERRAL. IF YOU HAVE NOT HEARD FROM US /SPECIALIST IN TWO WEEKS, PLEASE GIVE US  A CALL AT [phone].

## 2024-09-03 NOTE — Patient Instructions (Signed)
 Hypertension, Adult Hypertension is another name for high blood pressure. High blood pressure forces your heart to work harder to pump blood. This can cause problems over time. There are two numbers in a blood pressure reading. There is a top number (systolic) over a bottom number (diastolic). It is best to have a blood pressure that is below 120/80. What are the causes? The cause of this condition is not known. Some other conditions can lead to high blood pressure. What increases the risk? Some lifestyle factors can make you more likely to develop high blood pressure: Smoking. Not getting enough exercise or physical activity. Being overweight. Having too much fat, sugar, calories, or salt (sodium) in your diet. Drinking too much alcohol. Other risk factors include: Having any of these conditions: Heart disease. Diabetes. High cholesterol. Kidney disease. Obstructive sleep apnea. Having a family history of high blood pressure and high cholesterol. Age. The risk increases with age. Stress. What are the signs or symptoms? High blood pressure may not cause symptoms. Very high blood pressure (hypertensive crisis) may cause: Headache. Fast or uneven heartbeats (palpitations). Shortness of breath. Nosebleed. Vomiting or feeling like you may vomit (nauseous). Changes in how you see. Very bad chest pain. Feeling dizzy. Seizures. How is this treated? This condition is treated by making healthy lifestyle changes, such as: Eating healthy foods. Exercising more. Drinking less alcohol. Your doctor may prescribe medicine if lifestyle changes do not help enough and if: Your top number is above 130. Your bottom number is above 80. Your personal target blood pressure may vary. Follow these instructions at home: Eating and drinking  If told, follow the DASH eating plan. To follow this plan: Fill one half of your plate at each meal with fruits and vegetables. Fill one fourth of your plate  at each meal with whole grains. Whole grains include whole-wheat pasta, brown rice, and whole-grain bread. Eat or drink low-fat dairy products, such as skim milk or low-fat yogurt. Fill one fourth of your plate at each meal with low-fat (lean) proteins. Low-fat proteins include fish, chicken without skin, eggs, beans, and tofu. Avoid fatty meat, cured and processed meat, or chicken with skin. Avoid pre-made or processed food. Limit the amount of salt in your diet to less than 1,500 mg each day. Do not drink alcohol if: Your doctor tells you not to drink. You are pregnant, may be pregnant, or are planning to become pregnant. If you drink alcohol: Limit how much you have to: 0-1 drink a day for women. 0-2 drinks a day for men. Know how much alcohol is in your drink. In the [place] one drink equals one 12 oz bottle of beer (355 mL), one 5 oz glass of wine (148 mL), or one 1 oz glass of hard liquor (44 mL). Lifestyle  Work with your doctor to stay at a healthy weight or to lose weight. Ask your doctor what the best weight is for you. Get at least 30 minutes of exercise that causes your heart to beat faster (aerobic exercise) most days of the week. This may include walking, swimming, or biking. Get at least 30 minutes of exercise that strengthens your muscles (resistance exercise) at least 3 days a week. This may include lifting weights or doing Pilates. Do not smoke or use any products that contain nicotine or tobacco. If you need help quitting, ask your doctor. Check your blood pressure at home as told by your doctor. Keep all follow-up visits. Medicines Take over-the-counter and prescription medicines  only as told by your doctor. Follow directions carefully. Do not skip doses of blood pressure medicine. The medicine does not work as well if you skip doses. Skipping doses also puts you at risk for problems. Ask your doctor about side effects or reactions to medicines that you should watch  for. Contact a doctor if: You think you are having a reaction to the medicine you are taking. You have headaches that keep coming back. You feel dizzy. You have swelling in your ankles. You have trouble with your vision. Get help right away if: You get a very bad headache. You start to feel mixed up (confused). You feel weak or numb. You feel faint. You have very bad pain in your: Chest. Belly (abdomen). You vomit more than once. You have trouble breathing. These symptoms may be an emergency. Get help right away. Call 911. Do not wait to see if the symptoms will go away. Do not drive yourself to the hospital. Summary Hypertension is another name for high blood pressure. High blood pressure forces your heart to work harder to pump blood. For most people, a normal blood pressure is less than 120/80. Making healthy choices can help lower blood pressure. If your blood pressure does not get lower with healthy choices, you may need to take medicine. This information is not intended to replace advice given to you by your health care provider. Make sure you discuss any questions you have with your health care provider. Document Revised: 08/12/2021 Document Reviewed: 08/12/2021 Elsevier Patient Education  2024 [employer].

## 2024-09-04 LAB — CBC
Hematocrit: 36.8 % (ref 34.0–46.6)
Hemoglobin: 12.2 g/dL (ref 11.1–15.9)
MCH: 29 pg (ref 26.6–33.0)
MCHC: 33.2 g/dL (ref 31.5–35.7)
MCV: 88 fL (ref 79–97)
Platelets: 388 x10E3/uL (ref 150–450)
RBC: 4.2 x10E6/uL (ref 3.77–5.28)
RDW: 13.4 % (ref 11.7–15.4)
WBC: 7.1 x10E3/uL (ref 3.4–10.8)

## 2024-09-04 LAB — IRON,TIBC AND FERRITIN PANEL
Ferritin: 51 ng/mL (ref 15–150)
Iron Saturation: 15 % (ref 15–55)
Iron: 34 ug/dL (ref 27–159)
Total Iron Binding Capacity: 224 ug/dL — ABNORMAL LOW (ref 250–450)
UIBC: 190 ug/dL (ref 131–425)

## 2024-09-04 LAB — VITAMIN B12: Vitamin B-12: 274 pg/mL (ref 232–1245)

## 2024-09-10 ENCOUNTER — Other Ambulatory Visit (HOSPITAL_COMMUNITY): Payer: Self-pay

## 2024-09-12 ENCOUNTER — Ambulatory Visit: Admitting: Dermatology

## 2024-09-16 ENCOUNTER — Encounter: Payer: Self-pay | Admitting: Hematology & Oncology

## 2024-09-16 ENCOUNTER — Other Ambulatory Visit (HOSPITAL_COMMUNITY): Payer: Self-pay

## 2024-09-18 ENCOUNTER — Ambulatory Visit: Payer: Self-pay | Admitting: Internal Medicine

## 2024-09-19 ENCOUNTER — Inpatient Hospital Stay: Admitting: Medical Oncology

## 2024-09-19 ENCOUNTER — Inpatient Hospital Stay

## 2024-09-19 ENCOUNTER — Inpatient Hospital Stay: Attending: Medical Oncology

## 2024-09-26 NOTE — Progress Notes (Signed)
 Lam Bjorklund                                          MRN: [id number]   09/26/2024   The [facility] Quality Team Specialist reviewed this patient medical record for the purposes of chart review for care gap closure. The following were reviewed: abstraction for care gap closure-controlling blood pressure.    [facility] Quality Team

## 2024-10-17 DIAGNOSIS — R002 Palpitations: Secondary | ICD-10-CM | POA: Diagnosis not present

## 2024-10-22 ENCOUNTER — Other Ambulatory Visit (HOSPITAL_COMMUNITY): Payer: Self-pay

## 2024-10-22 ENCOUNTER — Encounter: Payer: Self-pay | Admitting: Hematology & Oncology

## 2024-10-23 ENCOUNTER — Other Ambulatory Visit: Payer: Self-pay

## 2024-10-23 ENCOUNTER — Other Ambulatory Visit (HOSPITAL_COMMUNITY): Payer: Self-pay

## 2024-10-23 ENCOUNTER — Encounter: Payer: Self-pay | Admitting: Hematology & Oncology

## 2024-10-23 DIAGNOSIS — F332 Major depressive disorder, recurrent severe without psychotic features: Secondary | ICD-10-CM | POA: Diagnosis not present

## 2024-10-23 MED ORDER — ZALEPLON 10 MG PO CAPS
10.0000 mg | ORAL_CAPSULE | Freq: Every day | ORAL | 3 refills | Status: AC
  Filled 2024-10-23: qty 30, 30d supply, fill #0

## 2024-10-23 MED ORDER — PROPRANOLOL HCL 20 MG PO TABS
20.0000 mg | ORAL_TABLET | Freq: Two times a day (BID) | ORAL | 3 refills | Status: AC
  Filled 2024-10-23: qty 30, 15d supply, fill #0

## 2024-10-23 MED ORDER — ESCITALOPRAM OXALATE 20 MG PO TABS
20.0000 mg | ORAL_TABLET | Freq: Every day | ORAL | 3 refills | Status: AC
  Filled 2024-10-23: qty 30, 30d supply, fill #0

## 2024-10-23 MED ORDER — BUPROPION HCL ER (XL) 150 MG PO TB24
150.0000 mg | ORAL_TABLET | Freq: Every morning | ORAL | 3 refills | Status: AC
  Filled 2024-10-23: qty 30, 30d supply, fill #0

## 2024-11-04 ENCOUNTER — Other Ambulatory Visit (HOSPITAL_COMMUNITY): Payer: Self-pay

## 2024-11-08 ENCOUNTER — Telehealth (HOSPITAL_COMMUNITY): Payer: Self-pay

## 2024-11-08 ENCOUNTER — Other Ambulatory Visit (HOSPITAL_COMMUNITY): Payer: Self-pay

## 2024-11-08 NOTE — Telephone Encounter (Signed)
 Pharmacy Patient Advocate Encounter  Received notification from [facility] that Prior Authorization for  Wegovy  2.4MG /0.75ML auto-injectors  has been APPROVED from 11/08/24 to 11/08/25. Ran test claim, Copay is $45. This test claim was processed through [facility]- copay amounts may vary at other pharmacies due to pharmacy/plan contracts, or as the patient moves through the different stages of their insurance plan.   PA #/Case ID/Reference #: [id number]

## 2024-11-08 NOTE — Telephone Encounter (Signed)
 Pharmacy Patient Advocate Encounter   Received notification from Pt Calls Messages that prior authorization for Wegovy  2.4MG /0.75ML auto-injectors  is required/requested.   Insurance verification completed.   The patient is insured through [facility].   Per test claim: PA required; PA submitted to above mentioned insurance via Latent Key/confirmation #/EOC [id number] Status is pending

## 2024-11-08 NOTE — Telephone Encounter (Signed)
 PA request has been Received. New Encounter has been or will be created for follow up. For additional info see Pharmacy Prior Auth telephone encounter from 11/08/24.

## 2024-11-18 ENCOUNTER — Other Ambulatory Visit (HOSPITAL_COMMUNITY): Payer: Self-pay

## 2024-11-18 ENCOUNTER — Encounter: Payer: Self-pay | Admitting: Hematology & Oncology

## 2024-11-25 ENCOUNTER — Ambulatory Visit (INDEPENDENT_AMBULATORY_CARE_PROVIDER_SITE_OTHER): Payer: BC Managed Care – PPO | Admitting: Internal Medicine

## 2024-11-25 DIAGNOSIS — Z91199 Patient's noncompliance with other medical treatment and regimen due to unspecified reason: Secondary | ICD-10-CM

## 2024-11-25 NOTE — Progress Notes (Signed)
 Pt is a no show.  RS

## 2024-11-25 NOTE — Patient Instructions (Signed)

## 2024-11-27 ENCOUNTER — Encounter: Payer: Self-pay | Admitting: Internal Medicine

## 2024-11-27 ENCOUNTER — Ambulatory Visit: Admitting: Internal Medicine

## 2024-11-27 ENCOUNTER — Other Ambulatory Visit: Payer: Self-pay | Admitting: Internal Medicine

## 2024-11-27 VITALS — BP 118/80 | HR 97 | Temp 98.3°F | Ht 61.0 in | Wt 185.6 lb

## 2024-11-27 DIAGNOSIS — N6310 Unspecified lump in the right breast, unspecified quadrant: Secondary | ICD-10-CM

## 2024-11-27 DIAGNOSIS — I10 Essential (primary) hypertension: Secondary | ICD-10-CM | POA: Diagnosis not present

## 2024-11-27 DIAGNOSIS — D500 Iron deficiency anemia secondary to blood loss (chronic): Secondary | ICD-10-CM

## 2024-11-27 NOTE — Progress Notes (Signed)
 I,Victoria T Emmitt, CMA,acting as a neurosurgeon for [name], MD.,have documented all relevant documentation on the behalf of [name], MD,as directed by  [name], MD while in the presence of [name], MD.  Subjective:  Patient ID: Angel Byrd , female    DOB: 07/06/1996 , 29 y.o.   MRN: [id number]  Chief Complaint  Patient presents with   Mass    Patient presents today for mass under right breast. She states this mass becoming sore recently. No pain.  Denies any drainage.  She states having a odor coming from the area.     HPI Discussed the use of AI scribe software for clinical note transcription with the patient, who gave verbal consent to proceed.  History of Present Illness Angel Byrd is a 30 year old female who presents with a sore and odorous bump under her right breast.  She has had a persistent bump under her right breast for years, located at the five o'clock position. Previously, an ultrasound was performed, but the bump was not removed. Recently, it has become sore to touch and developed an odor, which was first noticed yesterday and persists despite showering. There is no drainage from the bump.  She has a history of a similar issue on the left breast, which was identified as a cyst and resolved. She uses powder under both breasts, typically in the summer, to manage sweat. She is concerned about the smell and is self-conscious about it.  No pain is reported, only soreness when touched.      Past Medical History:  Diagnosis Date   HTN (hypertension)    Iron  deficiency anemia      Family History  Problem Relation Age of Onset   Sickle cell anemia Mother    Heart disease Father    Hypertension Sister      Current Outpatient Medications:    sulfamethoxazole -trimethoprim  (BACTRIM  DS) 800-160 MG tablet, Take 1 tablet by mouth 2 (two) times daily., Disp: 14 tablet, Rfl: 0   amLODipine  (NORVASC ) 5 MG tablet, Take 1 tablet (5 mg  total) by mouth at bedtime., Disp: 90 tablet, Rfl: 2   Blood Pressure Monitoring (OMRON 3 SERIES BP MONITOR) DEVI, Use to monitor blood pressure as directed, Disp: 1 each, Rfl: 0   buPROPion  (WELLBUTRIN  XL) 150 MG 24 hr tablet, Take 1 tablet (150 mg total) by mouth every morning., Disp: 30 tablet, Rfl: 3   buPROPion  (WELLBUTRIN  XL) 150 MG 24 hr tablet, Take 1 tablet (150 mg total) by mouth every morning., Disp: 30 tablet, Rfl: 3   escitalopram  (LEXAPRO ) 10 MG tablet, Take 1 tablet (10 mg total) by mouth daily., Disp: 90 tablet, Rfl: 1   escitalopram  (LEXAPRO ) 20 MG tablet, Take 1 tablet (20 mg total) by mouth daily., Disp: 30 tablet, Rfl: 3   escitalopram  (LEXAPRO ) 20 MG tablet, Take 1 tablet (20 mg total) by mouth daily., Disp: 30 tablet, Rfl: 3   folic acid  (FOLVITE ) 1 MG tablet, Take 1 tablet (1 mg total) by mouth daily., Disp: 90 tablet, Rfl: 3   Iron -FA-B Cmp-C-Biot-Probiotic (FUSION PLUS) CAPS, Take 1 capsule by mouth daily., Disp: 30 capsule, Rfl: 3   norethindrone -ethinyl estradiol -FE (JUNEL  FE 1/20) 1-20 MG-MCG tablet, Take 1 tablet by mouth daily., Disp: 84 tablet, Rfl: 4   propranolol  (INDERAL ) 20 MG tablet, Take 1 tablet (20 mg total) by mouth 2 (two) times daily. Take as needed., Disp: 60 tablet, Rfl: 3   propranolol  (INDERAL ) 20 MG tablet,  Take 1 tablet (20 mg total) by mouth 2 (two) times daily., Disp: 60 tablet, Rfl: 3   propranolol  (INDERAL ) 20 MG tablet, Take 1 tablet (20 mg total) by mouth 2 (two) times daily., Disp: 30 tablet, Rfl: 3   Safety Seal Miscellaneous MISC, Melaxemic cream with tranexamic acid 5% kojic acid usp 2% vit c usp 2.5% hyaluronic acid excp 0.1% use twice a day on problem areas., Disp: 15 g, Rfl: 0   semaglutide -weight management (WEGOVY ) 2.4 MG/0.75ML SOAJ SQ injection, Inject 2.4 mg into the skin once a week., Disp: 3 mL, Rfl: 3   tretinoin  (RETIN-A ) 0.05 % cream, Apply pea size amount 2 times a week at bedtime. After 1 month increase to using 3 times weekly at  night., Disp: 45 g, Rfl: 0   zaleplon  (SONATA ) 10 MG capsule, Take 1 capsule (10 mg total) by mouth daily., Disp: 30 capsule, Rfl: 3   zaleplon  (SONATA ) 10 MG capsule, Take 1 capsule (10 mg total) by mouth daily., Disp: 30 capsule, Rfl: 3   zolpidem  (AMBIEN ) 10 MG tablet, Take 1 tablet (10 mg total) by mouth daily., Disp: 30 tablet, Rfl: 3   No Known Allergies   Review of Systems  Constitutional: Negative.   Respiratory: Negative.    Cardiovascular: Negative.   Skin:  Positive for rash.       Bump underneath r breast  Neurological: Negative.   Psychiatric/Behavioral: Negative.       Today's Vitals   11/27/24 1106  BP: 118/80  Pulse: 97  Temp: 98.3 F (36.8 C)  SpO2: 98%  Weight: 185 lb 9.6 oz (84.2 kg)  Height: 5' 1 (1.549 m)   Body mass index is 35.07 kg/m.  Wt Readings from Last 3 Encounters:  11/27/24 185 lb 9.6 oz (84.2 kg)  09/03/24 191 lb (86.6 kg)  06/24/24 189 lb (85.7 kg)    The ASCVD Risk score (Arnett DK, et al., 2019) failed to calculate for the following reasons:   The 2019 ASCVD risk score is only valid for ages 50 to 49  Objective:  Physical Exam Vitals and nursing note reviewed.  Constitutional:      Appearance: Normal appearance.  HENT:     Head: Normocephalic and atraumatic.  Eyes:     Extraocular Movements: Extraocular movements intact.  Cardiovascular:     Rate and Rhythm: Normal rate and regular rhythm.     Heart sounds: Normal heart sounds.  Pulmonary:     Effort: Pulmonary effort is normal.     Breath sounds: Normal breath sounds.  Chest:       Comments: Well circumscribed round subcutaneous mass, underneath right breast No overlying erythema No warmth Musculoskeletal:     Cervical back: Normal range of motion.  Skin:    General: Skin is warm.  Neurological:     General: No focal deficit present.     Mental Status: She is alert.  Psychiatric:        Mood and Affect: Mood normal.        Behavior: Behavior normal.          Assessment And Plan:   Assessment & Plan Mass of right breast, unspecified quadrant Persistent mass with soreness and odor, likely sebaceous cyst. Differential includes sebaceous cyst versus other subcutaneous mass. - Ordered ultrasound of right breast. - Consider diagnostic mammogram if indicated by ultrasound. - Advised application of heat. - Recommended powder for odor management. - Consider abx if starts to drain. Essential hypertension, benign Chronic,  fair control.  Goal BP <120/80.    She will continue with amlodipine  5mg  daily. Encouraged to follow low sodium diet.  - Encouraged to incorporate more exercise into her daily routine.  Iron  deficiency anemia due to chronic blood loss Chronic, needs regular Hematology follow up.    Orders Placed This Encounter  Procedures   US  LIMITED ULTRASOUND INCLUDING AXILLA RIGHT BREAST   CMP14+EGFR   CBC no Diff   Iron , TIBC and Ferritin Panel     Return in 6 months (on 05/27/2025), or phys - schedule between May-July.  Patient was given opportunity to ask questions. Patient verbalized understanding of the plan and was able to repeat key elements of the plan. All questions were answered to their satisfaction.    I, [name], MD, have reviewed all documentation for this visit. The documentation on 12/03/24 for the exam, diagnosis, procedures, and orders are all accurate and complete.   IF YOU HAVE BEEN REFERRED TO A SPECIALIST, IT MAY TAKE 1-2 WEEKS TO SCHEDULE/PROCESS THE REFERRAL. IF YOU HAVE NOT HEARD FROM US /SPECIALIST IN TWO WEEKS, PLEASE GIVE US  A CALL AT [phone].

## 2024-11-27 NOTE — Patient Instructions (Signed)
 Pocket of Fluid in the Skin (Epidermoid Cyst): What to Know  An epidermoid cyst is a small lump under your skin. The cyst contains a substance that is thick and oily. What are the causes? A blocked hair follicle. A hair curls and re-enters the skin instead of growing straight out of the skin. A blocked pore. Irritated skin. An injury to the skin. Certain conditions that are passed from parent to child. Human papillomavirus (HPV). This happens rarely when cysts occur on the bottom of the feet. Long-term sun damage to the skin. What increases the risk? Having acne. Being female. Having an injury to the skin. Being past puberty. Certain conditions that are passed down through family (genetic disorder). What are the signs or symptoms? The only sign of this type of cyst may be a small, painless lump under the skin. These cysts are usually painless, but they can get infected. Symptoms of infection may include: Redness. Inflammation. Tenderness. Warmth. Fever. A bad-smelling substance that drains from the cyst. Pus that drains from the cyst. How is this treated? If a cyst becomes inflamed, treatment may include: Opening and draining the cyst. Antibiotics. Shots of medicines called steroids that help lessen inflammation. Surgery to remove the cyst if it's large, painful, or could turn into cancer. Do not try to open or squeeze a cyst yourself. Follow these instructions at home: Medicines Take your medicines only as told. If you were given antibiotics, take them as told. Do not stop taking them even if you start to feel better. General instructions Keep the area around your cyst clean and dry. Wear loose, dry clothing. Avoid touching your cyst. Check your cyst every day for signs of infection. Check for: Redness, swelling, or pain. Fluid or blood. Warmth. Pus or a bad smell. Keep all follow-up visits to make sure there's no discomfort or infection. Contact a doctor if: You have  any signs of infection. Your cyst doesn't get better or gets worse. You get a cyst that looks different from other cysts you've had. You have a fever. You have redness that spreads from the cyst. This information is not intended to replace advice given to you by your health care provider. Make sure you discuss any questions you have with your health care provider. Document Revised: 06/09/2023 Document Reviewed: 06/09/2023 Elsevier Patient Education  2024 [employer].

## 2024-11-28 LAB — CMP14+EGFR
ALT: 9 IU/L (ref 0–32)
AST: 11 IU/L (ref 0–40)
Albumin: 4.3 g/dL (ref 4.0–5.0)
Alkaline Phosphatase: 55 IU/L (ref 41–116)
BUN/Creatinine Ratio: 8 — ABNORMAL LOW (ref 9–23)
BUN: 6 mg/dL (ref 6–20)
Bilirubin Total: 0.3 mg/dL (ref 0.0–1.2)
CO2: 19 mmol/L — ABNORMAL LOW (ref 20–29)
Calcium: 8.9 mg/dL (ref 8.7–10.2)
Chloride: 103 mmol/L (ref 96–106)
Creatinine, Ser: 0.71 mg/dL (ref 0.57–1.00)
Globulin, Total: 2.8 g/dL (ref 1.5–4.5)
Glucose: 57 mg/dL — ABNORMAL LOW (ref 70–99)
Potassium: 3.8 mmol/L (ref 3.5–5.2)
Sodium: 139 mmol/L (ref 134–144)
Total Protein: 7.1 g/dL (ref 6.0–8.5)
eGFR: 117 mL/min/1.73

## 2024-11-28 LAB — CBC
Hematocrit: 35.4 % (ref 34.0–46.6)
Hemoglobin: 10.8 g/dL — ABNORMAL LOW (ref 11.1–15.9)
MCH: 27.1 pg (ref 26.6–33.0)
MCHC: 30.5 g/dL — ABNORMAL LOW (ref 31.5–35.7)
MCV: 89 fL (ref 79–97)
Platelets: 366 x10E3/uL (ref 150–450)
RBC: 3.98 x10E6/uL (ref 3.77–5.28)
RDW: 13.8 % (ref 11.7–15.4)
WBC: 6.3 x10E3/uL (ref 3.4–10.8)

## 2024-11-28 LAB — IRON,TIBC AND FERRITIN PANEL
Ferritin: 26 ng/mL (ref 15–150)
Iron Saturation: 14 % — ABNORMAL LOW (ref 15–55)
Iron: 32 ug/dL (ref 27–159)
Total Iron Binding Capacity: 237 ug/dL — ABNORMAL LOW (ref 250–450)
UIBC: 205 ug/dL (ref 131–425)

## 2024-11-29 ENCOUNTER — Encounter: Payer: Self-pay | Admitting: Internal Medicine

## 2024-12-03 ENCOUNTER — Ambulatory Visit: Payer: Self-pay | Admitting: Internal Medicine

## 2024-12-03 MED ORDER — SULFAMETHOXAZOLE-TRIMETHOPRIM 800-160 MG PO TABS: 1.0000 | ORAL_TABLET | Freq: Two times a day (BID) | ORAL | 0 refills | Status: AC

## 2024-12-03 NOTE — Assessment & Plan Note (Signed)
 Chronic, fair control.  Goal BP <120/80.    She will continue with amlodipine  5mg  daily. Encouraged to follow low sodium diet.  - Encouraged to incorporate more exercise into her daily routine.

## 2024-12-03 NOTE — Assessment & Plan Note (Signed)
 Chronic, needs regular Hematology follow up.

## 2024-12-09 ENCOUNTER — Encounter

## 2024-12-09 ENCOUNTER — Other Ambulatory Visit

## 2024-12-24 ENCOUNTER — Encounter

## 2024-12-24 ENCOUNTER — Other Ambulatory Visit

## 2025-02-05 ENCOUNTER — Ambulatory Visit: Admitting: Dermatology

## 2025-04-10 ENCOUNTER — Encounter: Payer: Self-pay | Admitting: Internal Medicine
